# Patient Record
Sex: Male | Born: 1950 | Race: White | Hispanic: No | Marital: Single | Smoking: Never smoker
Health system: Southern US, Community
[De-identification: ages and names within clinical notes are randomized; demographics above are authoritative.]

## PROBLEM LIST (undated history)

## (undated) DIAGNOSIS — M1712 Unilateral primary osteoarthritis, left knee: Secondary | ICD-10-CM

## (undated) DIAGNOSIS — I10 Essential (primary) hypertension: Secondary | ICD-10-CM

## (undated) DIAGNOSIS — K635 Polyp of colon: Secondary | ICD-10-CM

## (undated) DIAGNOSIS — E785 Hyperlipidemia, unspecified: Secondary | ICD-10-CM

## (undated) DIAGNOSIS — I6350 Cerebral infarction due to unspecified occlusion or stenosis of unspecified cerebral artery: Secondary | ICD-10-CM

## (undated) DIAGNOSIS — J302 Other seasonal allergic rhinitis: Secondary | ICD-10-CM

## (undated) DIAGNOSIS — H269 Unspecified cataract: Secondary | ICD-10-CM

## (undated) DIAGNOSIS — K625 Hemorrhage of anus and rectum: Secondary | ICD-10-CM

## (undated) DIAGNOSIS — J45909 Unspecified asthma, uncomplicated: Secondary | ICD-10-CM

## (undated) DIAGNOSIS — M1990 Unspecified osteoarthritis, unspecified site: Secondary | ICD-10-CM

## (undated) DIAGNOSIS — K5790 Diverticulosis of intestine, part unspecified, without perforation or abscess without bleeding: Secondary | ICD-10-CM

## (undated) DIAGNOSIS — E119 Type 2 diabetes mellitus without complications: Secondary | ICD-10-CM

## (undated) DIAGNOSIS — Z9889 Other specified postprocedural states: Secondary | ICD-10-CM

## (undated) HISTORY — DX: Hyperlipidemia, unspecified: E78.5

## (undated) HISTORY — DX: Essential (primary) hypertension: I10

## (undated) HISTORY — DX: Unspecified cataract: H26.9

## (undated) HISTORY — DX: Hemorrhage of anus and rectum: K62.5

## (undated) HISTORY — PX: EYE SURGERY: SHX253

## (undated) HISTORY — PX: COLONOSCOPY: SHX174

## (undated) HISTORY — DX: Type 2 diabetes mellitus without complications: E11.9

## (undated) HISTORY — DX: Other seasonal allergic rhinitis: J30.2

## (undated) HISTORY — PX: KNEE SURGERY: SHX244

## (undated) HISTORY — DX: Diverticulosis of intestine, part unspecified, without perforation or abscess without bleeding: K57.90

## (undated) HISTORY — DX: Unspecified asthma, uncomplicated: J45.909

## (undated) HISTORY — DX: Polyp of colon: K63.5

## (undated) HISTORY — DX: Cerebral infarction due to unspecified occlusion or stenosis of unspecified cerebral artery: I63.50

## (undated) HISTORY — PX: CATARACT EXTRACTION, BILATERAL: SHX1313

## (undated) HISTORY — PX: FRACTURE SURGERY: SHX138

---

## 2004-07-31 ENCOUNTER — Ambulatory Visit: Payer: Self-pay | Admitting: Endocrinology

## 2004-08-10 ENCOUNTER — Ambulatory Visit: Payer: Self-pay | Admitting: Endocrinology

## 2005-08-18 ENCOUNTER — Ambulatory Visit: Payer: Self-pay | Admitting: Endocrinology

## 2005-09-17 ENCOUNTER — Ambulatory Visit: Payer: Self-pay | Admitting: Endocrinology

## 2005-10-10 DIAGNOSIS — I6350 Cerebral infarction due to unspecified occlusion or stenosis of unspecified cerebral artery: Secondary | ICD-10-CM

## 2005-10-10 HISTORY — DX: Cerebral infarction due to unspecified occlusion or stenosis of unspecified cerebral artery: I63.50

## 2005-10-11 ENCOUNTER — Inpatient Hospital Stay (HOSPITAL_COMMUNITY): Admission: EM | Admit: 2005-10-11 | Discharge: 2005-10-13 | Payer: Self-pay | Admitting: Emergency Medicine

## 2005-10-11 ENCOUNTER — Ambulatory Visit (HOSPITAL_COMMUNITY): Admission: RE | Admit: 2005-10-11 | Discharge: 2005-10-11 | Payer: Self-pay | Admitting: Family Medicine

## 2005-10-12 ENCOUNTER — Encounter (INDEPENDENT_AMBULATORY_CARE_PROVIDER_SITE_OTHER): Payer: Self-pay | Admitting: Neurology

## 2005-10-12 ENCOUNTER — Ambulatory Visit: Payer: Self-pay | Admitting: Internal Medicine

## 2005-10-12 ENCOUNTER — Encounter: Payer: Self-pay | Admitting: Cardiovascular Disease

## 2005-10-12 ENCOUNTER — Ambulatory Visit: Payer: Self-pay | Admitting: Cardiovascular Disease

## 2005-10-12 HISTORY — PX: TRANSTHORACIC ECHOCARDIOGRAM: SHX275

## 2005-10-18 ENCOUNTER — Ambulatory Visit: Payer: Self-pay | Admitting: Internal Medicine

## 2005-10-27 ENCOUNTER — Ambulatory Visit: Payer: Self-pay | Admitting: Endocrinology

## 2005-11-05 ENCOUNTER — Ambulatory Visit: Payer: Self-pay

## 2005-11-05 HISTORY — PX: OTHER SURGICAL HISTORY: SHX169

## 2005-12-06 ENCOUNTER — Ambulatory Visit: Payer: Self-pay | Admitting: Pulmonary Disease

## 2006-08-19 ENCOUNTER — Ambulatory Visit: Payer: Self-pay | Admitting: Endocrinology

## 2006-08-19 LAB — CONVERTED CEMR LAB
AST: 23 units/L (ref 0–37)
Basophils Absolute: 0 10*3/uL (ref 0.0–0.1)
Basophils Relative: 0.3 % (ref 0.0–1.0)
Bilirubin, Direct: 0.1 mg/dL (ref 0.0–0.3)
CO2: 27 meq/L (ref 19–32)
Chloride: 106 meq/L (ref 96–112)
Cholesterol: 112 mg/dL (ref 0–200)
Creatinine, Ser: 1 mg/dL (ref 0.4–1.5)
Eosinophils Relative: 6.6 % — ABNORMAL HIGH (ref 0.0–5.0)
Glucose, Bld: 114 mg/dL — ABNORMAL HIGH (ref 70–99)
HCT: 37.4 % — ABNORMAL LOW (ref 39.0–52.0)
HDL: 38.8 mg/dL — ABNORMAL LOW (ref >39.0)
Hemoglobin, Urine: NEGATIVE
Hemoglobin: 12.8 g/dL — ABNORMAL LOW (ref 13.0–17.0)
Leukocytes, UA: NEGATIVE
MCHC: 34.3 g/dL (ref 30.0–36.0)
Monocytes Absolute: 1 10*3/uL — ABNORMAL HIGH (ref 0.2–0.7)
Neutrophils Relative %: 57.6 % (ref 43.0–77.0)
Nitrite: NEGATIVE
PSA: 0.96 ng/mL (ref 0.10–4.00)
Potassium: 4.5 meq/L (ref 3.5–5.1)
RBC: 3.85 M/uL — ABNORMAL LOW (ref 4.22–5.81)
RDW: 12.5 % (ref 11.5–14.6)
Sodium: 141 meq/L (ref 135–145)
TSH: 2.37 microintl units/mL (ref 0.35–5.50)
Total Bilirubin: 0.6 mg/dL (ref 0.3–1.2)
Total Protein: 7.1 g/dL (ref 6.0–8.3)
Urobilinogen, UA: 0.2 (ref 0.0–1.0)
VLDL: 10 mg/dL (ref 0–40)
WBC: 7.1 10*3/uL (ref 4.5–10.5)
pH: 6 (ref 5.0–8.0)

## 2006-08-24 ENCOUNTER — Ambulatory Visit: Payer: Self-pay | Admitting: Endocrinology

## 2006-08-24 LAB — CONVERTED CEMR LAB
Basophils Absolute: 0 10*3/uL (ref 0.0–0.1)
Basophils Relative: 0 % (ref 0.0–1.0)
Hemoglobin: 12.4 g/dL — ABNORMAL LOW (ref 13.0–17.0)
Hgb A1c MFr Bld: 6.4 % — ABNORMAL HIGH (ref 4.6–6.0)
Iron: 87 ug/dL (ref 42–165)
MCHC: 34 g/dL (ref 30.0–36.0)
Monocytes Absolute: 0.9 10*3/uL — ABNORMAL HIGH (ref 0.2–0.7)
Monocytes Relative: 14 % — ABNORMAL HIGH (ref 3.0–11.0)
Platelets: 325 10*3/uL (ref 150–400)
RDW: 12.8 % (ref 11.5–14.6)
Transferrin: 316.7 mg/dL (ref >=212.0)
Vitamin B-12: 281 pg/mL (ref 211–911)

## 2006-10-08 ENCOUNTER — Encounter: Payer: Self-pay | Admitting: Endocrinology

## 2006-10-08 DIAGNOSIS — I10 Essential (primary) hypertension: Secondary | ICD-10-CM

## 2006-10-08 DIAGNOSIS — E119 Type 2 diabetes mellitus without complications: Secondary | ICD-10-CM

## 2006-10-08 DIAGNOSIS — J45909 Unspecified asthma, uncomplicated: Secondary | ICD-10-CM

## 2006-10-08 DIAGNOSIS — E785 Hyperlipidemia, unspecified: Secondary | ICD-10-CM

## 2006-10-08 HISTORY — DX: Hyperlipidemia, unspecified: E78.5

## 2006-10-08 HISTORY — DX: Unspecified asthma, uncomplicated: J45.909

## 2006-10-08 HISTORY — DX: Essential (primary) hypertension: I10

## 2006-10-08 HISTORY — DX: Type 2 diabetes mellitus without complications: E11.9

## 2007-08-14 ENCOUNTER — Encounter: Payer: Self-pay | Admitting: Endocrinology

## 2007-08-21 ENCOUNTER — Ambulatory Visit: Payer: Self-pay | Admitting: Endocrinology

## 2007-08-23 LAB — CONVERTED CEMR LAB
ALT: 19 units/L (ref 0–53)
AST: 19 units/L (ref 0–37)
Albumin: 3.8 g/dL (ref 3.5–5.2)
Alkaline Phosphatase: 69 units/L (ref 39–117)
BUN: 13 mg/dL (ref 6–23)
Basophils Relative: 0.7 % (ref 0.0–1.0)
CO2: 28 meq/L (ref 19–32)
Chloride: 111 meq/L (ref 96–112)
Creatinine, Ser: 1.1 mg/dL (ref 0.4–1.5)
Eosinophils Relative: 6.3 % — ABNORMAL HIGH (ref 0.0–5.0)
Glucose, Bld: 115 mg/dL — ABNORMAL HIGH (ref 70–99)
HCT: 37.4 % — ABNORMAL LOW (ref 39.0–52.0)
HDL: 36.4 mg/dL — ABNORMAL LOW (ref >39.0)
MCV: 96.8 fL (ref 78.0–100.0)
Monocytes Relative: 12 % (ref 3.0–12.0)
Neutrophils Relative %: 57.6 % (ref 43.0–77.0)
PSA: 0.82 ng/mL (ref 0.10–4.00)
Platelets: 294 10*3/uL (ref 150–400)
Potassium: 4.3 meq/L (ref 3.5–5.1)
RBC: 3.86 M/uL — ABNORMAL LOW (ref 4.22–5.81)
Total CHOL/HDL Ratio: 4.1
Total Protein: 7.1 g/dL (ref 6.0–8.3)
VLDL: 8 mg/dL (ref 0–40)
WBC: 6.4 10*3/uL (ref 4.5–10.5)

## 2007-08-28 ENCOUNTER — Ambulatory Visit: Payer: Self-pay | Admitting: Endocrinology

## 2007-09-04 ENCOUNTER — Telehealth (INDEPENDENT_AMBULATORY_CARE_PROVIDER_SITE_OTHER): Payer: Self-pay | Admitting: *Deleted

## 2007-09-13 ENCOUNTER — Ambulatory Visit: Payer: Self-pay | Admitting: Internal Medicine

## 2007-09-25 ENCOUNTER — Ambulatory Visit: Payer: Self-pay | Admitting: Internal Medicine

## 2007-09-25 ENCOUNTER — Encounter: Payer: Self-pay | Admitting: Internal Medicine

## 2007-09-26 ENCOUNTER — Encounter: Payer: Self-pay | Admitting: Internal Medicine

## 2007-12-06 ENCOUNTER — Telehealth (INDEPENDENT_AMBULATORY_CARE_PROVIDER_SITE_OTHER): Payer: Self-pay | Admitting: *Deleted

## 2008-01-18 ENCOUNTER — Ambulatory Visit: Payer: Self-pay | Admitting: Endocrinology

## 2008-09-02 ENCOUNTER — Ambulatory Visit: Payer: Self-pay | Admitting: Endocrinology

## 2008-09-02 ENCOUNTER — Telehealth: Payer: Self-pay | Admitting: Internal Medicine

## 2008-09-03 LAB — CONVERTED CEMR LAB
ALT: 19 units/L (ref 0–53)
AST: 24 units/L (ref 0–37)
BUN: 24 mg/dL — ABNORMAL HIGH (ref 6–23)
Basophils Relative: 0.8 % (ref 0.0–3.0)
Bilirubin, Direct: 0.1 mg/dL (ref 0.0–0.3)
CO2: 25 meq/L (ref 19–32)
Calcium: 9.1 mg/dL (ref 8.4–10.5)
Creatinine, Ser: 1.2 mg/dL (ref 0.4–1.5)
Eosinophils Relative: 4.6 % (ref 0.0–5.0)
Glucose, Bld: 114 mg/dL — ABNORMAL HIGH (ref 70–99)
HCT: 38 % — ABNORMAL LOW (ref 39.0–52.0)
HDL: 31.4 mg/dL — ABNORMAL LOW (ref >39.00)
Ketones, ur: NEGATIVE mg/dL
Lymphs Abs: 1.7 10*3/uL (ref 0.7–4.0)
Monocytes Relative: 14.1 % — ABNORMAL HIGH (ref 3.0–12.0)
Platelets: 232 10*3/uL (ref 150.0–400.0)
RBC: 3.95 M/uL — ABNORMAL LOW (ref 4.22–5.81)
Sodium: 143 meq/L (ref 135–145)
Specific Gravity, Urine: >=1.03 (ref 1.000–1.030)
TSH: 1.88 microintl units/mL (ref 0.35–5.50)
Total Bilirubin: 0.6 mg/dL (ref 0.3–1.2)
Total CHOL/HDL Ratio: 4
Urine Glucose: NEGATIVE mg/dL
WBC: 5.8 10*3/uL (ref 4.5–10.5)
pH: 6 (ref 5.0–8.0)

## 2008-09-10 ENCOUNTER — Ambulatory Visit: Payer: Self-pay | Admitting: Endocrinology

## 2008-09-12 ENCOUNTER — Encounter: Payer: Self-pay | Admitting: Endocrinology

## 2008-12-25 ENCOUNTER — Telehealth: Payer: Self-pay | Admitting: Endocrinology

## 2009-07-18 ENCOUNTER — Encounter: Payer: Self-pay | Admitting: Endocrinology

## 2009-08-14 ENCOUNTER — Telehealth: Payer: Self-pay | Admitting: Endocrinology

## 2009-08-15 ENCOUNTER — Telehealth: Payer: Self-pay | Admitting: Endocrinology

## 2009-08-26 ENCOUNTER — Encounter: Payer: Self-pay | Admitting: Endocrinology

## 2009-09-08 ENCOUNTER — Ambulatory Visit: Payer: Self-pay | Admitting: Endocrinology

## 2009-09-08 LAB — CONVERTED CEMR LAB
AST: 24 units/L (ref 0–37)
Albumin: 4.2 g/dL (ref 3.5–5.2)
Basophils Absolute: 0 10*3/uL (ref 0.0–0.1)
Bilirubin Urine: NEGATIVE
CO2: 28 meq/L (ref 19–32)
Chloride: 107 meq/L (ref 96–112)
Creatinine,U: 177.6 mg/dL
Eosinophils Absolute: 0.3 10*3/uL (ref 0.0–0.7)
Glucose, Bld: 121 mg/dL — ABNORMAL HIGH (ref 70–99)
HCT: 40.3 % (ref 39.0–52.0)
HDL: 33.2 mg/dL — ABNORMAL LOW (ref >39.00)
Hemoglobin: 14 g/dL (ref 13.0–17.0)
Hgb A1c MFr Bld: 6.3 % (ref 4.6–6.5)
Lymphs Abs: 1.9 10*3/uL (ref 0.7–4.0)
MCHC: 34.8 g/dL (ref 30.0–36.0)
Microalb Creat Ratio: 0.3 mg/g (ref 0.0–30.0)
Microalb, Ur: 0.5 mg/dL (ref 0.0–1.9)
Monocytes Relative: 11.8 % (ref 3.0–12.0)
Neutro Abs: 3.9 10*3/uL (ref 1.4–7.7)
Nitrite: NEGATIVE
Potassium: 4.7 meq/L (ref 3.5–5.1)
RDW: 13.2 % (ref 11.5–14.6)
Sodium: 140 meq/L (ref 135–145)
Specific Gravity, Urine: 1.025 (ref 1.000–1.030)
TSH: 2.48 microintl units/mL (ref 0.35–5.50)
Total Protein, Urine: NEGATIVE mg/dL
pH: 5.5 (ref 5.0–8.0)

## 2009-09-15 ENCOUNTER — Encounter: Payer: Self-pay | Admitting: Endocrinology

## 2009-09-15 ENCOUNTER — Ambulatory Visit: Payer: Self-pay | Admitting: Endocrinology

## 2009-09-15 ENCOUNTER — Encounter (INDEPENDENT_AMBULATORY_CARE_PROVIDER_SITE_OTHER): Payer: Self-pay | Admitting: *Deleted

## 2009-09-15 DIAGNOSIS — K625 Hemorrhage of anus and rectum: Secondary | ICD-10-CM

## 2009-09-15 HISTORY — DX: Hemorrhage of anus and rectum: K62.5

## 2009-09-17 ENCOUNTER — Telehealth: Payer: Self-pay | Admitting: Endocrinology

## 2009-11-11 ENCOUNTER — Telehealth: Payer: Self-pay | Admitting: Endocrinology

## 2009-11-11 DIAGNOSIS — I6350 Cerebral infarction due to unspecified occlusion or stenosis of unspecified cerebral artery: Secondary | ICD-10-CM | POA: Insufficient documentation

## 2009-11-12 ENCOUNTER — Encounter: Payer: Self-pay | Admitting: Endocrinology

## 2010-03-11 NOTE — Medication Information (Signed)
Summary: Approved Actos / [employer]  Approved Actos / [employer]aked[employer]   Imported By: Lennie Odor 08/27/2009 11:07:11  _____________________________________________________________________  External Attachment:    Type:   Image     Comment:   External Document

## 2010-03-11 NOTE — Medication Information (Signed)
Summary: [employer]   Imported By: Sherian Rein 08/18/2009 11:45:39  _____________________________________________________________________  External Attachment:    Type:   Image     Comment:   External Document

## 2010-03-11 NOTE — Progress Notes (Signed)
Summary: Statement  Phone Note Call from Patient Call back at Home Phone [phone]   Caller: Patient Summary of Call: Pt is applying for Health Insurance and company has requested a statement from MD that last saw pt verifying medical Dx; Asthma, DM, HTN and stroke. Initial call taken by: Margaret Pyle, CMA,  November 11, 2009 9:59 AM  Follow-up for Phone Call        done Follow-up by: Minus Breeding MD,  November 12, 2009 8:01 AM  Additional Follow-up for Phone Call Additional follow up Details #1::        Pt advised, Letter up front for pt pick up Additional Follow-up by: Margaret Pyle, CMA,  November 12, 2009 8:15 AM  New Problems: CVA (ICD-434.91)   New Problems: CVA (ICD-434.91)

## 2010-03-11 NOTE — Progress Notes (Signed)
Summary: ALT med  Phone Note Call from Patient Call back at Home Phone [phone]   Caller: Patient Summary of Call: Pt called stating that Actos is too expensive to pay out of pocket since he has lost his health Insurance. Pt is requesting alternate medication or generic? please advise. Initial call taken by: Margaret Pyle, CMA,  August 15, 2009 11:00 AM  Follow-up for Phone Call        i has been a while since last ov.  ov would be necessary to advise you.  cheaper meds can often be prescribed Follow-up by: Minus Breeding MD,  August 15, 2009 12:45 PM  Additional Follow-up for Phone Call Additional follow up Details #1::        Pt advised and stated that he has appt scheduled for Aug 8th Additional Follow-up by: Margaret Pyle, CMA,  August 15, 2009 1:40 PM

## 2010-03-11 NOTE — Letter (Signed)
Summary: New Patient letter  [facility]  [address] [place], [place] [address]   Phone: [phone]  Fax: [phone]       09/15/2009 MRN: [id number]  [facility] [address] [place], [place]  [address]  Dear Mr. Lineman,  Welcome to the Gastroenterology Division at [facility].    You are scheduled to see Dr.  Marina Goodell on 11-04-09 at 11:00a.m. on the 3rd floor at [facility], [address]  We ask that you try to arrive at our office 15 minutes prior to your appointment time to allow for check-in.  We would like you to complete the enclosed self-administered evaluation form prior to your visit and bring it with you on the day of your appointment.  We will review it with you.  Also, please bring a complete list of all your medications or, if you prefer, bring the medication bottles and we will list them.  Please bring your insurance card so that we may make a copy of it.  If your insurance requires a referral to see a specialist, please bring your referral form from your primary care physician.  Co-payments are due at the time of your visit and may be paid by cash, check or credit card.     Your office visit will consist of a consult with your physician (includes a physical exam), any laboratory testing he/she may order, scheduling of any necessary diagnostic testing (e.g. x-ray, ultrasound, CT-scan), and scheduling of a procedure (e.g. Endoscopy, Colonoscopy) if required.  Please allow enough time on your schedule to allow for any/all of these possibilities.    If you cannot keep your appointment, please call [phone] to cancel or reschedule prior to your appointment date.  This allows [place] the opportunity to schedule an appointment for another patient in need of care.  If you do not cancel or reschedule by 5 p.m. the business day prior to your appointment date, you will be charged a $50.00 late cancellation/no-show fee.    Thank you for  choosing [facility] for your medical needs.  We appreciate the opportunity to care for you.  Please visit [place] at our website  to learn more about our practice.                     Sincerely,                                                             The Gastroenterology Division

## 2010-03-11 NOTE — Assessment & Plan Note (Signed)
Summary: CPX/ NWS  #   Vital Signs:  Patient profile:   60 year old male Height:      70 inches (177.80 cm) Weight:      242 pounds (110.00 kg) BMI:     34.85 O2 Sat:      97 % on Room air Temp:     99.6 degrees F (37.56 degrees C) oral Pulse rate:   86 / minute BP sitting:   128 / 78  (left arm) Cuff size:   large  Vitals Entered By: Brenton Grills MA (September 15, 2009 8:11 AM)  O2 Flow:  Room air CC: Physical/aj Is Patient Diabetic? Yes   CC:  Physical/aj.  History of Present Illness: here for regular wellness examination.  He's feeling pretty well in general, and does not drink or smoke.   Current Medications (verified): 1)  Accolate 20 Mg  Tabs (Zafirlukast) .... Take 1 By Mouth Two Times A Day Qd 2)  Actos 15 Mg  Tabs (Pioglitazone Hcl) .... Take 1 By Mouth Qd 3)  Cardizem Cd 120 Mg  Cp24 (Diltiazem Hcl Coated Beads) .... Take 1 By Mouth Qd 4)  Aspirin 325 Mg  Tbec (Aspirin) .... Take 1 By Mouth Qd 5)  Zocor 80 Mg  Tabs (Simvastatin) .... Qhs 6)  Advair Diskus 250-50 Mcg/dose  Misc (Fluticasone-Salmeterol) .... Bid 7)  Cozaar 100 Mg Tabs (Losartan Potassium) .Marland Kitchen.. 1 Qd  Allergies (verified): No Known Drug Allergies  Family History: Reviewed history from 08/28/2007 and no changes required. brother has lung cancer dtr has breast cancer  Social History: Reviewed history from 08/28/2007 and no changes required. works at [employer] single  Review of Systems       The patient complains of weight gain.  The patient denies fever, vision loss, decreased hearing, chest pain, syncope, dyspnea on exertion, prolonged cough, headaches, abdominal pain, melena, severe indigestion/heartburn, hematuria, suspicious skin lesions, and depression.    Physical Exam  General:  obese.  no distress  Head:  head: no deformity eyes: no periorbital swelling, no proptosis external nose and ears are normal mouth: no lesion seen Neck:  Supple without thyroid enlargement or tenderness.    Lungs:  Clear to auscultation bilaterally. Normal respiratory effort.  Abdomen:  abdomen is soft, nontender.  no hepatosplenomegaly.   not distended.  no hernia  Prostate:  Normal size prostate without masses or tenderness.  Msk:  muscle bulk and strength are grossly normal.  no obvious joint swelling.  gait is normal and steady  Pulses:  dorsalis pedis intact bilat.  no carotid bruit  Extremities:  no deformity.  no ulcer on the feet.  feet are of normal color and temp.  no edema  Neurologic:  cn 2-12 grossly intact.   readily moves all 4's.   sensation is intact to touch on the feet  Skin:  normal texture and temp.  no rash.  not diaphoretic  Cervical Nodes:  No significant adenopathy.  Psych:  Alert and cooperative; normal mood and affect; normal attention span and concentration.   Additional Exam:  SEPARATE EVALUATION FOLLOWS--EACH PROBLEM HERE IS NEW, NOT RESPONDING TO TREATMENT, OR POSES SIGNIFICANT RISK TO THE PATIENT'S HEALTH: HISTORY OF THE PRESENT ILLNESS: heme pos stool is noted today. pt takes cardizem and zocor. PAST MEDICAL HISTORY reviewed and up to date today REVIEW OF SYSTEMS: denies brbpr PHYSICAL EXAMINATION: see vs page reg rate and rhythm. no murmur stool is heme pos LAB/XRAY RESULTS: Hemoglobin  14.0 g/dL                   16.1-09.6 Hematocrit                40.3 %                      39.0-52.0 IMPRESSION: drug interaction between cardizem and zocor heme pos stool PLAN: see instruction sheet.   Impression & Recommendations:  Problem # 1:  ROUTINE GENERAL MEDICAL EXAM@HEALTH  CARE FACL (ICD-V70.0)  Medications Added to Medication List This Visit: 1)  Accolate 20 Mg Tabs (Zafirlukast) .... Take 1 by mouth two times a day 2)  Diltiazem Hcl 60 Mg Tabs (Diltiazem hcl) .Marland Kitchen.. 1 tab two times a day 3)  Simvastatin 40 Mg Tabs (Simvastatin) .Marland Kitchen.. 1 once daily 4)  Simvastatin 10 Mg Tabs (Simvastatin) .Marland Kitchen.. 1 tab at bedtime  Other Orders: EKG  w/ Interpretation (93000) Gastroenterology Referral (GI) Est. Patient Level III ([address]) Est. Patient 40-64 years ([address])  Patient Instructions: 1)  here are some samples of "symbicort"-160 (similar to advair) 2)  simvastatin is cheapest a [facility] 3)  cozaar is cheapest at [facility]. 4)  change diltiazem to immediate-release 60 mg two times a day 5)  reduce simvastatin to 40 mg once daily 6)  please consider these measures for your health:  minimize alcohol.  do not use tobacco products.  have a colonoscopy at least every 10 years from age 81.  keep firearms safely stored.  always use seat belts.  have working smoke alarms in your home.  see an eye doctor and dentist regularly.  never drive under the influence of alcohol or drugs (including prescription drugs).  those with fair skin should take precautions against the sun. 7)  please let me know what your wishes would be, if artificial life support measures should become necessary.   it is critically important to prevent falling down (keep floor areas well-lit, dry, and free of loose objects). 8)  Please schedule a follow-up appointment in 6 months. 9)  refer back to dr Marina Goodell.  you will be called with a day and time for an appointment 10)  (update:  we discussed code status.  pt requests full code, but would not want to be started or maintained on artificial life-support measures if there was not a reasonable chance of recovery). Prescriptions: SIMVASTATIN 10 MG TABS (SIMVASTATIN) 1 tab at bedtime  #30 x 11   Entered and Authorized by:   Minus Breeding MD   Signed by:   Minus Breeding MD on 09/17/2009   Method used:   Electronically to        [facility] [address] (retail)       [phone]       [place], [place]  [address]       Ph: [phone]       Fax: [phone]   RxID:   [id number] ACCOLATE 20 MG  TABS (ZAFIRLUKAST) take 1 by mouth two times a day  #180 x 3   Entered and Authorized by:    Minus Breeding MD   Signed by:   Minus Breeding MD on 09/15/2009   Method used:   Print then Give to Patient   RxID:   [id number] SIMVASTATIN 40 MG TABS (SIMVASTATIN) 1 once daily  #90 x 3   Entered and Authorized by:   Minus Breeding  MD   Signed by:   Minus Breeding MD on 09/15/2009   Method used:   Print then Give to Patient   RxID:   [id number] DILTIAZEM HCL 60 MG TABS (DILTIAZEM HCL) 1 tab two times a day  #180 x 3   Entered and Authorized by:   Minus Breeding MD   Signed by:   Minus Breeding MD on 09/15/2009   Method used:   Print then Give to Patient   RxID:   [id number] COZAAR 100 MG TABS (LOSARTAN POTASSIUM) 1 qd  #90 x 3   Entered and Authorized by:   Minus Breeding MD   Signed by:   Minus Breeding MD on 09/15/2009   Method used:   Print then Give to Patient   RxID:   [id number] ADVAIR DISKUS 250-50 MCG/DOSE  MISC (FLUTICASONE-SALMETEROL) bid  #3 x 3   Entered and Authorized by:   Minus Breeding MD   Signed by:   Minus Breeding MD on 09/15/2009   Method used:   Print then Give to Patient   RxID:   [id number] ACTOS 15 MG  TABS (PIOGLITAZONE HCL) take 1 by mouth qd  #90 x 3   Entered and Authorized by:   Minus Breeding MD   Signed by:   Minus Breeding MD on 09/15/2009   Method used:   Print then Give to Patient   RxID:   [id number]

## 2010-03-11 NOTE — Medication Information (Signed)
Summary: Actos/[employer]  Actos/[employer]aked[employer]   Imported By: Sherian Rein 08/18/2009 11:48:43  _____________________________________________________________________  External Attachment:    Type:   Image     Comment:   External Document

## 2010-03-11 NOTE — Letter (Signed)
Summary: Generic Letter  [facility]  [address] [place], [place] [address]   Phone: [phone]  Fax: [phone]    11/12/2009  [facility] [address] [place], [place]  [address]  Dear Mr. Cipriani,   This is to verify that I last saw you in the office on September 15, 2009.  You have a medical history remarkable for diabetes, cerebrovascular accident, hypertension, asthma, and dyslipidemia.     Sincerely,   Romero Belling MD

## 2010-03-11 NOTE — Progress Notes (Signed)
Summary: Simvastatin  Phone Note Outgoing Call   Call placed by: Brenton Grills MA,  September 17, 2009 2:31 PM Call placed to: Patient Details for Reason: Medication Change Summary of Call: Pt informed about dosage change to Simvastatin due to interaction with Diltiazem. Pt is aware of new dosage (10mg ) and that new rx was sent in.

## 2010-03-11 NOTE — Medication Information (Signed)
Summary: Accolate/[employer]  Accolate/[employer]   Imported By: Sherian Rein 08/18/2009 11:47:09  _____________________________________________________________________  External Attachment:    Type:   Image     Comment:   External Document

## 2010-03-11 NOTE — Progress Notes (Signed)
Summary: Refills  Phone Note Outgoing Call   Summary of Call: Patient notified that MD will not sign paperwork for Lipitor and Accupril. Patient was trying to receive those drugs due having been laid off and having an indemnity plan that does not cover prescriptions. Per request will send patient prescriptions to [facility] where he could possibly get them cheaper. Initial call taken by: Lucious Groves,  August 14, 2009 2:58 PM    Prescriptions: COZAAR 100 MG TABS (LOSARTAN POTASSIUM) 1 qd  #100 x 0   Entered by:   Lucious Groves   Authorized by:   Minus Breeding MD   Signed by:   Lucious Groves on 08/14/2009   Method used:   Faxed to ...       [facility] (retail)       [address]       [place], [place]  [address]       Ph: [phone]       Fax: [phone]   RxID:   [id number] ZOCOR 80 MG  TABS (SIMVASTATIN) qhs  #100 x 0   Entered by:   Lucious Groves   Authorized by:   Minus Breeding MD   Signed by:   Lucious Groves on 08/14/2009   Method used:   Faxed to ...       [facility] (retail)       [address]       [place], [place]  [id number]       Ph: [phone]       Fax: [phone]   RxID:   [id number] ACTOS 15 MG  TABS (PIOGLITAZONE HCL) take 1 by mouth qd  #100 x 0   Entered by:   Lucious Groves   Authorized by:   Minus Breeding MD   Signed by:   Lucious Groves on 08/14/2009   Method used:   Faxed to ...       [facility] (retail)       [address]       [place], [place]  [address]       Ph: [phone]       Fax: [phone]   RxID:   [id number]

## 2010-05-01 ENCOUNTER — Other Ambulatory Visit: Payer: Self-pay | Admitting: Endocrinology

## 2010-05-01 DIAGNOSIS — E785 Hyperlipidemia, unspecified: Secondary | ICD-10-CM

## 2010-06-17 ENCOUNTER — Other Ambulatory Visit: Payer: Self-pay | Admitting: *Deleted

## 2010-06-17 MED ORDER — PIOGLITAZONE HCL 15 MG PO TABS: 15.0000 mg | ORAL_TABLET | Freq: Every day | ORAL | Status: DC

## 2010-06-17 NOTE — Telephone Encounter (Signed)
Pt left message on triage requesting refill of Actos

## 2010-06-18 ENCOUNTER — Other Ambulatory Visit: Payer: Self-pay

## 2010-06-18 MED ORDER — ZAFIRLUKAST 20 MG PO TABS: 20.0000 mg | ORAL_TABLET | Freq: Two times a day (BID) | ORAL | Status: DC

## 2010-06-26 NOTE — H&P (Signed)
NAME:  EDEN, RHO         ACCOUNT NO.:  [id number]   MEDICAL RECORD NO.:  [id number]          PATIENT TYPE:  EMS   LOCATION:  [facility]                         FACILITY:  [facility]   PHYSICIAN:  Barbette Hair. Artist Pais, DO      DATE OF BIRTH:  [birth year]   DATE OF ADMISSION:  10/11/2005  DATE OF DISCHARGE:                                HISTORY & PHYSICAL   CHIEF COMPLAINT:  Right-sided weakness.   HISTORY OF PRESENT ILLNESS:  The patient is a 60 year old white, right-  handed male with past medical history of type 2 diabetes, hypertension, and  dyslipidemia who presents with right-sided weakness.  His symptoms started  while working Friday afternoon.  The patient delivers snack foods and  noticed a decreased ability writing with his right hand.  The patient was  not overly concerned and eventually sought evaluation at [facility] today and was advised to obtain an urgent CT scan of the head.  CT  of the head performed at [facility] showed a small hypodensity in the left  mid frontal opercular region with extension into the left mid corona  radiata, acute versus subacute.  The patient did not notice any slurred  speech or facial droop.  His girlfriend noticed the patient may have been  dragging his right leg/foot.  The patient denies any other associated  symptoms.  No chest pain, no shortness of breath.  He was in his usual state  of health prior to this episode.  No GI symptoms.  No headache, no changes  in vision.   The patient states that his diabetes has been fairly well controlled.  He  follows up with Dr. Everardo All regularly.   PAST MEDICAL HISTORY:  1. Type 2 diabetes 3-4 years.  2. Hypertension.  3. Dyslipidemia.  4. Asthma.  5. History of laser eye surgery.   SOCIAL HISTORY:  The patient is divorced, lives with his girlfriend.  His  daughter is accompanying him today.  He works delivering snack foods.  Denies any history of tobacco use, no alcohol use.   FAMILY HISTORY:  Positive for type 2 diabetes in mother and father.  His  father is deceased, died in his 32s.  Father was also hypotensive.  Mother  is alive at age 38, has a history of CHF and knee replacement.  His older  brother is known to have lung cancer.   CURRENT MEDICATIONS:  1. Diovan 320 mg once a day.  2. Actos unknown dose.  3. Advair 5/50 one dose daily.  4. Accolate 20 mg b.i.d.  5. Lovastatin unknown dose.   ALLERGIES:  NONE KNOWN.   LABORATORY DATA:  CBC:  WBC 10.2, hemoglobin 14.5, hematocrit 42.6,  platelets 311, PT 13.6, INR 1.  Comprehensive metabolic profile showed  sodium 138, potassium 3.8, chloride 106, CO2 26, BUN 12, creatinine 0.9,  blood sugar 91.  LFTs were normal.  The patient had normal CPK but mildly  elevated troponin I of 0.3, urinalysis was unremarkable.   REVIEW OF SYSTEMS:  As noted above, all other systems negative.   PHYSICAL EXAMINATION:  VITAL SIGNS:  Temperature 98.3, pulse 77,  respirations 16, blood pressure 165/91.  GENERAL:  The patient is a very pleasant 60 year old white male in no  apparent distress, normal speech, no facial droop.  HEENT:  Normocephalic, atraumatic, pupils were equal and reactive to light  bilaterally, extraocular motility was intact, the patient was anicteric,  normal conjunctivae.  The patient had a corneal abnormality in his right  eye.  NECK:  Supple, no adenopathy, no carotid bruit, no thyromegaly or thyroid  nodules.  CHEST:  Normal respiratory effort.  Chest was clear to auscultation  bilaterally, no rhonchi, rales, or wheezing.  CARDIOVASCULAR:  Regular rate and rhythm, no significant murmurs, rubs, or  gallops appreciated.  ABDOMEN:  Soft, nontender, positive bowel sounds, no organomegaly.  EXTREMITIES:  No clubbing, cyanosis, or edema.  SKIN:  Warm and dry.  The patient had intact pedis dorsalis pulses that were  slightly diminished bilaterally.  NEUROLOGICAL:  Cranial nerves II-XII was grossly  intact.  The patient had  decreased right hand grip, slightly diminished strength of his right hip  flexors.  Reflexes were 1-2 right upper extremity and 0-1 left upper  extremity.  The patient's patellar bilaterally was +2 to 3.  No Babinski.  No cerebellar signs.  No pronator drift.   IMPRESSION/RECOMMENDATIONS:  1. Right-sided weakness, subacute cerebrovascular accident.  2. Mildly elevated troponin.  3. Hypertension.  4. Type 2 diabetes.  5. Dyslipidemia.  6. History of asthma.   RECOMMENDATIONS:  The patient likely had a lacunar stroke and we will  initiate stroke workup including MRI of the brain, carotid Doppler, and 2D  echo.  Check a homocystine.  Repeat his fasting lipid profile and hemoglobin  A1c.  He will be started on aspirin 325 mg once a day but likely he will  need to be transitioned to Aggrenox before discharge.  In addition, if the  fasting lipid profile is suboptimal, we will change Statin to Lipitor 80 mg  p.o. nightly.   In terms of his mildly elevated troponin, may be secondary to his  cerebrovascular accident.  We will cycle cardiac enzymes.  Further  management based on echo results.   Lastly, PT and OT will be consulted and will likely need follow up as  outpatient.      Barbette Hair. Artist Pais, DO  Electronically Signed    RDY/MEDQ  D:  10/11/2005  T:  10/11/2005  Job:  [id number]   cc:   Gregary Signs A. Everardo All, MD

## 2010-06-26 NOTE — Assessment & Plan Note (Signed)
[facility]                                   ON-CALL NOTE   NAME:Edward Hawkins                  MRN:          [id number]  DATE:10/11/2005                            DOB:          01-Oct-1950    PRIMARY CARE [name]:  Dr. Everardo All.  Home office is [place].   DATE OF INTERACTION:  October 11, 2005, at 1:00 p.m.  Caller was Dr. Belva Bertin at [facility].  Phone number is [phone].   OBJECTIVE:  A 60 year old male.  The patient is diabetic with hypertension,  who has right upper extremity symptoms of possible stroke.  Dr. Modena Jansky is  ordering a CT scan of the head and a call will come to me for the results to  follow up.   ASSESSMENT:  Diabetic hypertensive with possible left-sided stroke.   PLAN:  Await CT call.  If the CT is okay, we will have him follow up  tomorrow.  If it is not, we will have him go to the emergency room for  evaluation and possible admission.                                   Arta Silence, MD   RNS/MedQ  DD:  10/11/2005  DT:  10/11/2005  Job #:  [id number]   cc:   Gregary Signs A. Everardo All, MD

## 2010-06-26 NOTE — Discharge Summary (Signed)
NAME:  Edward Hawkins, Edward Hawkins         ACCOUNT NO.:  [id number]   MEDICAL RECORD NO.:  [id number]          PATIENT TYPE:  INP   LOCATION:  6732                         FACILITY:  [facility]   PHYSICIAN:  Valerie A. Felicity Coyer, MDDATE OF BIRTH:  07-26-1950   DATE OF ADMISSION:  10/11/2005  DATE OF DISCHARGE:                                 DISCHARGE SUMMARY   DIAGNOSES AT TIME OF DISCHARGE:  1. 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  2. Diabetes type 2.  3. Mild elevation of troponin.  4. Dyslipidemia.  5. Debilitation.   HISTORY OF PRESENT ILLNESS:  Edward Hawkins is a 60 year old white male  admitted on October 11, 2005 with chief complaint of right-sided weakness.  He underwent a CT of the head which showed a left frontal infarct.  He was  admitted for further evaluation and treatment.   PAST MEDICAL HISTORY:  1. Diabetes type 2.  2. Hypertension.  3. Dyslipidemia.  4. Asthma.   COURSE OF HOSPITALIZATION.:  Problem 1.  Subacute left cerebral vascular  accident with right-sided weakness.  The patient underwent CT head which  showed a left frontal infarct.  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  Homocystine was within normal limits.  TSH was within  normal limits.  A 2-D echo was performed which showed distal septal  hypokinesis.  The patient did have one troponin which was mildly elevated  early in his admission with value of 0.30.  Subsequent troponins were  negative, and he had no cardiac complaints.  However, due to his multiple  risk factors and the distal septal hypokinesis noted on echocardiogram, he  will be set up for an outpatient stress test.   The patient is instructed to continue to work on factor modification  including low cholesterol diabetic diet.  His A1c was 6.2.   However, it was  up from 5.7 in July 2007, so it will need continued outpatient monitoring.  The patient underwent carotid duplex, and preliminary report shows no  stenosis and bilateral mild soft plaques.   Problem 2.  Debilitation.  He was evaluated by PT and OT who recommended  home health PT, OT.  This will be arranged at time of discharge.  The  patient instructed not to drive until cleared by Dr. Everardo All.   PERTINENT LABORATORY DATA:  At discharge, hemoglobin 14.5, hematocrit 42.6,  BUN 12, creatinine 0.9.  TSH 3.571.   DISPOSITION:  Plan to transfer patient to home.   MEDICATIONS AT DISCHARGE:  1. Actos 15 mg p.o. daily.  2. Lovastatin 80 mg p.o. daily.  3. Advair 500/50 one puff twice daily.  4. Enteric-coated aspirin 325 mg p.o. daily.  5. Diovan 320 mg p.o. daily.  6. Accolade 20 mg p.o. b.i.d.   FOLLOW UP:  The patient is to follow up with Dr. Jonny Ruiz on Monday, September  10 at 2:00 p.m., who was covering for Dr. Everardo All.  In addition, he will be  set up  for a stress test by [facility] prior to discharge.  He is  instructed to go the emergency room should he develop chest pain, worsened  weakness, or slurred speech.     ______________________________  Sandford Craze, PA      Raenette Rover. Felicity Coyer, MD  Electronically Signed    MO/MEDQ  D:  10/13/2005  T:  10/13/2005  Job:  [id number]   cc:   Gregary Signs A. Everardo All, MD

## 2010-09-21 ENCOUNTER — Other Ambulatory Visit (INDEPENDENT_AMBULATORY_CARE_PROVIDER_SITE_OTHER): Payer: PRIVATE HEALTH INSURANCE

## 2010-09-21 ENCOUNTER — Other Ambulatory Visit: Payer: Self-pay | Admitting: Endocrinology

## 2010-09-21 DIAGNOSIS — Z0000 Encounter for general adult medical examination without abnormal findings: Secondary | ICD-10-CM

## 2010-09-21 DIAGNOSIS — Z0389 Encounter for observation for other suspected diseases and conditions ruled out: Secondary | ICD-10-CM

## 2010-09-21 LAB — CBC WITH DIFFERENTIAL/PLATELET
Basophils Absolute: 0 10*3/uL (ref 0.0–0.1)
Basophils Relative: 0.4 % (ref 0.0–3.0)
Eosinophils Absolute: 0.2 10*3/uL (ref 0.0–0.7)
Lymphocytes Relative: 32.5 % (ref 12.0–46.0)
MCHC: 33.7 g/dL (ref 30.0–36.0)
Monocytes Relative: 14.6 % — ABNORMAL HIGH (ref 3.0–12.0)
Neutrophils Relative %: 48.6 % (ref 43.0–77.0)
RBC: 4.17 Mil/uL — ABNORMAL LOW (ref 4.22–5.81)

## 2010-09-21 LAB — URINALYSIS
Bilirubin Urine: NEGATIVE
Hgb urine dipstick: NEGATIVE
Ketones, ur: NEGATIVE
Leukocytes, UA: NEGATIVE
Urobilinogen, UA: 0.2 (ref 0.0–1.0)

## 2010-09-21 LAB — BASIC METABOLIC PANEL
CO2: 26 mEq/L (ref 19–32)
Chloride: 103 mEq/L (ref 96–112)
Glucose, Bld: 127 mg/dL — ABNORMAL HIGH (ref 70–99)
Sodium: 137 mEq/L (ref 135–145)

## 2010-09-21 LAB — HEPATIC FUNCTION PANEL
Albumin: 4.4 g/dL (ref 3.5–5.2)
Alkaline Phosphatase: 75 U/L (ref 39–117)
Total Protein: 7.4 g/dL (ref 6.0–8.3)

## 2010-09-21 LAB — LIPID PANEL
HDL: 40.5 mg/dL (ref >39.00)
LDL Cholesterol: 109 mg/dL — ABNORMAL HIGH (ref 0–99)
Total CHOL/HDL Ratio: 4
Triglycerides: 43 mg/dL (ref 0.0–149.0)
VLDL: 8.6 mg/dL (ref 0.0–40.0)

## 2010-09-21 LAB — TSH: TSH: 1.48 u[IU]/mL (ref 0.35–5.50)

## 2010-09-28 ENCOUNTER — Encounter: Payer: Self-pay | Admitting: Endocrinology

## 2010-09-28 ENCOUNTER — Ambulatory Visit (INDEPENDENT_AMBULATORY_CARE_PROVIDER_SITE_OTHER): Payer: PRIVATE HEALTH INSURANCE | Admitting: Endocrinology

## 2010-09-28 VITALS — BP 138/70 | HR 98 | Temp 99.2°F | Ht 71.0 in | Wt 251.8 lb

## 2010-09-28 DIAGNOSIS — E119 Type 2 diabetes mellitus without complications: Secondary | ICD-10-CM

## 2010-09-28 DIAGNOSIS — M25569 Pain in unspecified knee: Secondary | ICD-10-CM

## 2010-09-28 DIAGNOSIS — Z0000 Encounter for general adult medical examination without abnormal findings: Secondary | ICD-10-CM | POA: Insufficient documentation

## 2010-09-28 DIAGNOSIS — E785 Hyperlipidemia, unspecified: Secondary | ICD-10-CM

## 2010-09-28 DIAGNOSIS — Z23 Encounter for immunization: Secondary | ICD-10-CM

## 2010-09-28 DIAGNOSIS — M25562 Pain in left knee: Secondary | ICD-10-CM

## 2010-09-28 MED ORDER — ATORVASTATIN CALCIUM 20 MG PO TABS: 20.0000 mg | ORAL_TABLET | Freq: Every day | ORAL | Status: DC

## 2010-09-28 NOTE — Patient Instructions (Addendum)
please consider these measures for your health:  minimize alcohol.  do not use tobacco products.  have a colonoscopy at least every 10 years from age 60.  keep firearms safely stored.  always use seat belts.  have working smoke alarms in your home.  see an eye doctor and dentist regularly.  never drive under the influence of alcohol or drugs (including prescription drugs).  those with fair skin should take precautions against the sun. please let me know what your wishes would be, if artificial life support measures should become necessary.  it is critically important to prevent falling down (keep floor areas well-lit, dry, and free of loose objects) Please make a follow-up appointment in 6 months. Change simvastatin to atorvastatin 20 mg daily.  i have sent a prescription to your pharmacy. Go to lab in 6 weeks for lab recheck.  then please call [phone] to hear your test results.  You will be prompted to enter the 9-digit "MRN" number that appears at the top left of this page, followed by #.  Then you will hear the message.   Refer for a colonoscopy.  you will receive a phone call, about a day and time for an appointment.   Refer to orthopedics.  you will receive a phone call, about a day and time for an appointment

## 2010-09-28 NOTE — Progress Notes (Signed)
Subjective:    Patient ID: Edward Hawkins, male    DOB: 1950-09-03, 60 y.o.   MRN: [id number]  HPI here for regular wellness examination.  He's feeling pretty well in general, and says chronic med probs are stable, except as noted below. Past Medical History  Diagnosis Date  . DIABETES MELLITUS, TYPE II 10/08/2006  . HYPERLIPIDEMIA 10/08/2006  . HYPERTENSION 10/08/2006  . ASTHMA 10/08/2006  . CVA 11/11/2009  . RECTAL BLEEDING 09/15/2009  . Diverticulosis   . Colon polyps     tubular adenomas    Past Surgical History  Procedure Date  . Stress cardiolite 11/05/2005  . Transthoracic echocardiogram 10/12/2005    History   Social History  . Marital Status: Single    Spouse Name: N/A    Number of Children: N/A  . Years of Education: N/A   Occupational History  . Works at [employer] History Main Topics  . Smoking status: Not on file  . Smokeless tobacco: Not on file  . Alcohol Use:   . Drug Use:   . Sexually Active:    Other Topics Concern  . Not on file   Social History Narrative  . No narrative on file    Current Outpatient Prescriptions on File Prior to Visit  Medication Sig Dispense Refill  . aspirin 325 MG tablet Take 325 mg by mouth daily.        Marland Kitchen diltiazem (CARDIZEM) 60 MG tablet Take 60 mg by mouth 2 (two) times daily.        . Fluticasone-Salmeterol (ADVAIR DISKUS) 250-50 MCG/DOSE AEPB Inhale 1 puff into the lungs every 12 (twelve) hours.        Marland Kitchen losartan (COZAAR) 100 MG tablet Take 100 mg by mouth daily.        . pioglitazone (ACTOS) 15 MG tablet Take 1 tablet (15 mg total) by mouth daily.  30 tablet  2  . simvastatin (ZOCOR) 10 MG tablet TAKE 1 TABLET BY MOUTH DAILY  30 tablet  3  . zafirlukast (ACCOLATE) 20 MG tablet Take 1 tablet (20 mg total) by mouth 2 (two) times daily.  180 tablet  1    Allergies not on file  Family History  Problem Relation Age of Onset  . Cancer Brother     Lung Cancer  . Cancer Daughter     Breast Cancer    There  were no vitals taken for this visit.      Review of Systems  Constitutional: Negative for fever.  HENT: Negative for hearing loss.   Eyes: Negative for visual disturbance.  Respiratory: Negative for shortness of breath.   Cardiovascular: Negative for chest pain.  Gastrointestinal: Negative for abdominal pain.  Genitourinary: Negative for hematuria.  Musculoskeletal:       Chronic bilat leg pain is unchanged  Skin: Negative for rash.  Neurological: Negative for headaches.  Hematological: Does not bruise/bleed easily.  Psychiatric/Behavioral: Negative for dysphoric mood. The patient is not nervous/anxious.        Objective:   Physical Exam VS: see vs page GEN: no distress HEAD: head: no deformity eyes: no periorbital swelling, no proptosis external nose and ears are normal mouth: no lesion seen NECK: supple, thyroid is not enlarged CHEST WALL: no deformity BREASTS:  No gynecomastia CV: reg rate and rhythm, no murmur ABD: abdomen is soft, nontender.  no hepatosplenomegaly.  not distended.  no hernia RECTAL: normal external and internal exam.  heme neg. PROSTATE:  Normal size.  No nodule MUSCULOSKELETAL: muscle bulk and strength are grossly normal.  no obvious joint swelling.  gait is normal and steady EXTEMITIES: no deformity.  no ulcer on the feet.  feet are of normal color and temp.  no edema PULSES: dorsalis pedis intact bilat.  no carotid bruit NEURO:  cn 2-12 grossly intact.   readily moves all 4's.  sensation is intact to touch on the feet SKIN:  Normal texture and temperature.  No rash or suspicious lesion is visible.   NODES:  None palpable at the neck PSYCH: alert, oriented x3.  Does not appear anxious nor depressed.    Assessment & Plan:  Wellness visit today, with problems stable, except as noted.   SEPARATE EVALUATION FOLLOWS--EACH PROBLEM HERE IS NEW, NOT RESPONDING TO TREATMENT, OR POSES SIGNIFICANT RISK TO THE PATIENT'S HEALTH: HISTORY OF THE PRESENT  ILLNESS: Pt states few years of moderate pain at the posterior aspect of the left knee. It is worse in the context of sleeping.  No assoc numbness. He takes zocor as rx'ed. PAST MEDICAL HISTORY reviewed and up to date today REVIEW OF SYSTEMS: Denies weight change and loc PHYSICAL EXAMINATION: VS:  See vs page GENERAL: no distress Ext: left knee is normal to my exam Msk:  Gait is normal and steady LAB/XRAY RESULTS: Lab Results  Component Value Date   CHOL 158 09/21/2010   CHOL 122 09/08/2009   CHOL 111 09/02/2008   Lab Results  Component Value Date   HDL 40.50 09/21/2010   HDL 16.10* 09/08/2009   HDL 96.04* 09/02/2008   Lab Results  Component Value Date   LDLCALC 109* 09/21/2010   LDLCALC 75 09/08/2009   LDLCALC 70 09/02/2008   Lab Results  Component Value Date   TRIG 43.0 09/21/2010   TRIG 69.0 09/08/2009   TRIG 48.0 09/02/2008   Lab Results  Component Value Date   CHOLHDL 4 09/21/2010   CHOLHDL 4 09/08/2009   CHOLHDL 4 09/02/2008  IMPRESSION: Left knee pain, new Dyslipidemia, needs increased rx Htn, well-controlled.  However, there is an interaction between zocor/lipitor and cardizem PLAN: See instruction page

## 2010-09-30 ENCOUNTER — Other Ambulatory Visit: Payer: Self-pay | Admitting: Endocrinology

## 2010-10-16 ENCOUNTER — Encounter: Payer: Self-pay | Admitting: Internal Medicine

## 2010-11-24 ENCOUNTER — Other Ambulatory Visit: Payer: Self-pay | Admitting: Endocrinology

## 2010-12-03 ENCOUNTER — Encounter: Payer: Self-pay | Admitting: Gastroenterology

## 2011-01-05 ENCOUNTER — Other Ambulatory Visit: Payer: Self-pay | Admitting: Endocrinology

## 2011-05-19 ENCOUNTER — Telehealth: Payer: Self-pay | Admitting: *Deleted

## 2011-05-19 DIAGNOSIS — Z0000 Encounter for general adult medical examination without abnormal findings: Secondary | ICD-10-CM

## 2011-05-19 DIAGNOSIS — Z0389 Encounter for observation for other suspected diseases and conditions ruled out: Secondary | ICD-10-CM

## 2011-05-19 DIAGNOSIS — E119 Type 2 diabetes mellitus without complications: Secondary | ICD-10-CM

## 2011-05-19 NOTE — Telephone Encounter (Signed)
CPX labs placed into Epic for upcoming appointment. 

## 2011-05-19 NOTE — Telephone Encounter (Signed)
Message copied by Carin Primrose on Wed May 19, 2011  2:59 PM ------      Message from: Newell Coral      Created: Mon Apr 26, 2011  2:22 PM      Regarding: cpe sch, needs labs       This pt has scheduled their cpe and is hoping to get labs done too. Thanks!

## 2011-06-14 ENCOUNTER — Other Ambulatory Visit: Payer: Self-pay | Admitting: Endocrinology

## 2011-09-21 ENCOUNTER — Other Ambulatory Visit (INDEPENDENT_AMBULATORY_CARE_PROVIDER_SITE_OTHER): Payer: PRIVATE HEALTH INSURANCE

## 2011-09-21 DIAGNOSIS — E119 Type 2 diabetes mellitus without complications: Secondary | ICD-10-CM

## 2011-09-21 DIAGNOSIS — Z0000 Encounter for general adult medical examination without abnormal findings: Secondary | ICD-10-CM

## 2011-09-21 DIAGNOSIS — Z0389 Encounter for observation for other suspected diseases and conditions ruled out: Secondary | ICD-10-CM

## 2011-09-21 LAB — URINALYSIS, ROUTINE W REFLEX MICROSCOPIC
Hgb urine dipstick: NEGATIVE
Nitrite: NEGATIVE
Specific Gravity, Urine: >=1.03 (ref 1.000–1.030)
Total Protein, Urine: NEGATIVE
Urine Glucose: NEGATIVE
Urobilinogen, UA: 0.2 (ref 0.0–1.0)

## 2011-09-21 LAB — HEPATIC FUNCTION PANEL
ALT: 18 U/L (ref 0–53)
Bilirubin, Direct: 0.1 mg/dL (ref 0.0–0.3)
Total Bilirubin: 0.4 mg/dL (ref 0.3–1.2)

## 2011-09-21 LAB — BASIC METABOLIC PANEL
BUN: 14 mg/dL (ref 6–23)
CO2: 24 mEq/L (ref 19–32)
Chloride: 103 mEq/L (ref 96–112)
GFR: 78.76 mL/min (ref >60.00)
Glucose, Bld: 105 mg/dL — ABNORMAL HIGH (ref 70–99)
Potassium: 4.6 mEq/L (ref 3.5–5.1)
Sodium: 136 mEq/L (ref 135–145)

## 2011-09-21 LAB — CBC WITH DIFFERENTIAL/PLATELET
Basophils Relative: 0.4 % (ref 0.0–3.0)
Eosinophils Relative: 2.9 % (ref 0.0–5.0)
Lymphocytes Relative: 28.1 % (ref 12.0–46.0)
MCV: 99.9 fl (ref 78.0–100.0)
Monocytes Relative: 12.5 % — ABNORMAL HIGH (ref 3.0–12.0)
Neutrophils Relative %: 56.1 % (ref 43.0–77.0)
Platelets: 282 10*3/uL (ref 150.0–400.0)
RBC: 4.15 Mil/uL — ABNORMAL LOW (ref 4.22–5.81)
WBC: 6.2 10*3/uL (ref 4.5–10.5)

## 2011-09-21 LAB — HEMOGLOBIN A1C: Hgb A1c MFr Bld: 6.3 % (ref 4.6–6.5)

## 2011-09-21 LAB — TSH: TSH: 2.86 u[IU]/mL (ref 0.35–5.50)

## 2011-09-21 LAB — LIPID PANEL
HDL: 38.6 mg/dL — ABNORMAL LOW (ref >39.00)
LDL Cholesterol: 83 mg/dL (ref 0–99)
VLDL: 13.8 mg/dL (ref 0.0–40.0)

## 2011-09-29 ENCOUNTER — Encounter: Payer: Self-pay | Admitting: Endocrinology

## 2011-09-29 ENCOUNTER — Ambulatory Visit (INDEPENDENT_AMBULATORY_CARE_PROVIDER_SITE_OTHER): Payer: Self-pay | Admitting: Endocrinology

## 2011-09-29 VITALS — BP 138/80 | HR 90 | Temp 98.2°F | Ht 71.0 in | Wt 247.0 lb

## 2011-09-29 DIAGNOSIS — E119 Type 2 diabetes mellitus without complications: Secondary | ICD-10-CM

## 2011-09-29 DIAGNOSIS — Z0000 Encounter for general adult medical examination without abnormal findings: Secondary | ICD-10-CM

## 2011-09-29 MED ORDER — ZAFIRLUKAST 20 MG PO TABS: ORAL_TABLET | ORAL | Status: DC

## 2011-09-29 MED ORDER — FLUTICASONE-SALMETEROL 250-50 MCG/DOSE IN AEPB: INHALATION_SPRAY | RESPIRATORY_TRACT | Status: DC

## 2011-09-29 MED ORDER — PIOGLITAZONE HCL 15 MG PO TABS: ORAL_TABLET | ORAL | Status: DC

## 2011-09-29 MED ORDER — DILTIAZEM HCL 60 MG PO TABS: ORAL_TABLET | ORAL | Status: DC

## 2011-09-29 MED ORDER — ATORVASTATIN CALCIUM 20 MG PO TABS: 20.0000 mg | ORAL_TABLET | Freq: Every day | ORAL | Status: DC

## 2011-09-29 MED ORDER — LOSARTAN POTASSIUM 100 MG PO TABS: ORAL_TABLET | ORAL | Status: DC

## 2011-09-29 NOTE — Progress Notes (Signed)
Subjective:    Patient ID: Edward Hawkins, male    DOB: 1950/06/25, 61 y.o.   MRN: [id number]  HPI here for regular wellness examination.  He's feeling pretty well in general, and says chronic med probs are stable, except as noted below Past Medical History  Diagnosis Date  . DIABETES MELLITUS, TYPE II 10/08/2006  . HYPERLIPIDEMIA 10/08/2006  . HYPERTENSION 10/08/2006  . ASTHMA 10/08/2006  . CVA 11/11/2009  . RECTAL BLEEDING 09/15/2009  . Diverticulosis   . Colon polyps     tubular adenomas    Past Surgical History  Procedure Date  . Stress cardiolite 11/05/2005  . Transthoracic echocardiogram 10/12/2005    History   Social History  . Marital Status: Single    Spouse Name: N/A    Number of Children: N/A  . Years of Education: N/A   Occupational History  . Works at [employer] History Main Topics  . Smoking status: Never Smoker   . Smokeless tobacco: Not on file  . Alcohol Use: Not on file  . Drug Use: Not on file  . Sexually Active: Not on file   Other Topics Concern  . Not on file   Social History Narrative  . No narrative on file    Current Outpatient Prescriptions on File Prior to Visit  Medication Sig Dispense Refill  . aspirin 325 MG tablet Take 325 mg by mouth daily.        Marland Kitchen DISCONTD: ADVAIR DISKUS 250-50 MCG/DOSE AEPB USE 1 INHALATION TWICE A DAY  60 each  3  . DISCONTD: diltiazem (CARDIZEM) 60 MG tablet TAKE 1 TABLET BY MOUTH TWICE A DAY  180 tablet  3  . DISCONTD: losartan (COZAAR) 100 MG tablet TAKE 1 TABLET BY MOUTH EVERY DAY  90 tablet  3  . DISCONTD: pioglitazone (ACTOS) 15 MG tablet TAKE 1 TABLET BY MOUTH ONCE A DAY  30 tablet  8  . DISCONTD: zafirlukast (ACCOLATE) 20 MG tablet TAKE 1 TABLET BY MOUTH TWICE A DAY  180 tablet  0  . DISCONTD: atorvastatin (LIPITOR) 20 MG tablet Take 1 tablet (20 mg total) by mouth daily.  30 tablet  11  . DISCONTD: Fluticasone-Salmeterol (ADVAIR DISKUS) 250-50 MCG/DOSE AEPB Inhale 1 puff into the lungs every 12  (twelve) hours.          No Known Allergies  Family History  Problem Relation Age of Onset  . Cancer Brother     Lung Cancer  . Cancer Daughter     Breast Cancer    BP 138/80  Pulse 90  Temp 98.2 F (36.8 C) (Oral)  Ht 5\' 11"  (1.803 m)  Wt 247 lb (112.038 kg)  BMI 34.45 kg/m2  SpO2 98%     Review of Systems  Constitutional: Negative for fever and unexpected weight change.  HENT: Negative for hearing loss.   Eyes: Negative for visual disturbance.  Respiratory: Negative for shortness of breath.   Cardiovascular: Negative for chest pain.  Gastrointestinal: Negative for anal bleeding.  Genitourinary: Negative for hematuria and difficulty urinating.  Musculoskeletal: Negative for back pain.  Skin: Negative for rash.  Neurological: Negative for syncope, numbness and headaches.  Hematological: Does not bruise/bleed easily.  Psychiatric/Behavioral: Negative for dysphoric mood.       Objective:   Physical Exam VS: see vs page GEN: no distress HEAD: head: no deformity eyes: no periorbital swelling, no proptosis external nose and ears are normal mouth: no lesion seen NECK: supple, thyroid is not  enlarged CHEST WALL: no deformity LUNGS: clear to auscultation BREASTS:  No gynecomastia CV: reg rate and rhythm, no murmur ABD: abdomen is soft, nontender.  no hepatosplenomegaly.  not distended.  no hernia. RECTAL: normal external and internal exam.  heme neg. PROSTATE:  Normal size.  No nodule MUSCULOSKELETAL: muscle bulk and strength are grossly normal.  no obvious joint swelling.  gait is normal and steady EXTEMITIES: no deformity.  no ulcer on the feet.  feet are of normal color and temp.  no edema PULSES: dorsalis pedis intact bilat.  no carotid bruit NEURO:  cn 2-12 grossly intact.   readily moves all 4's.  sensation is intact to touch on the feet SKIN:  Normal texture and temperature.  No rash or suspicious lesion is visible.   NODES:  None palpable at the  neck PSYCH: alert, oriented x3.  Does not appear anxious nor depressed.     Assessment & Plan:  Wellness visit today, with problems stable, except as noted.

## 2011-09-29 NOTE — Patient Instructions (Addendum)
please consider these measures for your health:  minimize alcohol.  do not use tobacco products.  have a colonoscopy at least every 10 years from age 62.  keep firearms safely stored.  always use seat belts.  have working smoke alarms in your home.  see an eye doctor and dentist regularly.  never drive under the influence of alcohol or drugs (including prescription drugs).  those with fair skin should take precautions against the sun.   Please return in 1 year.   good diet and exercise habits significanly improve the control of your diabetes.  please let me know if you wish to be referred to a dietician.  high blood sugar is very risky to your health.  you should see an eye doctor every year.  You are at higher than average risk for pneumonia and hepatitis-B.  You should be vaccinated against both.   you will receive a phone call, about a day and time for an appointment, for a colonoscopy

## 2011-10-01 ENCOUNTER — Other Ambulatory Visit: Payer: Self-pay | Admitting: Endocrinology

## 2011-10-01 MED ORDER — BUDESONIDE-FORMOTEROL FUMARATE 160-4.5 MCG/ACT IN AERO: 2.0000 | INHALATION_SPRAY | Freq: Two times a day (BID) | RESPIRATORY_TRACT | Status: DC

## 2011-11-02 ENCOUNTER — Ambulatory Visit (AMBULATORY_SURGERY_CENTER): Payer: PRIVATE HEALTH INSURANCE

## 2011-11-02 ENCOUNTER — Encounter: Payer: Self-pay | Admitting: Internal Medicine

## 2011-11-02 VITALS — Ht 71.0 in | Wt 244.9 lb

## 2011-11-02 DIAGNOSIS — Z1211 Encounter for screening for malignant neoplasm of colon: Secondary | ICD-10-CM

## 2011-11-02 DIAGNOSIS — Z86010 Personal history of colonic polyps: Secondary | ICD-10-CM

## 2011-11-02 MED ORDER — MOVIPREP 100 G PO SOLR: ORAL | Status: DC

## 2011-11-16 ENCOUNTER — Encounter: Payer: Self-pay | Admitting: Internal Medicine

## 2011-11-16 ENCOUNTER — Ambulatory Visit (AMBULATORY_SURGERY_CENTER): Payer: PRIVATE HEALTH INSURANCE | Admitting: Internal Medicine

## 2011-11-16 VITALS — BP 143/62 | HR 81 | Temp 98.2°F | Resp 18 | Ht 71.0 in | Wt 244.0 lb

## 2011-11-16 DIAGNOSIS — Z86010 Personal history of colonic polyps: Secondary | ICD-10-CM

## 2011-11-16 DIAGNOSIS — Z1211 Encounter for screening for malignant neoplasm of colon: Secondary | ICD-10-CM

## 2011-11-16 DIAGNOSIS — D126 Benign neoplasm of colon, unspecified: Secondary | ICD-10-CM

## 2011-11-16 LAB — GLUCOSE, CAPILLARY
Glucose-Capillary: 109 mg/dL — ABNORMAL HIGH (ref 70–99)
Glucose-Capillary: 111 mg/dL — ABNORMAL HIGH (ref 70–99)

## 2011-11-16 MED ORDER — SODIUM CHLORIDE 0.9 % IV SOLN: 500.0000 mL | INTRAVENOUS | Status: DC

## 2011-11-16 NOTE — Op Note (Signed)
[facility] [address] [place], [address]   COLONOSCOPY PROCEDURE REPORT  PATIENT: Edward Hawkins, Edward Hawkins  MR#: [id number] BIRTHDATE: [birth year] , 61  yrs. old GENDER: Male ENDOSCOPIST: Roxy Cedar, MD REFERRED GE:[id number] Program Recall PROCEDURE DATE:  11/16/2011 PROCEDURE:   Colonoscopy with snare polypectomy    x 4 ASA CLASS:   Class III INDICATIONS:High risk screening; patient's personal history of adenomatous colon polyps (index 09-2007 w/ 3 adenomas). MEDICATIONS: MAC sedation, administered by CRNA and propofol (Diprivan) 450mg  IV  DESCRIPTION OF PROCEDURE:   After the risks benefits and alternatives of the procedure were thoroughly explained, informed consent was obtained.  A digital rectal exam revealed no abnormalities of the rectum.   The LB CF-[id number]  endoscope was introduced through the anus and advanced to the cecum, which was identified by both the appendix and ileocecal valve. No adverse events experienced.   The quality of the prep was good, using MoviPrep  The instrument was then slowly withdrawn as the colon was fully examined.      COLON FINDINGS: Two sessile polyps measuring 5 and11 mm in size were found at the cecum.  A polypectomy was performed with a cold snare. The resection was complete and the polyp tissue was completely retrieved.   Two polyps measuring 5 mm in size were found in the transverse colon.  A polypectomy was performed with a cold snare. The resection was complete and the polyp tissue was completely retrieved.   Moderate diverticulosis was noted in the sigmoid colon.  Retroflexed views revealed internal hemorrhoids. The time to cecum=1 minutes 40 seconds.  Withdrawal time=16 minutes 17 seconds.  The scope was withdrawn and the procedure completed. COMPLICATIONS: There were no complications.  ENDOSCOPIC IMPRESSION: 1.   Two sessile polyps measuring 5,11 mm in size were found at the cecum; polypectomy  was performed with a cold snare 2.   Two polyps measuring 5 mm in size were found in the transverse colon; polypectomy was performed with a cold snare 3.   Moderate diverticulosis was noted in the sigmoid colon  RECOMMENDATIONS: 1.Repeat Colonoscopy in 3 years.   eSigned:  Roxy Cedar, MD 11/16/2011 8:39 AM  cc: Minus Breeding, MD and The Patient   PATIENT NAME:  Edward Hawkins, Edward Hawkins MR#: [id number]

## 2011-11-16 NOTE — Progress Notes (Addendum)
Patient did not have preoperative order for IV antibiotic SSI prophylaxis. (G8918)  Patient did not experience any of the following events: a burn prior to discharge; a fall within the facility; wrong site/side/patient/procedure/implant event; or a hospital transfer or hospital admission upon discharge from the facility. (G8907)  

## 2011-11-16 NOTE — Patient Instructions (Addendum)
YOU HAD AN ENDOSCOPIC PROCEDURE TODAY AT THE [facility]: Refer to the procedure report that was given to you for any specific questions about what was found during the examination.  If the procedure report does not answer your questions, please call your gastroenterologist to clarify.  If you requested that your care partner not be given the details of your procedure findings, then the procedure report has been included in a sealed envelope for you to review at your convenience later.  YOU SHOULD EXPECT: Some feelings of bloating in the abdomen. Passage of more gas than usual.  Walking can help get rid of the air that was put into your GI tract during the procedure and reduce the bloating. If you had a lower endoscopy (such as a colonoscopy or flexible sigmoidoscopy) you may notice spotting of blood in your stool or on the toilet paper. If you underwent a bowel prep for your procedure, then you may not have a normal bowel movement for a few days.  DIET: Your first meal following the procedure should be a light meal and then it is ok to progress to your normal diet.  A half-sandwich or bowl of soup is an example of a good first meal.  Heavy or fried foods are harder to digest and may make you feel nauseous or bloated.  Likewise meals heavy in dairy and vegetables can cause extra gas to form and this can also increase the bloating.  Drink plenty of fluids but you should avoid alcoholic beverages for 24 hours.  ACTIVITY: Your care partner should take you home directly after the procedure.  You should plan to take it easy, moving slowly for the rest of the day.  You can resume normal activity the day after the procedure however you should NOT DRIVE or use heavy machinery for 24 hours (because of the sedation medicines used during the test).    SYMPTOMS TO REPORT IMMEDIATELY: A gastroenterologist can be reached at any hour.  During normal business hours, 8:30 AM to 5:00 PM Monday through Friday,  call [phone].  After hours and on weekends, please call the GI answering service at [phone] who will take a message and have the physician on call contact you.   Following lower endoscopy (colonoscopy or flexible sigmoidoscopy):  Excessive amounts of blood in the stool  Significant tenderness or worsening of abdominal pains  Swelling of the abdomen that is new, acute  Fever of 100F or higher  FOLLOW UP: If any biopsies were taken you will be contacted by phone or by letter within the next 1-3 weeks.  Call your gastroenterologist if you have not heard about the biopsies in 3 weeks.  Our staff will call the home number listed on your records the next business day following your procedure to check on you and address any questions or concerns that you may have at that time regarding the information given to you following your procedure. This is a courtesy call and so if there is no answer at the home number and we have not heard from you through the emergency physician on call, we will assume that you have returned to your regular daily activities without incident.  SIGNATURES/CONFIDENTIALITY: You and/or your care partner have signed paperwork which will be entered into your electronic medical record.  These signatures attest to the fact that that the information above on your After Visit Summary has been reviewed and is understood.  Full responsibility of the confidentiality of this   discharge information lies with you and/or your care-partner.   Thank-you for choosing us for your healthcare needs. 

## 2011-11-17 ENCOUNTER — Telehealth: Payer: Self-pay

## 2011-11-17 NOTE — Telephone Encounter (Signed)
Left message on answering machine. 

## 2011-11-22 ENCOUNTER — Encounter: Payer: Self-pay | Admitting: Internal Medicine

## 2011-12-16 ENCOUNTER — Other Ambulatory Visit: Payer: Self-pay | Admitting: Endocrinology

## 2011-12-16 ENCOUNTER — Other Ambulatory Visit: Payer: Self-pay | Admitting: *Deleted

## 2011-12-16 MED ORDER — FLUTICASONE-SALMETEROL 250-50 MCG/DOSE IN AEPB: 1.0000 | INHALATION_SPRAY | Freq: Every day | RESPIRATORY_TRACT | Status: DC

## 2011-12-16 NOTE — Telephone Encounter (Signed)
Medication refill request.

## 2011-12-22 ENCOUNTER — Other Ambulatory Visit: Payer: Self-pay | Admitting: Endocrinology

## 2011-12-22 DIAGNOSIS — J45909 Unspecified asthma, uncomplicated: Secondary | ICD-10-CM

## 2011-12-24 ENCOUNTER — Institutional Professional Consult (permissible substitution): Payer: PRIVATE HEALTH INSURANCE | Admitting: Pulmonary Disease

## 2012-02-25 ENCOUNTER — Telehealth: Payer: Self-pay | Admitting: Endocrinology

## 2012-02-25 MED ORDER — FLUTICASONE-SALMETEROL 250-50 MCG/DOSE IN AEPB: 1.0000 | INHALATION_SPRAY | Freq: Every day | RESPIRATORY_TRACT | Status: DC

## 2012-02-25 NOTE — Telephone Encounter (Signed)
The patient called to report that he has new insurance and needs a new rx for Advair Diskus 250/50 mcg/dose sent to the [facility] on [address].  The patient may be reached at [phone] if needed.

## 2012-04-10 ENCOUNTER — Telehealth: Payer: Self-pay | Admitting: Endocrinology

## 2012-04-10 MED ORDER — ATORVASTATIN CALCIUM 20 MG PO TABS: 20.0000 mg | ORAL_TABLET | Freq: Every day | ORAL | Status: DC

## 2012-04-10 MED ORDER — LOSARTAN POTASSIUM 100 MG PO TABS: ORAL_TABLET | ORAL | Status: DC

## 2012-04-10 MED ORDER — PIOGLITAZONE HCL 15 MG PO TABS: ORAL_TABLET | ORAL | Status: DC

## 2012-04-25 ENCOUNTER — Encounter: Payer: Self-pay | Admitting: Endocrinology

## 2012-05-01 ENCOUNTER — Encounter: Payer: Self-pay | Admitting: Endocrinology

## 2012-05-01 ENCOUNTER — Ambulatory Visit (INDEPENDENT_AMBULATORY_CARE_PROVIDER_SITE_OTHER): Payer: BC Managed Care – PPO | Admitting: Endocrinology

## 2012-05-01 ENCOUNTER — Telehealth: Payer: Self-pay | Admitting: Endocrinology

## 2012-05-01 VITALS — BP 134/78 | HR 79 | Temp 98.7°F | Wt 236.0 lb

## 2012-05-01 DIAGNOSIS — J069 Acute upper respiratory infection, unspecified: Secondary | ICD-10-CM

## 2012-05-01 MED ORDER — PROMETHAZINE-CODEINE 6.25-10 MG/5ML PO SYRP: 5.0000 mL | ORAL_SOLUTION | ORAL | Status: DC | PRN

## 2012-05-01 NOTE — Progress Notes (Signed)
  Subjective:    Patient ID: Edward Hawkins, male    DOB: February 11, 1950, 62 y.o.   MRN: [id number]  HPI Pt states 1 week of slightly prod-prod quality cough in the chest.  He had assoc fever, but that is resolved.   Past Medical History  Diagnosis Date  . DIABETES MELLITUS, TYPE II 10/08/2006  . HYPERLIPIDEMIA 10/08/2006  . HYPERTENSION 10/08/2006  . ASTHMA 10/08/2006  . CVA 11/11/2009  . RECTAL BLEEDING 09/15/2009  . Diverticulosis   . Colon polyps     tubular adenomas    Past Surgical History  Procedure Laterality Date  . Stress cardiolite  11/05/2005  . Transthoracic echocardiogram  10/12/2005  . Knee surgery      left knee    History   Social History  . Marital Status: Single    Spouse Name: N/A    Number of Children: N/A  . Years of Education: N/A   Occupational History  . Works at [employer] History Main Topics  . Smoking status: Never Smoker   . Smokeless tobacco: Never Used  . Alcohol Use: No  . Drug Use: No  . Sexually Active: Not on file   Other Topics Concern  . Not on file   Social History Narrative  . No narrative on file    Current Outpatient Prescriptions on File Prior to Visit  Medication Sig Dispense Refill  . aspirin 325 MG tablet Take 325 mg by mouth daily.        . budesonide-formoterol (SYMBICORT) 160-4.5 MCG/ACT inhaler Inhale 2 puffs into the lungs 2 (two) times daily.  1 Inhaler  12  . diltiazem (CARDIZEM) 60 MG tablet TAKE 1 TABLET BY MOUTH TWICE A DAY  180 tablet  3  . fish oil-omega-3 fatty acids 1000 MG capsule Take 1 g by mouth daily.      . Fluticasone-Salmeterol (ADVAIR DISKUS) 250-50 MCG/DOSE AEPB Inhale 1 puff into the lungs daily.  60 each  3  . MOVIPREP 100 G SOLR       . Multiple Vitamin (MULTIVITAMIN) tablet [facility] MVI 50 plus-Take one daily      . traMADol (ULTRAM) 50 MG tablet 1 to 2 tablets by mouth every 6 hours as needed      . zafirlukast (ACCOLATE) 20 MG tablet TAKE 1 TABLET BY MOUTH TWICE A DAY  180 tablet  3    No current facility-administered medications on file prior to visit.    No Known Allergies  Family History  Problem Relation Age of Onset  . Cancer Brother     Lung Cancer  . Cancer Daughter     Breast Cancer  . Diabetes Mother   . Diabetes Father     BP 134/78  Pulse 79  Temp(Src) 98.7 F (37.1 C) (Oral)  Wt 236 lb (107.049 kg)  BMI 32.93 kg/m2  SpO2 97%  Review of Systems He has little if any wheezing.  No nasal congestion.      Objective:   Physical Exam VITAL SIGNS:  See vs page GENERAL: no distress head: no deformity eyes: no periorbital swelling, no proptosis external nose and ears are normal mouth: no lesion seen Both eac's and tm's are normal LUNGS:  Clear to auscultation.     Assessment & Plan:  URI, new

## 2012-05-01 NOTE — Telephone Encounter (Signed)
Please advise ov 

## 2012-05-01 NOTE — Telephone Encounter (Signed)
The patient called stating he has a nagging cough and is hoping to get a cough medicine called into his pharmacy.

## 2012-05-01 NOTE — Patient Instructions (Signed)
Here is a prescription for cough syrup. I hope you feel better soon.  If you don't feel better in 2 weeks, please call back.

## 2012-05-02 ENCOUNTER — Other Ambulatory Visit: Payer: Self-pay

## 2012-05-02 MED ORDER — ATORVASTATIN CALCIUM 20 MG PO TABS: 20.0000 mg | ORAL_TABLET | Freq: Every day | ORAL | Status: DC

## 2012-05-02 MED ORDER — LOSARTAN POTASSIUM 100 MG PO TABS: ORAL_TABLET | ORAL | Status: DC

## 2012-05-02 MED ORDER — PIOGLITAZONE HCL 15 MG PO TABS: ORAL_TABLET | ORAL | Status: DC

## 2012-05-02 NOTE — Telephone Encounter (Signed)
Edward Hawkins, can this phone note be closed? EPIC doesn't give me the parameters to close it.

## 2012-05-09 ENCOUNTER — Other Ambulatory Visit: Payer: Self-pay

## 2012-05-09 MED ORDER — DILTIAZEM HCL 60 MG PO TABS: ORAL_TABLET | ORAL | Status: DC

## 2012-09-29 ENCOUNTER — Ambulatory Visit (INDEPENDENT_AMBULATORY_CARE_PROVIDER_SITE_OTHER): Payer: BC Managed Care – PPO | Admitting: Endocrinology

## 2012-09-29 ENCOUNTER — Encounter: Payer: Self-pay | Admitting: Endocrinology

## 2012-09-29 VITALS — BP 136/80 | HR 78 | Ht 71.0 in | Wt 239.0 lb

## 2012-09-29 DIAGNOSIS — E119 Type 2 diabetes mellitus without complications: Secondary | ICD-10-CM

## 2012-09-29 DIAGNOSIS — Z0000 Encounter for general adult medical examination without abnormal findings: Secondary | ICD-10-CM

## 2012-09-29 DIAGNOSIS — Z125 Encounter for screening for malignant neoplasm of prostate: Secondary | ICD-10-CM

## 2012-09-29 DIAGNOSIS — E785 Hyperlipidemia, unspecified: Secondary | ICD-10-CM

## 2012-09-29 DIAGNOSIS — I10 Essential (primary) hypertension: Secondary | ICD-10-CM

## 2012-09-29 LAB — URINALYSIS, ROUTINE W REFLEX MICROSCOPIC
Ketones, ur: NEGATIVE
Leukocytes, UA: NEGATIVE
Nitrite: NEGATIVE
RBC / HPF: NONE SEEN (ref 0–?)
Specific Gravity, Urine: 1.025 (ref 1.000–1.030)
Urobilinogen, UA: 0.2 (ref 0.0–1.0)
WBC, UA: NONE SEEN (ref 0–?)
pH: 6 (ref 5.0–8.0)

## 2012-09-29 LAB — CBC WITH DIFFERENTIAL/PLATELET
Basophils Absolute: 0.1 10*3/uL (ref 0.0–0.1)
Eosinophils Absolute: 0.4 10*3/uL (ref 0.0–0.7)
Hemoglobin: 14.2 g/dL (ref 13.0–17.0)
Lymphocytes Relative: 26.9 % (ref 12.0–46.0)
MCHC: 33.9 g/dL (ref 30.0–36.0)
Monocytes Relative: 12.7 % — ABNORMAL HIGH (ref 3.0–12.0)
Neutro Abs: 4 10*3/uL (ref 1.4–7.7)
Neutrophils Relative %: 54.3 % (ref 43.0–77.0)
RBC: 4.29 Mil/uL (ref 4.22–5.81)
RDW: 13.5 % (ref 11.5–14.6)

## 2012-09-29 LAB — BASIC METABOLIC PANEL
Calcium: 9.5 mg/dL (ref 8.4–10.5)
GFR: 86.25 mL/min (ref >60.00)
Glucose, Bld: 111 mg/dL — ABNORMAL HIGH (ref 70–99)
Potassium: 4.3 mEq/L (ref 3.5–5.1)
Sodium: 139 mEq/L (ref 135–145)

## 2012-09-29 LAB — HEPATIC FUNCTION PANEL
ALT: 31 U/L (ref 0–53)
Albumin: 4.3 g/dL (ref 3.5–5.2)
Alkaline Phosphatase: 87 U/L (ref 39–117)
Bilirubin, Direct: 0 mg/dL (ref 0.0–0.3)
Total Protein: 7.7 g/dL (ref 6.0–8.3)

## 2012-09-29 LAB — MICROALBUMIN / CREATININE URINE RATIO
Creatinine,U: 168.3 mg/dL
Microalb Creat Ratio: 0.3 mg/g (ref 0.0–30.0)
Microalb, Ur: 0.5 mg/dL (ref 0.0–1.9)

## 2012-09-29 LAB — TSH: TSH: 1.95 u[IU]/mL (ref 0.35–5.50)

## 2012-09-29 LAB — HEMOGLOBIN A1C: Hgb A1c MFr Bld: 6.2 % (ref 4.6–6.5)

## 2012-09-29 LAB — LIPID PANEL: Total CHOL/HDL Ratio: 4

## 2012-09-29 MED ORDER — FLUTICASONE-SALMETEROL 250-50 MCG/DOSE IN AEPB: 1.0000 | INHALATION_SPRAY | Freq: Every day | RESPIRATORY_TRACT | Status: DC

## 2012-09-29 NOTE — Patient Instructions (Addendum)
blood tests are being requested for you today.  We'll contact you with results. please consider these measures for your health:  minimize alcohol.  do not use tobacco products.  have a colonoscopy at least every 10 years from age 62.  keep firearms safely stored.  always use seat belts.  have working smoke alarms in your home.  see an eye doctor and dentist regularly.  never drive under the influence of alcohol or drugs (including prescription drugs).  those with fair skin should take precautions against the sun. Please come back for a follow-up appointment in 6 months.    

## 2012-09-29 NOTE — Progress Notes (Signed)
Subjective:    Patient ID: Edward Hawkins, male    DOB: [birth year], 62 y.o.   MRN: [id number]  HPI Pt is here for regular wellness examination, and is feeling pretty well in general, and says chronic med probs are stable, except as noted below Past Medical History  Diagnosis Date  . DIABETES MELLITUS, TYPE II 10/08/2006  . HYPERLIPIDEMIA 10/08/2006  . HYPERTENSION 10/08/2006  . ASTHMA 10/08/2006  . CVA 11/11/2009  . RECTAL BLEEDING 09/15/2009  . Diverticulosis   . Colon polyps     tubular adenomas    Past Surgical History  Procedure Laterality Date  . Stress cardiolite  11/05/2005  . Transthoracic echocardiogram  10/12/2005  . Knee surgery      left knee    History   Social History  . Marital Status: Single    Spouse Name: N/A    Number of Children: N/A  . Years of Education: N/A   Occupational History  . Works at [employer] History Main Topics  . Smoking status: Never Smoker   . Smokeless tobacco: Never Used  . Alcohol Use: No  . Drug Use: No  . Sexual Activity: Not on file   Other Topics Concern  . Not on file   Social History Narrative  . No narrative on file    Current Outpatient Prescriptions on File Prior to Visit  Medication Sig Dispense Refill  . aspirin 325 MG tablet Take 325 mg by mouth daily.        Marland Kitchen atorvastatin (LIPITOR) 20 MG tablet Take 1 tablet (20 mg total) by mouth daily.  90 tablet  3  . budesonide-formoterol (SYMBICORT) 160-4.5 MCG/ACT inhaler Inhale 2 puffs into the lungs 2 (two) times daily.  1 Inhaler  12  . diltiazem (CARDIZEM) 60 MG tablet TAKE 1 TABLET BY MOUTH TWICE A DAY  180 tablet  3  . fish oil-omega-3 fatty acids 1000 MG capsule Take 1 g by mouth daily.      Marland Kitchen losartan (COZAAR) 100 MG tablet TAKE 1 TABLET BY MOUTH EVERY DAY  90 tablet  3  . MOVIPREP 100 G SOLR       . Multiple Vitamin (MULTIVITAMIN) tablet [facility] MVI 50 plus-Take one daily      . pioglitazone (ACTOS) 15 MG tablet TAKE 1 TABLET BY MOUTH ONCE A DAY  90  tablet  3  . traMADol (ULTRAM) 50 MG tablet 1 to 2 tablets by mouth every 6 hours as needed      . zafirlukast (ACCOLATE) 20 MG tablet TAKE 1 TABLET BY MOUTH TWICE A DAY  180 tablet  3   No current facility-administered medications on file prior to visit.    No Known Allergies  Family History  Problem Relation Age of Onset  . Cancer Brother     Lung Cancer  . Cancer Daughter     Breast Cancer  . Diabetes Mother   . Diabetes Father     BP 136/80  Pulse 78  Ht 5\' 11"  (1.803 m)  Wt 239 lb (108.41 kg)  BMI 33.35 kg/m2  SpO2 98%     Review of Systems  Constitutional: Negative for fever and unexpected weight change.  HENT: Negative for hearing loss.   Eyes: Negative for visual disturbance.  Respiratory: Negative for shortness of breath.   Cardiovascular: Negative for chest pain.  Gastrointestinal: Negative for anal bleeding.  Endocrine: Negative for cold intolerance.  Genitourinary: Negative for hematuria and difficulty urinating.  Musculoskeletal:  Negative for back pain.  Skin: Negative for rash.  Allergic/Immunologic: Negative for environmental allergies.  Neurological: Negative for syncope and numbness.  Hematological: Does not bruise/bleed easily.  Psychiatric/Behavioral: Negative for dysphoric mood.       Objective:   Physical Exam VS: see vs page GEN: no distress HEAD: head: no deformity eyes: no periorbital swelling, no proptosis external nose and ears are normal mouth: no lesion seen NECK: supple, thyroid is not enlarged CHEST WALL: no deformity LUNGS: clear to auscultation BREASTS:  No gynecomastia CV: reg rate and rhythm, no murmur ABD: abdomen is soft, nontender.  no hepatosplenomegaly.  not distended.  no hernia RECTAL: normal external and internal exam.  heme neg. PROSTATE:  Normal size.  No nodule MUSCULOSKELETAL: muscle bulk and strength are grossly normal.  no obvious joint swelling.  gait is normal and steady EXTEMITIES: no deformity.  no  ulcer on the feet.  feet are of normal color and temp.  no edema PULSES: dorsalis pedis intact bilat.  no carotid bruit NEURO:  cn 2-12 grossly intact.   readily moves all 4's.  sensation is intact to touch on the feet SKIN:  Normal texture and temperature.  No rash or suspicious lesion is visible.   NODES:  None palpable at the neck PSYCH: alert, oriented x3.  Does not appear anxious nor depressed.       Assessment & Plan:  Wellness visit today, with problems stable, except as noted. we discussed code status.  pt requests full code, but would not want to be started or maintained on artificial life-support measures if there was not a reasonable chance of recovery

## 2012-11-20 ENCOUNTER — Other Ambulatory Visit: Payer: Self-pay | Admitting: Endocrinology

## 2013-04-26 ENCOUNTER — Other Ambulatory Visit: Payer: Self-pay | Admitting: Endocrinology

## 2013-06-18 ENCOUNTER — Other Ambulatory Visit: Payer: Self-pay | Admitting: Endocrinology

## 2013-07-23 LAB — HM DIABETES EYE EXAM

## 2013-07-27 ENCOUNTER — Other Ambulatory Visit: Payer: Self-pay | Admitting: Endocrinology

## 2013-07-30 ENCOUNTER — Encounter: Payer: Self-pay | Admitting: Endocrinology

## 2013-07-31 ENCOUNTER — Telehealth: Payer: Self-pay | Admitting: Endocrinology

## 2013-07-31 MED ORDER — DILTIAZEM HCL 60 MG PO TABS: ORAL_TABLET | ORAL | Status: DC

## 2013-07-31 NOTE — Telephone Encounter (Signed)
Rx sent to pharmacy   

## 2013-07-31 NOTE — Telephone Encounter (Signed)
Pt needs the generic for cardizem called [facility] please

## 2013-08-30 ENCOUNTER — Other Ambulatory Visit: Payer: Self-pay | Admitting: Endocrinology

## 2013-10-01 ENCOUNTER — Ambulatory Visit (INDEPENDENT_AMBULATORY_CARE_PROVIDER_SITE_OTHER): Payer: BC Managed Care – PPO | Admitting: Endocrinology

## 2013-10-01 ENCOUNTER — Encounter: Payer: Self-pay | Admitting: Endocrinology

## 2013-10-01 VITALS — BP 132/74 | HR 85 | Temp 98.4°F | Ht 71.0 in | Wt 248.0 lb

## 2013-10-01 DIAGNOSIS — I10 Essential (primary) hypertension: Secondary | ICD-10-CM

## 2013-10-01 DIAGNOSIS — Z0000 Encounter for general adult medical examination without abnormal findings: Secondary | ICD-10-CM

## 2013-10-01 DIAGNOSIS — Z23 Encounter for immunization: Secondary | ICD-10-CM

## 2013-10-01 DIAGNOSIS — E119 Type 2 diabetes mellitus without complications: Secondary | ICD-10-CM

## 2013-10-01 DIAGNOSIS — E785 Hyperlipidemia, unspecified: Secondary | ICD-10-CM

## 2013-10-01 DIAGNOSIS — Z125 Encounter for screening for malignant neoplasm of prostate: Secondary | ICD-10-CM

## 2013-10-01 LAB — URINALYSIS, ROUTINE W REFLEX MICROSCOPIC
Bilirubin Urine: NEGATIVE
Hgb urine dipstick: NEGATIVE
Ketones, ur: NEGATIVE
LEUKOCYTES UA: NEGATIVE
NITRITE: NEGATIVE
PH: 6 (ref 5.0–8.0)
RBC / HPF: NONE SEEN (ref 0–?)
Specific Gravity, Urine: 1.015 (ref 1.000–1.030)
Total Protein, Urine: NEGATIVE
Urine Glucose: NEGATIVE
Urobilinogen, UA: 0.2 (ref 0.0–1.0)

## 2013-10-01 LAB — HEMOGLOBIN A1C: Hgb A1c MFr Bld: 6.4 % (ref 4.6–6.5)

## 2013-10-01 LAB — MICROALBUMIN / CREATININE URINE RATIO
Creatinine,U: 163.4 mg/dL
MICROALB UR: 1.3 mg/dL (ref 0.0–1.9)
MICROALB/CREAT RATIO: 0.8 mg/g (ref 0.0–30.0)

## 2013-10-01 LAB — LIPID PANEL
Cholesterol: 121 mg/dL (ref 0–200)
HDL: 37.1 mg/dL — ABNORMAL LOW
LDL Cholesterol: 74 mg/dL (ref 0–99)
NonHDL: 83.9
Total CHOL/HDL Ratio: 3
Triglycerides: 52 mg/dL (ref 0.0–149.0)
VLDL: 10.4 mg/dL (ref 0.0–40.0)

## 2013-10-01 LAB — CBC WITH DIFFERENTIAL/PLATELET
Basophils Absolute: 0 10*3/uL (ref 0.0–0.1)
Basophils Relative: 0.5 % (ref 0.0–3.0)
Eosinophils Absolute: 0.3 10*3/uL (ref 0.0–0.7)
Eosinophils Relative: 5.6 % — ABNORMAL HIGH (ref 0.0–5.0)
HCT: 39.2 % (ref 39.0–52.0)
Hemoglobin: 13.1 g/dL (ref 13.0–17.0)
Lymphocytes Relative: 22 % (ref 12.0–46.0)
Lymphs Abs: 1.3 10*3/uL (ref 0.7–4.0)
MCHC: 33.3 g/dL (ref 30.0–36.0)
MCV: 98.1 fl (ref 78.0–100.0)
MONOS PCT: 11.8 % (ref 3.0–12.0)
Monocytes Absolute: 0.7 10*3/uL (ref 0.1–1.0)
NEUTROS PCT: 60.1 % (ref 43.0–77.0)
Neutro Abs: 3.5 10*3/uL (ref 1.4–7.7)
PLATELETS: 261 10*3/uL (ref 150.0–400.0)
RBC: 3.99 Mil/uL — ABNORMAL LOW (ref 4.22–5.81)
RDW: 13.2 % (ref 11.5–15.5)
WBC: 5.8 10*3/uL (ref 4.0–10.5)

## 2013-10-01 LAB — TSH: TSH: 1.48 u[IU]/mL (ref 0.35–4.50)

## 2013-10-01 LAB — BASIC METABOLIC PANEL
BUN: 17 mg/dL (ref 6–23)
CALCIUM: 9 mg/dL (ref 8.4–10.5)
CO2: 24 mEq/L (ref 19–32)
Chloride: 108 mEq/L (ref 96–112)
Creatinine, Ser: 1 mg/dL (ref 0.4–1.5)
GFR: 82.91 mL/min (ref >60.00)
GLUCOSE: 112 mg/dL — AB (ref 70–99)
Potassium: 4 mEq/L (ref 3.5–5.1)
Sodium: 139 mEq/L (ref 135–145)

## 2013-10-01 LAB — HEPATIC FUNCTION PANEL
ALBUMIN: 4 g/dL (ref 3.5–5.2)
ALT: 25 U/L (ref 0–53)
AST: 27 U/L (ref 0–37)
Alkaline Phosphatase: 80 U/L (ref 39–117)
Bilirubin, Direct: 0.1 mg/dL (ref 0.0–0.3)
TOTAL PROTEIN: 7.1 g/dL (ref 6.0–8.3)
Total Bilirubin: 0.6 mg/dL (ref 0.2–1.2)

## 2013-10-01 LAB — PSA: PSA: 0.69 ng/mL (ref 0.10–4.00)

## 2013-10-01 NOTE — Patient Instructions (Signed)
please consider these measures for your health:  minimize alcohol.  do not use tobacco products.  have a colonoscopy at least every 10 years from age 63.  keep firearms safely stored.  always use seat belts.  have working smoke alarms in your home.  see an eye doctor and dentist regularly.  never drive under the influence of alcohol or drugs (including prescription drugs).  those with fair skin should take precautions against the sun. blood tests are being requested for you today.  We'll contact you with results. good diet and exercise habits significanly improve the control of your diabetes.  please let me know if you wish to be referred to a dietician.  high blood sugar is very risky to your health.  you should see an eye doctor and dentist every year.  You are at higher than average risk for pneumonia and hepatitis-B.  You should be vaccinated against both.   controlling your blood pressure and cholesterol drastically reduces the damage diabetes does to your body.  this also applies to quitting smoking.  please discuss these with your doctor.  Please come back for a follow-up appointment in 6 months.

## 2013-10-01 NOTE — Progress Notes (Signed)
Subjective:    Patient ID: Edward Hawkins, male    DOB: 12-22-1950, 63 y.o.   MRN: [id number]  HPI Pt is here for regular wellness examination, and is feeling pretty well in general, and says chronic med probs are stable, except as noted below Past Medical History  Diagnosis Date  . DIABETES MELLITUS, TYPE II 10/08/2006  . HYPERLIPIDEMIA 10/08/2006  . HYPERTENSION 10/08/2006  . ASTHMA 10/08/2006  . CVA 11/11/2009  . RECTAL BLEEDING 09/15/2009  . Diverticulosis   . Colon polyps     tubular adenomas    Past Surgical History  Procedure Laterality Date  . Stress cardiolite  11/05/2005  . Transthoracic echocardiogram  10/12/2005  . Knee surgery      left knee    History   Social History  . Marital Status: Single    Spouse Name: N/A    Number of Children: N/A  . Years of Education: N/A   Occupational History  . Works at [employer]  . Smoking status: Never Smoker   . Smokeless tobacco: Never Used  . Alcohol Use: No  . Drug Use: No  . Sexual Activity: Not on file   Other Topics Concern  . Not on file   Social History Narrative  . No narrative on file    Current Outpatient Prescriptions on File Prior to Visit  Medication Sig Dispense Refill  . ADVAIR DISKUS 250-50 MCG/DOSE AEPB INHALE 1 PUFF BY MOUTH INTO LUNGS DAILY  60 each  0  . aspirin 325 MG tablet Take 325 mg by mouth daily.        Marland Kitchen atorvastatin (LIPITOR) 20 MG tablet TAKE 1 TABLET (20 MG TOTAL) BY MOUTH DAILY.  90 tablet  3  . diltiazem (CARDIZEM) 60 MG tablet TAKE 1 TABLET BY MOUTH TWICE A DAY  60 tablet  0  . fish oil-omega-3 fatty acids 1000 MG capsule Take 1 g by mouth daily.      Marland Kitchen losartan (COZAAR) 100 MG tablet TAKE 1 TABLET BY MOUTH ONCE A DAY  90 tablet  3  . MOVIPREP 100 G SOLR       . Multiple Vitamin (MULTIVITAMIN) tablet [facility] MVI 50 plus-Take one daily      . pioglitazone (ACTOS) 15 MG tablet TAKE 1 TABLET BY MOUTH ONCE A DAY  90 tablet  3  . traMADol (ULTRAM) 50 MG  tablet 1 to 2 tablets by mouth every 6 hours as needed      . zafirlukast (ACCOLATE) 20 MG tablet TAKE 1 TABLET BY MOUTH TWICE A DAY  180 tablet  1   No current facility-administered medications on file prior to visit.    No Known Allergies  Family History  Problem Relation Age of Onset  . Cancer Brother     Lung Cancer  . Cancer Daughter     Breast Cancer  . Diabetes Mother   . Diabetes Father     BP 132/74  Pulse 85  Temp(Src) 98.4 F (36.9 C) (Oral)  Ht 5\' 11"  (1.803 m)  Wt 248 lb (112.492 kg)  BMI 34.60 kg/m2  SpO2 96%   Review of Systems  Constitutional: Negative for fever.       He has gained a few lbs  HENT: Negative for hearing loss.   Eyes: Negative for visual disturbance.  Respiratory: Negative for shortness of breath.   Cardiovascular: Negative for chest pain.  Gastrointestinal: Negative for anal bleeding.  Endocrine: Negative  for cold intolerance.  Genitourinary: Negative for hematuria.  Musculoskeletal: Negative for back pain.  Skin: Negative for rash.  Allergic/Immunologic: Negative for environmental allergies.  Neurological: Negative for syncope.  Hematological: Does not bruise/bleed easily.  Psychiatric/Behavioral: Negative for dysphoric mood.       Objective:   Physical Exam VS: see vs page GEN: no distress HEAD: head: no deformity eyes: no periorbital swelling, no proptosis external nose and ears are normal mouth: no lesion seen NECK: supple, thyroid is not enlarged CHEST WALL: no deformity LUNGS: clear to auscultation BREASTS:  No gynecomastia CV: reg rate and rhythm, no murmur ABD: abdomen is soft, nontender.  no hepatosplenomegaly.  not distended.  no hernia RECTAL: normal external and internal exam.  heme neg. PROSTATE:  Normal size.  No nodule MUSCULOSKELETAL: muscle bulk and strength are grossly normal.  no obvious joint swelling.  gait is normal and steady EXTEMITIES: no deformity.  no ulcer on the feet.  feet are of normal  color and temp.  1+ bilat leg edema PULSES: dorsalis pedis intact bilat.  no carotid bruit. NEURO:  cn 2-12 grossly intact.   readily moves all 4's.  sensation is intact to touch on the feet. SKIN:  Normal texture and temperature.  No rash or suspicious lesion is visible.   NODES:  None palpable at the neck PSYCH: alert, well-oriented.  Does not appear anxious nor depressed.   i reviewed electrocardiogram.       Assessment & Plan:  Wellness visit today, with problems stable, except as noted. we discussed code status.  pt requests full code, but would not want to be started or maintained on artificial life-support measures if there was not a reasonable chance of recovery

## 2013-10-16 ENCOUNTER — Other Ambulatory Visit: Payer: Self-pay | Admitting: Endocrinology

## 2013-11-05 ENCOUNTER — Other Ambulatory Visit: Payer: Self-pay | Admitting: Endocrinology

## 2013-11-05 NOTE — Telephone Encounter (Signed)
Patient need refill of meds Advair, Pioglitazone, Losartan, Atorvastatin, send to [facility] on [address]

## 2013-11-19 ENCOUNTER — Other Ambulatory Visit: Payer: Self-pay | Admitting: Endocrinology

## 2013-12-21 ENCOUNTER — Other Ambulatory Visit: Payer: Self-pay | Admitting: Endocrinology

## 2014-01-15 ENCOUNTER — Other Ambulatory Visit: Payer: Self-pay | Admitting: Endocrinology

## 2014-01-25 ENCOUNTER — Other Ambulatory Visit: Payer: Self-pay

## 2014-01-25 MED ORDER — DILTIAZEM HCL 60 MG PO TABS: 60.0000 mg | ORAL_TABLET | Freq: Two times a day (BID) | ORAL | Status: DC

## 2014-01-27 ENCOUNTER — Other Ambulatory Visit: Payer: Self-pay | Admitting: Endocrinology

## 2014-02-26 ENCOUNTER — Other Ambulatory Visit: Payer: Self-pay | Admitting: Endocrinology

## 2014-02-26 DIAGNOSIS — M25562 Pain in left knee: Secondary | ICD-10-CM

## 2014-03-21 ENCOUNTER — Telehealth: Payer: Self-pay | Admitting: Endocrinology

## 2014-03-22 NOTE — Telephone Encounter (Signed)
Error

## 2014-03-26 ENCOUNTER — Other Ambulatory Visit: Payer: Self-pay | Admitting: Endocrinology

## 2014-03-26 ENCOUNTER — Telehealth: Payer: Self-pay

## 2014-03-26 NOTE — Telephone Encounter (Signed)
Pt advised of note below. Pt is coming in tomorrow at 7:45 to discuss referral. Pt states the referral (for knee surgery) is needed because his insurance has changed and now requires it.

## 2014-03-26 NOTE — Telephone Encounter (Signed)
F/u ov is due.  i'll ask patient

## 2014-03-26 NOTE — Telephone Encounter (Signed)
[facility] contacted office about referral from 02/26/2014. [facility] states they need to know why referral was placed and they will need up to date ov notes. Last time pt was seen in office and knee problem was addressed was 2012. I looked in chart and could not locate when the pt had requested the referral.  Please advise, Thanks!

## 2014-03-27 ENCOUNTER — Ambulatory Visit (INDEPENDENT_AMBULATORY_CARE_PROVIDER_SITE_OTHER): Payer: 59 | Admitting: Endocrinology

## 2014-03-27 ENCOUNTER — Encounter: Payer: Self-pay | Admitting: Endocrinology

## 2014-03-27 VITALS — BP 136/84 | HR 90 | Temp 98.6°F | Ht 71.0 in | Wt 257.0 lb

## 2014-03-27 DIAGNOSIS — E119 Type 2 diabetes mellitus without complications: Secondary | ICD-10-CM

## 2014-03-27 DIAGNOSIS — M25562 Pain in left knee: Secondary | ICD-10-CM

## 2014-03-27 LAB — SEDIMENTATION RATE: SED RATE: 10 mm/h (ref 0–22)

## 2014-03-27 LAB — HEMOGLOBIN A1C: Hgb A1c MFr Bld: 6.4 % (ref 4.6–6.5)

## 2014-03-27 LAB — URIC ACID: URIC ACID, SERUM: 3.7 mg/dL — AB (ref 4.0–7.8)

## 2014-03-27 NOTE — Progress Notes (Signed)
Subjective:    Patient ID: Edward Hawkins, male    DOB: [birth year], 64 y.o.   MRN: [id number]  HPI Pt states 1 year of moderate pain at the left knee, but no assoc numbness.  He had surgery there 1 year ago, but pain has recurred.   Past Medical History  Diagnosis Date  . DIABETES MELLITUS, TYPE II 10/08/2006  . HYPERLIPIDEMIA 10/08/2006  . HYPERTENSION 10/08/2006  . ASTHMA 10/08/2006  . CVA 11/11/2009  . RECTAL BLEEDING 09/15/2009  . Diverticulosis   . Colon polyps     tubular adenomas    Past Surgical History  Procedure Laterality Date  . Stress cardiolite  11/05/2005  . Transthoracic echocardiogram  10/12/2005  . Knee surgery      left knee    History   Social History  . Marital Status: Single    Spouse Name: N/A  . Number of Children: N/A  . Years of Education: N/A   Occupational History  . Works at [employer]  . Smoking status: Never Smoker   . Smokeless tobacco: Never Used  . Alcohol Use: No  . Drug Use: No  . Sexual Activity: Not on file   Other Topics Concern  . Not on file   Social History Narrative    Current Outpatient Prescriptions on File Prior to Visit  Medication Sig Dispense Refill  . ADVAIR DISKUS 250-50 MCG/DOSE AEPB INHALE 1 PUFF BY MOUTH ONCE DAILY 60 each 0  . aspirin 325 MG tablet Take 325 mg by mouth daily.      Marland Kitchen atorvastatin (LIPITOR) 20 MG tablet TAKE 1 TABLET (20 MG TOTAL) BY MOUTH DAILY. 90 tablet 3  . diltiazem (CARDIZEM) 60 MG tablet Take 1 tablet (60 mg total) by mouth 2 (two) times daily. 60 tablet 1  . fish oil-omega-3 fatty acids 1000 MG capsule Take 1 g by mouth daily.    Marland Kitchen losartan (COZAAR) 100 MG tablet TAKE 1 TABLET BY MOUTH ONCE A DAY 90 tablet 3  . MOVIPREP 100 G SOLR     . Multiple Vitamin (MULTIVITAMIN) tablet [facility] MVI 50 plus-Take one daily    . pioglitazone (ACTOS) 15 MG tablet TAKE 1 TABLET BY MOUTH ONCE A DAY 90 tablet 3  . traMADol (ULTRAM) 50 MG tablet 1 to 2 tablets by mouth every  6 hours as needed    . zafirlukast (ACCOLATE) 20 MG tablet TAKE 1 TABLET BY MOUTH TWICE A DAY 180 tablet 1   No current facility-administered medications on file prior to visit.    No Known Allergies  Family History  Problem Relation Age of Onset  . Cancer Brother     Lung Cancer  . Cancer Daughter     Breast Cancer  . Diabetes Mother   . Diabetes Father     BP 136/84 mmHg  Pulse 90  Temp(Src) 98.6 F (37 C) (Oral)  Ht 5\' 11"  (1.803 m)  Wt 257 lb (116.574 kg)  BMI 35.86 kg/m2  SpO2 95%    Review of Systems Denies fever and weight change.     Objective:   Physical Exam VITAL SIGNS:  See vs page GENERAL: no distress Pulses: dorsalis pedis intact bilat.   MSK: no deformity of the feet CV: no leg edema Skin:  no ulcer on the feet.  normal color and temp on the feet. Neuro: sensation is intact to touch on the feet Ext: left knee is normal to my exam Gait:  normal and steady.    Lab Results  Component Value Date   HGBA1C 6.4 03/27/2014  (i have reviewed orthopedic office note from 2013)    Assessment & Plan:  Knee pain, new to me DM: well-controlled   Patient is advised the following: Patient Instructions  blood tests are being requested for you today.  We'll let you know about the results. Please see a specialist.  you will receive a phone call, about a day and time for an appointment. Please come back for a regular physical appointment in 6 months

## 2014-03-27 NOTE — Patient Instructions (Signed)
blood tests are being requested for you today.  We'll let you know about the results. Please see a specialist.  you will receive a phone call, about a day and time for an appointment. Please come back for a regular physical appointment in 6 months

## 2014-04-08 ENCOUNTER — Other Ambulatory Visit: Payer: Self-pay | Admitting: Endocrinology

## 2014-05-13 ENCOUNTER — Other Ambulatory Visit: Payer: Self-pay | Admitting: Endocrinology

## 2014-05-17 ENCOUNTER — Ambulatory Visit (INDEPENDENT_AMBULATORY_CARE_PROVIDER_SITE_OTHER): Payer: 59 | Admitting: Endocrinology

## 2014-05-17 ENCOUNTER — Encounter: Payer: Self-pay | Admitting: Endocrinology

## 2014-05-17 VITALS — BP 138/86 | HR 86 | Temp 98.3°F | Ht 71.0 in | Wt 254.0 lb

## 2014-05-17 DIAGNOSIS — Z01818 Encounter for other preprocedural examination: Secondary | ICD-10-CM | POA: Diagnosis not present

## 2014-05-17 LAB — BASIC METABOLIC PANEL
BUN: 21 mg/dL (ref 6–23)
CALCIUM: 9.4 mg/dL (ref 8.4–10.5)
CO2: 26 mEq/L (ref 19–32)
CREATININE: 0.97 mg/dL (ref 0.40–1.50)
Chloride: 106 mEq/L (ref 96–112)
GFR: 82.75 mL/min (ref >60.00)
Glucose, Bld: 93 mg/dL (ref 70–99)
Potassium: 4.1 mEq/L (ref 3.5–5.1)
Sodium: 139 mEq/L (ref 135–145)

## 2014-05-17 LAB — HEMOGLOBIN A1C: HEMOGLOBIN A1C: 6.3 % (ref 4.6–6.5)

## 2014-05-17 NOTE — Patient Instructions (Signed)
blood tests and a chest-x-ray are requested for you today.  We'll let you know about the results. i'll see you next time.

## 2014-05-17 NOTE — Progress Notes (Signed)
Subjective:    Patient ID: Edward Hawkins, male    DOB: 03/22/1950, 64 y.o.   MRN: [id number]  HPI Denies cough and sob.  He needs preop cxr for left TRK in [facility] lennon. Past Medical History  Diagnosis Date  . DIABETES MELLITUS, TYPE II 10/08/2006  . HYPERLIPIDEMIA 10/08/2006  . HYPERTENSION 10/08/2006  . ASTHMA 10/08/2006  . CVA 11/11/2009  . RECTAL BLEEDING 09/15/2009  . Diverticulosis   . Colon polyps     tubular adenomas    Past Surgical History  Procedure Laterality Date  . Stress cardiolite  11/05/2005  . Transthoracic echocardiogram  10/12/2005  . Knee surgery      left knee    History   Social History  . Marital Status: Single    Spouse Name: N/A  . Number of Children: N/A  . Years of Education: N/A   Occupational History  . Works at [employer]  . Smoking status: Never Smoker   . Smokeless tobacco: Never Used  . Alcohol Use: No  . Drug Use: No  . Sexual Activity: Not on file   Other Topics Concern  . Not on file   Social History Narrative    Current Outpatient Prescriptions on File Prior to Visit  Medication Sig Dispense Refill  . ADVAIR DISKUS 250-50 MCG/DOSE AEPB INHALE 1 PUFF BY MOUTH ONCE DAILY 60 each 0  . aspirin 325 MG tablet Take 325 mg by mouth daily.      Marland Kitchen atorvastatin (LIPITOR) 20 MG tablet TAKE 1 TABLET (20 MG TOTAL) BY MOUTH DAILY. 90 tablet 3  . diltiazem (CARDIZEM) 60 MG tablet TAKE 1 TABLET BY MOUTH TWICE A DAY 60 tablet 1  . fish oil-omega-3 fatty acids 1000 MG capsule Take 1 g by mouth daily.    Marland Kitchen losartan (COZAAR) 100 MG tablet TAKE 1 TABLET BY MOUTH ONCE A DAY 90 tablet 3  . MOVIPREP 100 G SOLR     . Multiple Vitamin (MULTIVITAMIN) tablet [facility] MVI 50 plus-Take one daily    . pioglitazone (ACTOS) 15 MG tablet TAKE 1 TABLET BY MOUTH ONCE A DAY 90 tablet 3  . traMADol (ULTRAM) 50 MG tablet 1 to 2 tablets by mouth every 6 hours as needed    . zafirlukast (ACCOLATE) 20 MG tablet TAKE 1 TABLET BY MOUTH  TWICE A DAY 180 tablet 1   No current facility-administered medications on file prior to visit.    No Known Allergies  Family History  Problem Relation Age of Onset  . Cancer Brother     Lung Cancer  . Cancer Daughter     Breast Cancer  . Diabetes Mother   . Diabetes Father     BP 138/86 mmHg  Pulse 86  Temp(Src) 98.3 F (36.8 C) (Oral)  Ht 5\' 11"  (1.803 m)  Wt 254 lb (115.214 kg)  BMI 35.44 kg/m2  SpO2 95%  Review of Systems Denies chest pain    Objective:   Physical Exam VITAL SIGNS:  See vs page GENERAL: no distress LUNGS:  Clear to auscultation HEART:  Regular rate and rhythm without murmurs noted. Normal S1,S2.      CXR: NAD  Lab Results  Component Value Date   HGBA1C 6.3 05/17/2014   Lab Results  Component Value Date   CREATININE 0.97 05/17/2014   BUN 21 05/17/2014   NA 139 05/17/2014   K 4.1 05/17/2014   CL 106 05/17/2014   CO2 26 05/17/2014  Assessment & Plan:  DM: well-controlled.    Patient is advised the following: Patient Instructions  blood tests and a chest-x-ray are requested for you today.  We'll let you know about the results. i'll see you next time.

## 2014-05-20 ENCOUNTER — Ambulatory Visit (INDEPENDENT_AMBULATORY_CARE_PROVIDER_SITE_OTHER)
Admission: RE | Admit: 2014-05-20 | Discharge: 2014-05-20 | Disposition: A | Discharge status: Home / Self Care | Payer: 59 | Source: Ambulatory Visit | Attending: Endocrinology | Admitting: Endocrinology

## 2014-05-20 DIAGNOSIS — Z01818 Encounter for other preprocedural examination: Secondary | ICD-10-CM | POA: Diagnosis not present

## 2014-06-11 ENCOUNTER — Other Ambulatory Visit: Payer: Self-pay | Admitting: Endocrinology

## 2014-07-15 ENCOUNTER — Telehealth: Payer: Self-pay | Admitting: Endocrinology

## 2014-07-15 DIAGNOSIS — E119 Type 2 diabetes mellitus without complications: Secondary | ICD-10-CM

## 2014-07-15 NOTE — Telephone Encounter (Signed)
Edward Hawkins from [facility] called stated that patient need a referral before he is seen June 27th 8:25, [phone]

## 2014-07-15 NOTE — Telephone Encounter (Signed)
See note below and please advise, Thanks! 

## 2014-07-15 NOTE — Telephone Encounter (Signed)
done

## 2014-08-05 LAB — HM DIABETES EYE EXAM

## 2014-08-06 ENCOUNTER — Encounter: Payer: Self-pay | Admitting: Endocrinology

## 2014-08-14 ENCOUNTER — Other Ambulatory Visit: Payer: Self-pay

## 2014-08-14 ENCOUNTER — Telehealth: Payer: Self-pay | Admitting: Endocrinology

## 2014-08-14 MED ORDER — FLUTICASONE-SALMETEROL 250-50 MCG/DOSE IN AEPB: 1.0000 | INHALATION_SPRAY | Freq: Every day | RESPIRATORY_TRACT | Status: DC

## 2014-08-14 NOTE — Telephone Encounter (Signed)
Pt needs new advair disc called in to [facility] on [address] please

## 2014-08-22 ENCOUNTER — Other Ambulatory Visit: Payer: Self-pay

## 2014-08-22 MED ORDER — DILTIAZEM HCL 60 MG PO TABS: 60.0000 mg | ORAL_TABLET | Freq: Two times a day (BID) | ORAL | Status: DC

## 2014-08-23 ENCOUNTER — Telehealth: Payer: Self-pay | Admitting: Endocrinology

## 2014-08-23 MED ORDER — DILTIAZEM HCL 60 MG PO TABS: 60.0000 mg | ORAL_TABLET | Freq: Two times a day (BID) | ORAL | Status: DC

## 2014-08-23 NOTE — Telephone Encounter (Signed)
Pt needs refill on ditiazem , please send to [facility] on [address]

## 2014-08-23 NOTE — Telephone Encounter (Signed)
Rx sent per pt's request.  

## 2014-08-30 ENCOUNTER — Encounter: Payer: Self-pay | Admitting: Endocrinology

## 2014-09-25 ENCOUNTER — Ambulatory Visit: Payer: 59 | Admitting: Endocrinology

## 2014-10-03 ENCOUNTER — Encounter: Payer: Self-pay | Admitting: Endocrinology

## 2014-10-03 ENCOUNTER — Ambulatory Visit (INDEPENDENT_AMBULATORY_CARE_PROVIDER_SITE_OTHER): Payer: 59 | Admitting: Endocrinology

## 2014-10-03 VITALS — BP 138/88 | HR 91 | Temp 97.4°F | Ht 71.0 in | Wt 249.0 lb

## 2014-10-03 DIAGNOSIS — E119 Type 2 diabetes mellitus without complications: Secondary | ICD-10-CM | POA: Diagnosis not present

## 2014-10-03 DIAGNOSIS — Z0000 Encounter for general adult medical examination without abnormal findings: Secondary | ICD-10-CM | POA: Diagnosis not present

## 2014-10-03 DIAGNOSIS — Z125 Encounter for screening for malignant neoplasm of prostate: Secondary | ICD-10-CM

## 2014-10-03 LAB — LIPID PANEL
CHOLESTEROL: 159 mg/dL (ref 0–200)
HDL: 41.9 mg/dL (ref >39.00)
LDL Cholesterol: 104 mg/dL — ABNORMAL HIGH (ref 0–99)
NonHDL: 117.55
TRIGLYCERIDES: 68 mg/dL (ref 0.0–149.0)
Total CHOL/HDL Ratio: 4
VLDL: 13.6 mg/dL (ref 0.0–40.0)

## 2014-10-03 LAB — POCT GLYCOSYLATED HEMOGLOBIN (HGB A1C): HEMOGLOBIN A1C: 6.1

## 2014-10-03 LAB — URINALYSIS, ROUTINE W REFLEX MICROSCOPIC
HGB URINE DIPSTICK: NEGATIVE
KETONES UR: NEGATIVE
LEUKOCYTES UA: NEGATIVE
NITRITE: NEGATIVE
Specific Gravity, Urine: 1.02 (ref 1.000–1.030)
TOTAL PROTEIN, URINE-UPE24: NEGATIVE
UROBILINOGEN UA: 0.2 (ref 0.0–1.0)
Urine Glucose: NEGATIVE
pH: 6 (ref 5.0–8.0)

## 2014-10-03 LAB — CBC WITH DIFFERENTIAL/PLATELET
BASOS ABS: 0 10*3/uL (ref 0.0–0.1)
Basophils Relative: 0.5 % (ref 0.0–3.0)
EOS ABS: 0.6 10*3/uL (ref 0.0–0.7)
Eosinophils Relative: 6.8 % — ABNORMAL HIGH (ref 0.0–5.0)
HEMATOCRIT: 40.3 % (ref 39.0–52.0)
HEMOGLOBIN: 13.5 g/dL (ref 13.0–17.0)
LYMPHS PCT: 16.2 % (ref 12.0–46.0)
Lymphs Abs: 1.5 10*3/uL (ref 0.7–4.0)
MCHC: 33.5 g/dL (ref 30.0–36.0)
MCV: 97.5 fl (ref 78.0–100.0)
MONOS PCT: 11 % (ref 3.0–12.0)
Monocytes Absolute: 1 10*3/uL (ref 0.1–1.0)
Neutro Abs: 6.3 10*3/uL (ref 1.4–7.7)
Neutrophils Relative %: 65.5 % (ref 43.0–77.0)
Platelets: 345 10*3/uL (ref 150.0–400.0)
RBC: 4.13 Mil/uL — AB (ref 4.22–5.81)
RDW: 13.8 % (ref 11.5–15.5)
WBC: 9.6 10*3/uL (ref 4.0–10.5)

## 2014-10-03 LAB — BASIC METABOLIC PANEL
BUN: 25 mg/dL — AB (ref 6–23)
CALCIUM: 9.2 mg/dL (ref 8.4–10.5)
CO2: 27 meq/L (ref 19–32)
CREATININE: 1.1 mg/dL (ref 0.40–1.50)
Chloride: 104 mEq/L (ref 96–112)
GFR: 71.49 mL/min (ref >60.00)
GLUCOSE: 108 mg/dL — AB (ref 70–99)
Potassium: 4.8 mEq/L (ref 3.5–5.1)
SODIUM: 139 meq/L (ref 135–145)

## 2014-10-03 LAB — MICROALBUMIN / CREATININE URINE RATIO
Creatinine,U: 162.7 mg/dL
MICROALB/CREAT RATIO: 0.4 mg/g (ref 0.0–30.0)

## 2014-10-03 LAB — PSA: PSA: 0.6 ng/mL (ref 0.10–4.00)

## 2014-10-03 LAB — TSH: TSH: 2.61 u[IU]/mL (ref 0.35–4.50)

## 2014-10-03 LAB — HEPATIC FUNCTION PANEL
ALT: 15 U/L (ref 0–53)
AST: 13 U/L (ref 0–37)
Albumin: 4.1 g/dL (ref 3.5–5.2)
Alkaline Phosphatase: 93 U/L (ref 39–117)
BILIRUBIN DIRECT: 0.2 mg/dL (ref 0.0–0.3)
BILIRUBIN TOTAL: 0.6 mg/dL (ref 0.2–1.2)
Total Protein: 7.1 g/dL (ref 6.0–8.3)

## 2014-10-03 MED ORDER — TRAZODONE HCL 100 MG PO TABS: 100.0000 mg | ORAL_TABLET | Freq: Every day | ORAL | Status: DC

## 2014-10-03 NOTE — Progress Notes (Signed)
Subjective:    Patient ID: Edward Hawkins, male    DOB: 1950/09/20, 64 y.o.   MRN: [id number]  HPI Pt is here for regular wellness examination, and is feeling pretty well in general, and says chronic med probs are stable, except as noted below Past Medical History  Diagnosis Date  . DIABETES MELLITUS, TYPE II 10/08/2006  . HYPERLIPIDEMIA 10/08/2006  . HYPERTENSION 10/08/2006  . ASTHMA 10/08/2006  . CVA 11/11/2009  . RECTAL BLEEDING 09/15/2009  . Diverticulosis   . Colon polyps     tubular adenomas    Past Surgical History  Procedure Laterality Date  . Stress cardiolite  11/05/2005  . Transthoracic echocardiogram  10/12/2005  . Knee surgery      left knee    Social History   Social History  . Marital Status: Single    Spouse Name: N/A  . Number of Children: N/A  . Years of Education: N/A   Occupational History  . Works at [employer]  . Smoking status: Never Smoker   . Smokeless tobacco: Never Used  . Alcohol Use: No  . Drug Use: No  . Sexual Activity: Not on file   Other Topics Concern  . Not on file   Social History Narrative    Current Outpatient Prescriptions on File Prior to Visit  Medication Sig Dispense Refill  . aspirin 325 MG tablet Take 325 mg by mouth daily.      Marland Kitchen atorvastatin (LIPITOR) 20 MG tablet TAKE 1 TABLET (20 MG TOTAL) BY MOUTH DAILY. 90 tablet 3  . diltiazem (CARDIZEM) 60 MG tablet Take 1 tablet (60 mg total) by mouth 2 (two) times daily. 60 tablet 1  . fish oil-omega-3 fatty acids 1000 MG capsule Take 1 g by mouth daily.    . Fluticasone-Salmeterol (ADVAIR DISKUS) 250-50 MCG/DOSE AEPB Inhale 1 puff into the lungs daily. 60 each 0  . losartan (COZAAR) 100 MG tablet TAKE 1 TABLET BY MOUTH ONCE A DAY 90 tablet 3  . MOVIPREP 100 G SOLR     . Multiple Vitamin (MULTIVITAMIN) tablet [employer] MVI 50 plus-Take one daily    . pioglitazone (ACTOS) 15 MG tablet TAKE 1 TABLET BY MOUTH ONCE A DAY 90 tablet 3  . zafirlukast  (ACCOLATE) 20 MG tablet TAKE 1 TABLET BY MOUTH TWICE A DAY 180 tablet 1   No current facility-administered medications on file prior to visit.    No Known Allergies  Family History  Problem Relation Age of Onset  . Cancer Brother     Lung Cancer  . Cancer Daughter     Breast Cancer  . Diabetes Mother   . Diabetes Father     BP 138/88 mmHg  Pulse 91  Temp(Src) 97.4 F (36.3 C) (Oral)  Ht 5\' 11"  (1.803 m)  Wt 249 lb (112.946 kg)  BMI 34.74 kg/m2  SpO2 97%  Review of Systems  Constitutional: Negative for fever.  HENT: Negative for hearing loss.   Eyes: Negative for visual disturbance.  Respiratory: Negative for shortness of breath.   Cardiovascular: Negative for chest pain.  Gastrointestinal: Negative for blood in stool.  Endocrine: Negative for cold intolerance.  Genitourinary: Negative for hematuria and difficulty urinating.  Musculoskeletal: Negative for back pain.  Skin: Negative for rash.  Allergic/Immunologic: Negative for environmental allergies.  Neurological: Negative for syncope and numbness.  Hematological: Does not bruise/bleed easily.  Psychiatric/Behavioral: Negative for dysphoric mood.      Objective:  Physical Exam VS: see vs page GEN: no distress HEAD: head: no deformity eyes: no periorbital swelling, no proptosis external nose and ears are normal mouth: no lesion seen NECK: supple, thyroid is not enlarged CHEST WALL: no deformity LUNGS: clear to auscultation BREASTS:  No gynecomastia CV: reg rate and rhythm, no murmur ABD: abdomen is soft, nontender.  no hepatosplenomegaly.  not distended.  no hernia.    RECTAL: normal external and internal exam.  heme neg. PROSTATE:  Normal size.  No nodule. MUSCULOSKELETAL: muscle bulk and strength are grossly normal.  no obvious joint swelling.  gait is normal and steady EXTEMITIES: no deformity.  no ulcer on the feet.  feet are of normal color and temp.  no edema PULSES: dorsalis pedis intact bilat.  no  carotid bruit NEURO:  cn 2-12 grossly intact.   readily moves all 4's.  sensation is intact to touch on the feet SKIN:  Normal texture and temperature.  No rash or suspicious lesion is visible.   NODES:  None palpable at the neck   i personally reviewed electrocardiogram tracing: normal.     Assessment & Plan:  Wellness visit today, with problems stable, except as noted.  SEPARATE EVALUATION FOLLOWS--EACH PROBLEM HERE IS NEW, NOT RESPONDING TO TREATMENT, OR POSES SIGNIFICANT RISK TO THE PATIENT'S HEALTH: HISTORY OF THE PRESENT ILLNESS: Pt reports insomnia, which he attributes to residual knee pain PAST MEDICAL HISTORY reviewed and up to date today. REVIEW OF SYSTEMS: Denies weight change PHYSICAL EXAMINATION: VITAL SIGNS:  See vs page GENERAL: no distress PSYCH: alert, well-oriented.  Does not appear anxious nor depressed. LAB/XRAY RESULTS: Lab Results  Component Value Date   CHOL 159 10/03/2014   HDL 41.90 10/03/2014   LDLCALC 104* 10/03/2014   TRIG 68.0 10/03/2014   CHOLHDL 4 10/03/2014   Lab Results  Component Value Date   TSH 2.61 10/03/2014  IMPRESSION: Insomnia Dyslipidemia, worse PLAN: i have sent a prescription to your pharmacy, for desyrel If you sometimes miss the lipitor, take qd.  If you take it daily, please let us know, so we can increase it.

## 2014-10-03 NOTE — Patient Instructions (Addendum)
please consider these measures for your health:  minimize alcohol.  do not use tobacco products.  have a colonoscopy at least every 10 years from age 64.  keep firearms safely stored.  always use seat belts.  have working smoke alarms in your home.  see an eye doctor and dentist regularly.  never drive under the influence of alcohol or drugs (including prescription drugs).  those with fair skin should take precautions against the sun. blood tests are requested for you today.  We'll let you know about the results. Please ask your insurance if they pay for the shingles vaccine.  Let us know if you want to get it.  i have sent a prescription to your pharmacy, for the sleep Please return in 1 year.

## 2014-10-03 NOTE — Progress Notes (Signed)
we discussed code status.  pt requests full code, but would not want to be started or maintained on artificial life-support measures if there was not a reasonable chance of recovery 

## 2014-10-07 ENCOUNTER — Telehealth: Payer: Self-pay | Admitting: Endocrinology

## 2014-10-07 NOTE — Telephone Encounter (Signed)
Patient called stating that he would like cough syrup   Please advise   Thank you

## 2014-10-07 NOTE — Telephone Encounter (Signed)
See note below and please advise, Thanks! 

## 2014-10-07 NOTE — Telephone Encounter (Signed)
i need to know more: How long do you have this? Fever? Sob?

## 2014-10-08 NOTE — Telephone Encounter (Signed)
Requested a call back from the pt to discuss.  

## 2014-10-08 NOTE — Telephone Encounter (Signed)
Requested call back from the pt.  

## 2014-10-09 NOTE — Telephone Encounter (Signed)
Patient called stating he no longer needs the cough syrup as he took over the counter medication and feels much better  He would like Dr. Loanne Drilling to know that the sleep medication is not working for him   Please advise patient    Thank you

## 2014-10-09 NOTE — Telephone Encounter (Signed)
Pt advised of note below and voiced understanding.  

## 2014-10-09 NOTE — Telephone Encounter (Signed)
Please try taking 2 or 3 of the trazodone pills.  Then call us to tell us how it is working, so i can change prescription

## 2014-10-09 NOTE — Telephone Encounter (Signed)
See note below and please advise, Thanks! 

## 2014-10-16 ENCOUNTER — Telehealth: Payer: Self-pay | Admitting: Endocrinology

## 2014-10-16 DIAGNOSIS — M25562 Pain in left knee: Secondary | ICD-10-CM

## 2014-10-16 NOTE — Telephone Encounter (Signed)
See note below and please advise, Thanks! 

## 2014-10-16 NOTE — Telephone Encounter (Signed)
I contacted the pt and advised referral has been placed.

## 2014-10-16 NOTE — Telephone Encounter (Signed)
Pt needs a referral for Dr Karlton Lemon  For second opinion for surgery

## 2014-10-16 NOTE — Telephone Encounter (Signed)
done

## 2014-10-24 ENCOUNTER — Telehealth: Payer: Self-pay | Admitting: Endocrinology

## 2014-10-24 DIAGNOSIS — E119 Type 2 diabetes mellitus without complications: Secondary | ICD-10-CM

## 2014-10-24 DIAGNOSIS — Z125 Encounter for screening for malignant neoplasm of prostate: Secondary | ICD-10-CM

## 2014-10-24 DIAGNOSIS — Z0000 Encounter for general adult medical examination without abnormal findings: Secondary | ICD-10-CM

## 2014-10-24 NOTE — Telephone Encounter (Signed)
Patient called and would like refill on his medications   Rx:  Advair  Accolate  And sleeping medication that he could not remember   Pharmacy: [facility]    Thank you

## 2014-10-25 MED ORDER — TRAZODONE HCL 100 MG PO TABS: 100.0000 mg | ORAL_TABLET | Freq: Every day | ORAL | Status: DC

## 2014-10-25 MED ORDER — ZAFIRLUKAST 20 MG PO TABS: 20.0000 mg | ORAL_TABLET | Freq: Two times a day (BID) | ORAL | Status: DC

## 2014-10-25 MED ORDER — FLUTICASONE-SALMETEROL 250-50 MCG/DOSE IN AEPB: 1.0000 | INHALATION_SPRAY | Freq: Every day | RESPIRATORY_TRACT | Status: DC

## 2014-10-25 NOTE — Telephone Encounter (Signed)
Rx for Advair, Accolate and Trazadone sent per pt's request.

## 2014-10-29 ENCOUNTER — Encounter: Payer: Self-pay | Admitting: Internal Medicine

## 2014-12-04 ENCOUNTER — Other Ambulatory Visit: Payer: Self-pay | Admitting: Endocrinology

## 2015-01-05 ENCOUNTER — Other Ambulatory Visit: Payer: Self-pay | Admitting: Endocrinology

## 2015-01-13 ENCOUNTER — Other Ambulatory Visit: Payer: Self-pay | Admitting: Endocrinology

## 2015-02-05 ENCOUNTER — Other Ambulatory Visit: Payer: Self-pay | Admitting: Endocrinology

## 2015-03-09 ENCOUNTER — Other Ambulatory Visit: Payer: Self-pay | Admitting: Endocrinology

## 2015-03-11 DIAGNOSIS — M170 Bilateral primary osteoarthritis of knee: Secondary | ICD-10-CM | POA: Diagnosis not present

## 2015-03-11 DIAGNOSIS — M25562 Pain in left knee: Secondary | ICD-10-CM | POA: Diagnosis not present

## 2015-03-11 DIAGNOSIS — R262 Difficulty in walking, not elsewhere classified: Secondary | ICD-10-CM | POA: Diagnosis not present

## 2015-03-11 DIAGNOSIS — M1712 Unilateral primary osteoarthritis, left knee: Secondary | ICD-10-CM | POA: Diagnosis not present

## 2015-03-19 DIAGNOSIS — M25562 Pain in left knee: Secondary | ICD-10-CM | POA: Diagnosis not present

## 2015-03-19 DIAGNOSIS — M1712 Unilateral primary osteoarthritis, left knee: Secondary | ICD-10-CM | POA: Diagnosis not present

## 2015-03-26 DIAGNOSIS — M25562 Pain in left knee: Secondary | ICD-10-CM | POA: Diagnosis not present

## 2015-03-26 DIAGNOSIS — M1712 Unilateral primary osteoarthritis, left knee: Secondary | ICD-10-CM | POA: Diagnosis not present

## 2015-03-31 ENCOUNTER — Other Ambulatory Visit: Payer: Self-pay | Admitting: Endocrinology

## 2015-04-02 DIAGNOSIS — M25562 Pain in left knee: Secondary | ICD-10-CM | POA: Diagnosis not present

## 2015-04-02 DIAGNOSIS — M1712 Unilateral primary osteoarthritis, left knee: Secondary | ICD-10-CM | POA: Diagnosis not present

## 2015-04-09 ENCOUNTER — Other Ambulatory Visit: Payer: Self-pay | Admitting: Endocrinology

## 2015-04-09 DIAGNOSIS — M25562 Pain in left knee: Secondary | ICD-10-CM | POA: Diagnosis not present

## 2015-04-09 DIAGNOSIS — M1712 Unilateral primary osteoarthritis, left knee: Secondary | ICD-10-CM | POA: Diagnosis not present

## 2015-05-12 ENCOUNTER — Other Ambulatory Visit: Payer: Self-pay | Admitting: Endocrinology

## 2015-05-28 ENCOUNTER — Telehealth: Payer: Self-pay | Admitting: Endocrinology

## 2015-05-28 MED ORDER — PIOGLITAZONE HCL 15 MG PO TABS: 15.0000 mg | ORAL_TABLET | Freq: Every day | ORAL | Status: DC

## 2015-05-28 MED ORDER — LOSARTAN POTASSIUM 100 MG PO TABS: 100.0000 mg | ORAL_TABLET | Freq: Every day | ORAL | Status: DC

## 2015-05-28 MED ORDER — ATORVASTATIN CALCIUM 20 MG PO TABS: ORAL_TABLET | ORAL | Status: DC

## 2015-05-28 NOTE — Telephone Encounter (Signed)
[facility] on [address] call in rx atorvastatin, losartan, pioglitazone

## 2015-05-28 NOTE — Telephone Encounter (Signed)
Rx submitted

## 2015-06-09 ENCOUNTER — Other Ambulatory Visit: Payer: Self-pay | Admitting: Endocrinology

## 2015-07-15 ENCOUNTER — Other Ambulatory Visit: Payer: Self-pay | Admitting: Endocrinology

## 2015-07-23 DIAGNOSIS — M1712 Unilateral primary osteoarthritis, left knee: Secondary | ICD-10-CM | POA: Diagnosis not present

## 2015-07-23 DIAGNOSIS — M25562 Pain in left knee: Secondary | ICD-10-CM | POA: Diagnosis not present

## 2015-07-23 DIAGNOSIS — R52 Pain, unspecified: Secondary | ICD-10-CM | POA: Diagnosis not present

## 2015-08-07 DIAGNOSIS — H25013 Cortical age-related cataract, bilateral: Secondary | ICD-10-CM | POA: Diagnosis not present

## 2015-08-07 DIAGNOSIS — E119 Type 2 diabetes mellitus without complications: Secondary | ICD-10-CM | POA: Diagnosis not present

## 2015-08-07 DIAGNOSIS — H25011 Cortical age-related cataract, right eye: Secondary | ICD-10-CM | POA: Diagnosis not present

## 2015-08-07 DIAGNOSIS — H2511 Age-related nuclear cataract, right eye: Secondary | ICD-10-CM | POA: Diagnosis not present

## 2015-08-07 DIAGNOSIS — H2513 Age-related nuclear cataract, bilateral: Secondary | ICD-10-CM | POA: Diagnosis not present

## 2015-08-19 ENCOUNTER — Other Ambulatory Visit: Payer: Self-pay | Admitting: Endocrinology

## 2015-08-27 DIAGNOSIS — H25012 Cortical age-related cataract, left eye: Secondary | ICD-10-CM | POA: Diagnosis not present

## 2015-08-27 DIAGNOSIS — H2511 Age-related nuclear cataract, right eye: Secondary | ICD-10-CM | POA: Diagnosis not present

## 2015-08-27 DIAGNOSIS — H2512 Age-related nuclear cataract, left eye: Secondary | ICD-10-CM | POA: Diagnosis not present

## 2015-08-27 DIAGNOSIS — H25011 Cortical age-related cataract, right eye: Secondary | ICD-10-CM | POA: Diagnosis not present

## 2015-09-03 DIAGNOSIS — H25012 Cortical age-related cataract, left eye: Secondary | ICD-10-CM | POA: Diagnosis not present

## 2015-09-03 DIAGNOSIS — H2512 Age-related nuclear cataract, left eye: Secondary | ICD-10-CM | POA: Diagnosis not present

## 2015-10-03 ENCOUNTER — Encounter: Payer: Self-pay | Admitting: Endocrinology

## 2015-10-03 ENCOUNTER — Other Ambulatory Visit (INDEPENDENT_AMBULATORY_CARE_PROVIDER_SITE_OTHER): Payer: PPO

## 2015-10-03 ENCOUNTER — Ambulatory Visit (INDEPENDENT_AMBULATORY_CARE_PROVIDER_SITE_OTHER): Payer: PPO | Admitting: Endocrinology

## 2015-10-03 ENCOUNTER — Encounter: Payer: Self-pay | Admitting: Internal Medicine

## 2015-10-03 VITALS — BP 134/84 | HR 76 | Wt 249.0 lb

## 2015-10-03 DIAGNOSIS — Z125 Encounter for screening for malignant neoplasm of prostate: Secondary | ICD-10-CM | POA: Diagnosis not present

## 2015-10-03 DIAGNOSIS — E119 Type 2 diabetes mellitus without complications: Secondary | ICD-10-CM

## 2015-10-03 DIAGNOSIS — E785 Hyperlipidemia, unspecified: Secondary | ICD-10-CM

## 2015-10-03 DIAGNOSIS — Z23 Encounter for immunization: Secondary | ICD-10-CM

## 2015-10-03 DIAGNOSIS — I10 Essential (primary) hypertension: Secondary | ICD-10-CM

## 2015-10-03 DIAGNOSIS — Z0000 Encounter for general adult medical examination without abnormal findings: Secondary | ICD-10-CM

## 2015-10-03 LAB — CBC WITH DIFFERENTIAL/PLATELET
Basophils Absolute: 0 10*3/uL (ref 0.0–0.1)
Basophils Relative: 0.4 % (ref 0.0–3.0)
Eosinophils Absolute: 0.1 10*3/uL (ref 0.0–0.7)
Eosinophils Relative: 1.7 % (ref 0.0–5.0)
HCT: 40.4 % (ref 39.0–52.0)
Hemoglobin: 13.8 g/dL (ref 13.0–17.0)
Lymphocytes Relative: 17.5 % (ref 12.0–46.0)
Lymphs Abs: 1.2 10*3/uL (ref 0.7–4.0)
MCHC: 34.2 g/dL (ref 30.0–36.0)
MCV: 95.4 fl (ref 78.0–100.0)
Monocytes Absolute: 0.7 10*3/uL (ref 0.1–1.0)
Monocytes Relative: 9.4 % (ref 3.0–12.0)
Neutro Abs: 5 10*3/uL (ref 1.4–7.7)
Neutrophils Relative %: 71 % (ref 43.0–77.0)
Platelets: 291 10*3/uL (ref 150.0–400.0)
RBC: 4.23 Mil/uL (ref 4.22–5.81)
RDW: 13.9 % (ref 11.5–15.5)
WBC: 7.1 10*3/uL (ref 4.0–10.5)

## 2015-10-03 LAB — URINALYSIS, ROUTINE W REFLEX MICROSCOPIC
Bilirubin Urine: NEGATIVE
Hgb urine dipstick: NEGATIVE
Ketones, ur: NEGATIVE
Leukocytes, UA: NEGATIVE
Nitrite: NEGATIVE
RBC / HPF: NONE SEEN
Specific Gravity, Urine: >=1.03 — AB
Total Protein, Urine: NEGATIVE
Urine Glucose: NEGATIVE
Urobilinogen, UA: 0.2
WBC, UA: NONE SEEN
pH: 5.5 (ref 5.0–8.0)

## 2015-10-03 LAB — BASIC METABOLIC PANEL
BUN: 17 mg/dL (ref 6–23)
CALCIUM: 9 mg/dL (ref 8.4–10.5)
CHLORIDE: 106 meq/L (ref 96–112)
CO2: 27 mEq/L (ref 19–32)
CREATININE: 1.01 mg/dL (ref 0.40–1.50)
GFR: 78.64 mL/min (ref >60.00)
Glucose, Bld: 120 mg/dL — ABNORMAL HIGH (ref 70–99)
Potassium: 4.4 mEq/L (ref 3.5–5.1)
Sodium: 141 mEq/L (ref 135–145)

## 2015-10-03 LAB — LIPID PANEL
Cholesterol: 137 mg/dL (ref 0–200)
HDL: 42.8 mg/dL
LDL Cholesterol: 78 mg/dL (ref 0–99)
NonHDL: 94.14
Total CHOL/HDL Ratio: 3
Triglycerides: 81 mg/dL (ref 0.0–149.0)
VLDL: 16.2 mg/dL (ref 0.0–40.0)

## 2015-10-03 LAB — MICROALBUMIN / CREATININE URINE RATIO
CREATININE, U: 289.6 mg/dL
Microalb Creat Ratio: 0.3 mg/g (ref 0.0–30.0)
Microalb, Ur: 1 mg/dL (ref 0.0–1.9)

## 2015-10-03 LAB — TSH: TSH: 2.11 u[IU]/mL (ref 0.35–4.50)

## 2015-10-03 LAB — PSA: PSA: 0.72 ng/mL (ref 0.10–4.00)

## 2015-10-03 LAB — POCT GLYCOSYLATED HEMOGLOBIN (HGB A1C): HEMOGLOBIN A1C: 6

## 2015-10-03 NOTE — Progress Notes (Signed)
we discussed code status.  pt requests full code, but would not want to be started or maintained on artificial life-support measures if there was not a reasonable chance of recovery 

## 2015-10-03 NOTE — Progress Notes (Signed)
Subjective:    Patient ID: Edward Hawkins, male    DOB: 17-Jun-1950, 65 y.o.   MRN: [id number]  HPI Pt is here for regular wellness examination, and is feeling pretty well in general, and says chronic med probs are stable, except as noted below Past Medical History:  Diagnosis Date  . ASTHMA 10/08/2006  . Colon polyps    tubular adenomas  . CVA 11/11/2009  . DIABETES MELLITUS, TYPE II 10/08/2006  . Diverticulosis   . HYPERLIPIDEMIA 10/08/2006  . HYPERTENSION 10/08/2006  . RECTAL BLEEDING 09/15/2009    Past Surgical History:  Procedure Laterality Date  . KNEE SURGERY     left knee  . Stress Cardiolite  11/05/2005  . TRANSTHORACIC ECHOCARDIOGRAM  10/12/2005    Social History   Social History  . Marital status: Single    Spouse name: N/A  . Number of children: N/A  . Years of education: N/A   Occupational History  . Works at [employer]  . Smoking status: Never Smoker  . Smokeless tobacco: Never Used  . Alcohol use No  . Drug use: No  . Sexual activity: Not on file   Other Topics Concern  . Not on file   Social History Narrative  . No narrative on file    Current Outpatient Prescriptions on File Prior to Visit  Medication Sig Dispense Refill  . ADVAIR DISKUS 250-50 MCG/DOSE AEPB INHALE ONE DOSE BY MOUTH ONCE DAILY 60 each 1  . aspirin 325 MG tablet Take 325 mg by mouth daily.      Marland Kitchen atorvastatin (LIPITOR) 20 MG tablet TAKE 1 TABLET (20 MG TOTAL) BY MOUTH DAILY. 90 tablet 3  . diltiazem (CARDIZEM) 60 MG tablet TAKE ONE TABLET BY MOUTH TWICE DAILY 60 tablet 2  . fish oil-omega-3 fatty acids 1000 MG capsule Take 1 g by mouth daily.    Marland Kitchen losartan (COZAAR) 100 MG tablet Take 1 tablet (100 mg total) by mouth daily. 90 tablet 3  . MOVIPREP 100 G SOLR     . Multiple Vitamin (MULTIVITAMIN) tablet [employer] MVI 50 plus-Take one daily    . pioglitazone (ACTOS) 15 MG tablet Take 1 tablet (15 mg total) by mouth daily. 30 tablet 0  . pioglitazone  (ACTOS) 15 MG tablet Take 1 tablet (15 mg total) by mouth daily. 90 tablet 3  . traZODone (DESYREL) 100 MG tablet Take 1 tablet (100 mg total) by mouth at bedtime. 30 tablet 11  . zafirlukast (ACCOLATE) 20 MG tablet Take 1 tablet (20 mg total) by mouth 2 (two) times daily. 180 tablet 1   No current facility-administered medications on file prior to visit.     No Known Allergies  Family History  Problem Relation Age of Onset  . Cancer Brother     Lung Cancer  . Cancer Daughter     Breast Cancer  . Diabetes Mother   . Diabetes Father     BP 134/84   Pulse 76   Wt 249 lb (112.9 kg)   BMI 34.73 kg/m   Review of Systems  Constitutional: Negative for fever and unexpected weight change.  HENT: Negative for hearing loss.   Eyes: Negative for visual disturbance.  Respiratory: Negative for shortness of breath.   Cardiovascular: Negative for chest pain.  Gastrointestinal: Negative for blood in stool.  Endocrine: Negative for cold intolerance.  Genitourinary: Negative for difficulty urinating and hematuria.  Musculoskeletal: Negative for back pain.  Skin: Negative for  rash.  Allergic/Immunologic: Negative for environmental allergies.  Neurological: Negative for syncope and numbness.  Hematological: Does not bruise/bleed easily.  Psychiatric/Behavioral: Negative for dysphoric mood.       Objective:   Physical Exam VS: see vs page GEN: no distress HEAD: head: no deformity eyes: no periorbital swelling, no proptosis external nose and ears are normal mouth: no lesion seen NECK: supple, thyroid is not enlarged CHEST WALL: no deformity LUNGS: clear to auscultation BREASTS:  No gynecomastia CV: reg rate and rhythm, no murmur.  ABD: abdomen is soft, nontender.  no hepatosplenomegaly.  not distended.  no hernia GENITALIA/RECTAL/PROSTATE: declined MUSCULOSKELETAL: muscle bulk and strength are grossly normal.  no obvious joint swelling.  gait is normal and steady EXTEMITIES: no  deformity.  no ulcer on the feet.  feet are of normal color and temp.  no edema PULSES: dorsalis pedis intact bilat.  no carotid bruit NEURO:  cn 2-12 grossly intact.   readily moves all 4's.  sensation is intact to touch on the feet SKIN:  Normal texture and temperature.  No rash or suspicious lesion is visible.   NODES:  None palpable at the neck.   PSYCH: alert, well-oriented.  Does not appear anxious nor depressed.   i personally reviewed electrocardiogram tracing (today): Indication: DM Impression: low voltage.    Assessment & Plan:  Wellness visit today, with problems stable, except as noted.

## 2015-10-03 NOTE — Patient Instructions (Signed)
good diet and exercise significantly improve the control of your diabetes.  please let me know if you wish to be referred to a dietician.  high blood sugar is very risky to your health.  you should see an eye doctor and dentist every year.  It is very important to get all recommended vaccinations.  It is critically important to prevent falling down (keep floor areas well-lit, dry, and free of loose objects.  If you have a cane, walker, or wheelchair, you should use it, even for short trips around the house.  Wear flat-soled shoes.  Also, try not to rush). blood tests are requested for you today.  We'll let you know about the results. Please return in 1 year.

## 2015-10-04 LAB — HEPATITIS C ANTIBODY: HCV AB: NEGATIVE

## 2015-10-07 ENCOUNTER — Telehealth: Payer: Self-pay

## 2015-10-07 NOTE — Telephone Encounter (Signed)
Called and left message that patient lab results came back and were normal, advised patient to call back if any questions, gave call back number.

## 2015-10-15 ENCOUNTER — Other Ambulatory Visit: Payer: Self-pay | Admitting: Endocrinology

## 2015-10-27 DIAGNOSIS — M25562 Pain in left knee: Secondary | ICD-10-CM | POA: Diagnosis not present

## 2015-10-27 DIAGNOSIS — M1712 Unilateral primary osteoarthritis, left knee: Secondary | ICD-10-CM | POA: Diagnosis not present

## 2015-11-11 DIAGNOSIS — H52211 Irregular astigmatism, right eye: Secondary | ICD-10-CM | POA: Diagnosis not present

## 2015-11-11 DIAGNOSIS — Z9889 Other specified postprocedural states: Secondary | ICD-10-CM | POA: Diagnosis not present

## 2015-11-11 DIAGNOSIS — Z961 Presence of intraocular lens: Secondary | ICD-10-CM | POA: Diagnosis not present

## 2015-11-26 ENCOUNTER — Other Ambulatory Visit: Payer: Self-pay | Admitting: Endocrinology

## 2015-11-27 ENCOUNTER — Ambulatory Visit (AMBULATORY_SURGERY_CENTER): Payer: Self-pay | Admitting: *Deleted

## 2015-11-27 VITALS — Ht 71.0 in | Wt 252.0 lb

## 2015-11-27 DIAGNOSIS — Z86010 Personal history of colonic polyps: Secondary | ICD-10-CM

## 2015-11-27 MED ORDER — NA SULFATE-K SULFATE-MG SULF 17.5-3.13-1.6 GM/177ML PO SOLN: 1.0000 | Freq: Once | ORAL | 0 refills | Status: AC

## 2015-11-27 NOTE — Progress Notes (Signed)
No egg or soy allergy. No anesthesia problems.  No home O2.  No diet meds.  

## 2015-12-01 ENCOUNTER — Encounter: Payer: Self-pay | Admitting: Internal Medicine

## 2015-12-11 ENCOUNTER — Ambulatory Visit (AMBULATORY_SURGERY_CENTER): Payer: PPO | Admitting: Internal Medicine

## 2015-12-11 ENCOUNTER — Encounter: Payer: Self-pay | Admitting: Internal Medicine

## 2015-12-11 VITALS — BP 101/68 | HR 61 | Temp 98.4°F | Resp 11 | Ht 71.0 in | Wt 252.0 lb

## 2015-12-11 DIAGNOSIS — Z86010 Personal history of colonic polyps: Secondary | ICD-10-CM | POA: Diagnosis not present

## 2015-12-11 DIAGNOSIS — D120 Benign neoplasm of cecum: Secondary | ICD-10-CM

## 2015-12-11 DIAGNOSIS — Z8673 Personal history of transient ischemic attack (TIA), and cerebral infarction without residual deficits: Secondary | ICD-10-CM | POA: Diagnosis not present

## 2015-12-11 DIAGNOSIS — I10 Essential (primary) hypertension: Secondary | ICD-10-CM | POA: Diagnosis not present

## 2015-12-11 DIAGNOSIS — J45909 Unspecified asthma, uncomplicated: Secondary | ICD-10-CM | POA: Diagnosis not present

## 2015-12-11 DIAGNOSIS — F329 Major depressive disorder, single episode, unspecified: Secondary | ICD-10-CM | POA: Diagnosis not present

## 2015-12-11 DIAGNOSIS — E119 Type 2 diabetes mellitus without complications: Secondary | ICD-10-CM | POA: Diagnosis not present

## 2015-12-11 LAB — GLUCOSE, CAPILLARY
GLUCOSE-CAPILLARY: 129 mg/dL — AB (ref 65–99)
Glucose-Capillary: 114 mg/dL — ABNORMAL HIGH (ref 65–99)

## 2015-12-11 MED ORDER — SODIUM CHLORIDE 0.9 % IV SOLN: 500.0000 mL | INTRAVENOUS | Status: DC

## 2015-12-11 NOTE — Patient Instructions (Signed)
Impression/Recommendations:  Polyps handout given to patient. Diverticulosis handout given to patient. Hemorrhoid handout given to patent.  Repeat colonoscopy in 5 years for surveillance.YOU HAD AN ENDOSCOPIC PROCEDURE TODAY AT [facility]:   Refer to the procedure report that was given to you for any specific questions about what was found during the examination.  If the procedure report does not answer your questions, please call your gastroenterologist to clarify.  If you requested that your care partner not be given the details of your procedure findings, then the procedure report has been included in a sealed envelope for you to review at your convenience later.  YOU SHOULD EXPECT: Some feelings of bloating in the abdomen. Passage of more gas than usual.  Walking can help get rid of the air that was put into your GI tract during the procedure and reduce the bloating. If you had a lower endoscopy (such as a colonoscopy or flexible sigmoidoscopy) you may notice spotting of blood in your stool or on the toilet paper. If you underwent a bowel prep for your procedure, you may not have a normal bowel movement for a few days.  Please Note:  You might notice some irritation and congestion in your nose or some drainage.  This is from the oxygen used during your procedure.  There is no need for concern and it should clear up in a day or so.  SYMPTOMS TO REPORT IMMEDIATELY:   Following lower endoscopy (colonoscopy or flexible sigmoidoscopy):  Excessive amounts of blood in the stool  Significant tenderness or worsening of abdominal pains  Swelling of the abdomen that is new, acute           a gastroenterologist can be reached at any hour by calling [phone].   DIET:  We do recommend a small meal at first, but then you may proceed to your regular diet.  Drink plenty of fluids but you should avoid alcoholic beverages for 24 hours.  ACTIVITY:  You should plan to take it easy for  the rest of today and you should NOT DRIVE or use heavy machinery until tomorrow (because of the sedation medicines used during the test).    FOLLOW UP: Our staff will call the number listed on your records the next business day following your procedure to check on you and address any questions or concerns that you may have regarding the information given to you following your procedure. If we do not reach you, we will leave a message.  However, if you are feeling well and you are not experiencing any problems, there is no need to return our call.  We will assume that you have returned to your regular daily activities without incident.  If any biopsies were taken you will be contacted by phone or by letter within the next 1-3 weeks.  Please call us at [phone] if you have not heard about the biopsies in 3 weeks.    SIGNATURES/CONFIDENTIALITY: You and/or your care partner have signed paperwork which will be entered into your electronic medical record.  These signatures attest to the fact that that the information above on your After Visit Summary has been reviewed and is understood.  Full responsibility of the confidentiality of this discharge information lies with you and/or your care-partner.

## 2015-12-11 NOTE — Progress Notes (Signed)
Report to PACU, RN, vss, BBS= Clear.  

## 2015-12-11 NOTE — Op Note (Signed)
[facility] Patient Name: Edward Hawkins Procedure Date: 12/11/2015 8:04 AM MRN: [id number] Endoscopist: Docia Chuck. Henrene Pastor , MD Age: 65 Referring MD:  Date of Birth: 04-26-1950 Gender: Male Account #: [id number] Procedure:                Colonoscopy with cold snare x 1 Indications:              High risk colon cancer surveillance: Personal                            history of multiple (3 or more) adenomas. Prior                            exams 2009 (3 polyps), 2013 (4 polyps) Medicines:                Monitored Anesthesia Care Procedure:                Pre-Anesthesia Assessment:                           - Prior to the procedure, a History and Physical                            was performed, and patient medications and                            allergies were reviewed. The patient's tolerance of                            previous anesthesia was also reviewed. The risks                            and benefits of the procedure and the sedation                            options and risks were discussed with the patient.                            All questions were answered, and informed consent                            was obtained. Prior Anticoagulants: The patient has                            taken no previous anticoagulant or antiplatelet                            agents. ASA Grade Assessment: II - A patient with                            mild systemic disease. After reviewing the risks                            and benefits, the patient was deemed in  satisfactory condition to undergo the procedure.                           After obtaining informed consent, the colonoscope                            was passed under direct vision. Throughout the                            procedure, the patient's blood pressure, pulse, and                            oxygen saturations were monitored continuously. The                            Model  CF-HQ190L [id number]) scope was introduced                            through the anus and advanced to the the cecum,                            identified by appendiceal orifice and ileocecal                            valve. The ileocecal valve, appendiceal orifice,                            and rectum were photographed. The quality of the                            bowel preparation was excellent. The colonoscopy                            was performed without difficulty. The patient                            tolerated the procedure well. The bowel preparation                            used was SUPREP. Scope In: 8:09:55 AM Scope Out: 8:25:34 AM Scope Withdrawal Time: 0 hours 13 minutes 59 seconds  Total Procedure Duration: 0 hours 15 minutes 39 seconds  Findings:                 A 2 mm polyp was found in the cecum. The polyp was                            removed with a cold snare. Resection and retrieval                            were complete.                           Multiple diverticula were found in the sigmoid  colon.                           Internal hemorrhoids were found during retroflexion.                           The exam was otherwise without abnormality on                            direct and retroflexion views. Complications:            No immediate complications. Estimated blood loss:                            None. Estimated Blood Loss:     Estimated blood loss: none. Impression:               - One 2 mm polyp in the cecum, removed with a cold                            snare. Resected and retrieved.                           - Diverticulosis in the sigmoid colon.                           - Internal hemorrhoids.                           - The examination was otherwise normal on direct                            and retroflexion views. Recommendation:           - Repeat colonoscopy in 5 years for surveillance.                            - Patient has a contact number available for                            emergencies. The signs and symptoms of potential                            delayed complications were discussed with the                            patient. Return to normal activities tomorrow.                            Written discharge instructions were provided to the                            patient.                           - Resume previous diet.                           -  Continue present medications.                           - Await pathology results. Docia Chuck. Henrene Pastor, MD 12/11/2015 8:34:18 AM This report has been signed electronically.

## 2015-12-11 NOTE — Progress Notes (Signed)
Called to room to assist during endoscopic procedure.  Patient ID and intended procedure confirmed with present staff. Received instructions for my participation in the procedure from the performing physician.  

## 2015-12-12 ENCOUNTER — Telehealth: Payer: Self-pay

## 2015-12-12 NOTE — Telephone Encounter (Signed)
  Follow up Call-  Call back number 12/11/2015  Post procedure Call Back phone  # [phone]  Permission to leave phone message Yes  Some recent data might be hidden     Patient questions:  Do you have a fever, pain , or abdominal swelling? No. Pain Score  0 *  Have you tolerated food without any problems? Yes.    Have you been able to return to your normal activities? Yes.    Do you have any questions about your discharge instructions: Diet   No. Medications  No. Follow up visit  No.  Do you have questions or concerns about your Care? No.  Actions  Pt. Denies any pain.   Pt. Back at work this morning. Physician/ provider Notified :

## 2015-12-17 ENCOUNTER — Encounter: Payer: Self-pay | Admitting: Internal Medicine

## 2015-12-24 DIAGNOSIS — Z9889 Other specified postprocedural states: Secondary | ICD-10-CM | POA: Diagnosis not present

## 2015-12-24 DIAGNOSIS — H52211 Irregular astigmatism, right eye: Secondary | ICD-10-CM | POA: Diagnosis not present

## 2015-12-24 DIAGNOSIS — Z961 Presence of intraocular lens: Secondary | ICD-10-CM | POA: Diagnosis not present

## 2016-02-23 ENCOUNTER — Other Ambulatory Visit: Payer: Self-pay | Admitting: Endocrinology

## 2016-03-10 ENCOUNTER — Other Ambulatory Visit: Payer: Self-pay | Admitting: Endocrinology

## 2016-03-30 ENCOUNTER — Encounter: Payer: Self-pay | Admitting: Endocrinology

## 2016-03-30 ENCOUNTER — Ambulatory Visit (INDEPENDENT_AMBULATORY_CARE_PROVIDER_SITE_OTHER): Payer: PPO | Admitting: Endocrinology

## 2016-03-30 DIAGNOSIS — R509 Fever, unspecified: Secondary | ICD-10-CM | POA: Diagnosis not present

## 2016-03-30 MED ORDER — OSELTAMIVIR PHOSPHATE 75 MG PO CAPS: 75.0000 mg | ORAL_CAPSULE | Freq: Two times a day (BID) | ORAL | 0 refills | Status: DC

## 2016-03-30 MED ORDER — ONDANSETRON HCL 4 MG PO TABS: 4.0000 mg | ORAL_TABLET | Freq: Four times a day (QID) | ORAL | 0 refills | Status: DC | PRN

## 2016-03-30 NOTE — Progress Notes (Signed)
Subjective:    Patient ID: Edward Hawkins, male    DOB: [birth year], 66 y.o.   MRN: [id number]  HPI Pt states 2 days of moderate myalgias throughot the body, and assoc fever (101)/chills.  He has slight sore throat and cough.   Past Medical History:  Diagnosis Date  . ASTHMA 10/08/2006  . Asthma   . Cataract    extractions  . Colon polyps    tubular adenomas  . CVA 10/10/2005  . DIABETES MELLITUS, TYPE II 10/08/2006  . Diverticulosis   . HYPERLIPIDEMIA 10/08/2006  . HYPERTENSION 10/08/2006  . RECTAL BLEEDING 09/15/2009  . Seasonal allergies     Past Surgical History:  Procedure Laterality Date  . CATARACT EXTRACTION, BILATERAL    . COLONOSCOPY    . KNEE SURGERY     left knee x2  . Stress Cardiolite  11/05/2005  . TRANSTHORACIC ECHOCARDIOGRAM  10/12/2005    Social History   Social History  . Marital status: Single    Spouse name: N/A  . Number of children: N/A  . Years of education: N/A   Occupational History  . Works at [employer]  . Smoking status: Never Smoker  . Smokeless tobacco: Never Used  . Alcohol use No  . Drug use: No  . Sexual activity: Not on file   Other Topics Concern  . Not on file   Social History Narrative  . No narrative on file    Current Outpatient Prescriptions on File Prior to Visit  Medication Sig Dispense Refill  . ADVAIR DISKUS 250-50 MCG/DOSE AEPB INHALE ONE DOSE BY MOUTH ONCE DAILY 60 each 1  . aspirin 325 MG tablet Take 325 mg by mouth daily.      Marland Kitchen atorvastatin (LIPITOR) 20 MG tablet TAKE 1 TABLET (20 MG TOTAL) BY MOUTH DAILY. 90 tablet 3  . diltiazem (CARDIZEM) 60 MG tablet TAKE ONE TABLET BY MOUTH TWICE DAILY 60 tablet 2  . fish oil-omega-3 fatty acids 1000 MG capsule Take 1 g by mouth daily.    Marland Kitchen losartan (COZAAR) 100 MG tablet Take 1 tablet (100 mg total) by mouth daily. 90 tablet 3  . Multiple Vitamin (MULTIVITAMIN) tablet [employer] MVI 50 plus-Take one daily    . pioglitazone (ACTOS) 15 MG  tablet Take 1 tablet (15 mg total) by mouth daily. 90 tablet 3  . traMADol (ULTRAM) 50 MG tablet TAKE ONE TABLET BY MOUTH EVERY 6 HOURS AS NEEDED FOR PAIN    . traZODone (DESYREL) 100 MG tablet Take 1 tablet (100 mg total) by mouth at bedtime. 30 tablet 11  . zafirlukast (ACCOLATE) 20 MG tablet TAKE ONE TABLET BY MOUTH TWICE DAILY 60 tablet 5   Current Facility-Administered Medications on File Prior to Visit  Medication Dose Route Frequency Provider Last Rate Last Dose  . 0.9 %  sodium chloride infusion  500 mL Intravenous Continuous Irene Shipper, MD        No Known Allergies  Family History  Problem Relation Age of Onset  . Cancer Brother     Lung Cancer  . Diabetes Mother   . Diabetes Father   . Cancer Daughter     Breast Cancer  . Colon cancer Neg Hx     BP (!) 148/78   Pulse 100   Temp 99.8 F (37.7 C) (Oral)   Wt 249 lb (112.9 kg)   SpO2 96%   BMI 34.73 kg/m    Review of Systems He  has nausea but no vomiting.      Objective:   Physical Exam VITAL SIGNS:  See vs page GENERAL: no distress head: no deformity  eyes: no periorbital swelling, no proptosis  external nose and ears are normal  mouth: no lesion seen LUNGS:  Clear to auscultation.       Assessment & Plan:  Flu, new Patient is advised the following: Patient Instructions  I have sent a prescription to your pharmacy (tamiflu and nausea medication). I hope you feel better soon.  If you don't feel better by next week, please call back.  Please call sooner if you get worse.

## 2016-03-30 NOTE — Patient Instructions (Signed)
I have sent a prescription to your pharmacy (tamiflu and nausea medication). I hope you feel better soon.  If you don't feel better by next week, please call back.  Please call sooner if you get worse.

## 2016-03-31 DIAGNOSIS — R509 Fever, unspecified: Secondary | ICD-10-CM | POA: Insufficient documentation

## 2016-04-01 ENCOUNTER — Other Ambulatory Visit: Payer: Self-pay | Admitting: Endocrinology

## 2016-04-01 ENCOUNTER — Telehealth: Payer: Self-pay | Admitting: Endocrinology

## 2016-04-01 MED ORDER — PROMETHAZINE HCL 25 MG PO TABS: 25.0000 mg | ORAL_TABLET | Freq: Three times a day (TID) | ORAL | 0 refills | Status: DC | PRN

## 2016-04-01 MED ORDER — PROMETHAZINE HCL 12.5 MG PO TABS: 12.5000 mg | ORAL_TABLET | Freq: Four times a day (QID) | ORAL | 0 refills | Status: DC | PRN

## 2016-04-01 NOTE — Telephone Encounter (Signed)
I contacted the patient and advised of message. Patient voiced understanding.  

## 2016-04-01 NOTE — Telephone Encounter (Signed)
See message and please advise, Thanks!  

## 2016-04-01 NOTE — Telephone Encounter (Signed)
Ins prefers promethazine.  I have sent a prescription to your pharmacy

## 2016-04-01 NOTE — Telephone Encounter (Signed)
Pharmacy stated he need a PA for medication ondansetron (ZOFRAN) 4 MG tablet. He is still having some nausea.  Please advise

## 2016-04-02 ENCOUNTER — Telehealth: Payer: Self-pay | Admitting: Endocrinology

## 2016-04-02 MED ORDER — PROMETHAZINE-DM 6.25-15 MG/5ML PO SYRP: 5.0000 mL | ORAL_SOLUTION | Freq: Four times a day (QID) | ORAL | 0 refills | Status: DC | PRN

## 2016-04-02 NOTE — Telephone Encounter (Signed)
Please verify no fever or sob I have sent a prescription to your pharmacy.   Do not take promethazine with this, as this contains promethazine.

## 2016-04-02 NOTE — Telephone Encounter (Signed)
Pt called in and said that his fever finally broke and that he is feeling a little better, however he still has a terrible, nagging cough and wants to know if Dr. Loanne Drilling can call him in something for that.  Please advise.

## 2016-04-02 NOTE — Telephone Encounter (Signed)
See message and please advise, Thanks!  

## 2016-04-02 NOTE — Telephone Encounter (Signed)
I contacted the patient and advised of message. Patient denied shortness of breath or fever. Patient voiced understanding on instructions.

## 2016-04-28 DIAGNOSIS — Z961 Presence of intraocular lens: Secondary | ICD-10-CM | POA: Diagnosis not present

## 2016-04-28 DIAGNOSIS — H52211 Irregular astigmatism, right eye: Secondary | ICD-10-CM | POA: Diagnosis not present

## 2016-04-28 DIAGNOSIS — Z9889 Other specified postprocedural states: Secondary | ICD-10-CM | POA: Diagnosis not present

## 2016-05-04 ENCOUNTER — Other Ambulatory Visit: Payer: Self-pay | Admitting: Endocrinology

## 2016-06-01 ENCOUNTER — Other Ambulatory Visit: Payer: Self-pay | Admitting: Endocrinology

## 2016-06-11 ENCOUNTER — Other Ambulatory Visit: Payer: Self-pay | Admitting: Endocrinology

## 2016-07-06 ENCOUNTER — Other Ambulatory Visit: Payer: Self-pay | Admitting: Endocrinology

## 2016-08-16 DIAGNOSIS — Z961 Presence of intraocular lens: Secondary | ICD-10-CM | POA: Diagnosis not present

## 2016-08-16 DIAGNOSIS — E119 Type 2 diabetes mellitus without complications: Secondary | ICD-10-CM | POA: Diagnosis not present

## 2016-08-26 DIAGNOSIS — M542 Cervicalgia: Secondary | ICD-10-CM | POA: Diagnosis not present

## 2016-09-03 DIAGNOSIS — M5412 Radiculopathy, cervical region: Secondary | ICD-10-CM | POA: Diagnosis not present

## 2016-09-06 ENCOUNTER — Other Ambulatory Visit: Payer: Self-pay | Admitting: Sports Medicine

## 2016-09-06 DIAGNOSIS — M542 Cervicalgia: Secondary | ICD-10-CM

## 2016-09-15 ENCOUNTER — Ambulatory Visit
Admission: RE | Admit: 2016-09-15 | Discharge: 2016-09-15 | Disposition: A | Discharge status: Home / Self Care | Payer: PPO | Source: Ambulatory Visit | Attending: Sports Medicine | Admitting: Sports Medicine

## 2016-09-15 ENCOUNTER — Telehealth: Payer: Self-pay | Admitting: Endocrinology

## 2016-09-15 DIAGNOSIS — M542 Cervicalgia: Secondary | ICD-10-CM

## 2016-09-15 NOTE — Telephone Encounter (Signed)
Patient calling back to check on status of sending H&P form for MRI back to [facility].  Patient would like a call back once form has been faxed to them.  Thank you,  -LL

## 2016-09-15 NOTE — Telephone Encounter (Signed)
Cassie from [employer] with X-Ray called to advise that an H&P form needs to be filled out due to the patient needing anesthesia for his MRI due to the severe neck pain. Please call the number provided to advise, a fax is also being sent to the office today with more details. This must be done before the 24th of August.

## 2016-09-16 NOTE — Telephone Encounter (Signed)
Called back and spoke with Cassie. I have started filling out the forms but I need Dr. Loanne Drilling to look over & see if last years physical can be used since his next physical isn't scheduled until 8/24.

## 2016-09-20 NOTE — Telephone Encounter (Signed)
Resending H&P form regarding MRI.  Ty,  -LL

## 2016-09-20 NOTE — Telephone Encounter (Signed)
Called PT & made him an OV for tomrrow at 8:00am. Also called Cassie back & left message at [facility] to call back about new forms to be faxed over.

## 2016-09-21 ENCOUNTER — Ambulatory Visit (INDEPENDENT_AMBULATORY_CARE_PROVIDER_SITE_OTHER): Payer: PPO | Admitting: Endocrinology

## 2016-09-21 ENCOUNTER — Encounter: Payer: Self-pay | Admitting: Endocrinology

## 2016-09-21 ENCOUNTER — Other Ambulatory Visit (HOSPITAL_COMMUNITY): Payer: Self-pay | Admitting: Sports Medicine

## 2016-09-21 VITALS — BP 132/70 | HR 81 | Wt 245.0 lb

## 2016-09-21 DIAGNOSIS — E119 Type 2 diabetes mellitus without complications: Secondary | ICD-10-CM

## 2016-09-21 DIAGNOSIS — M542 Cervicalgia: Secondary | ICD-10-CM

## 2016-09-21 LAB — POCT GLYCOSYLATED HEMOGLOBIN (HGB A1C): Hemoglobin A1C: 6.4

## 2016-09-21 MED ORDER — FLUTICASONE-SALMETEROL 250-50 MCG/DOSE IN AEPB: INHALATION_SPRAY | RESPIRATORY_TRACT | 1 refills | Status: DC

## 2016-09-21 NOTE — Telephone Encounter (Signed)
Patient came in on 09/21/2016 and forms were completed.

## 2016-09-21 NOTE — Progress Notes (Signed)
Subjective:    Patient ID: Edward Hawkins, male    DOB: [birth year], 66 y.o.   MRN: [id number]  HPI Pt returns for f/u of diabetes mellitus: DM type: 2 Dx'ed: 4259 Complications: none Therapy: pioglitizone DKA: never Severe hypoglycemia: never Pancreatitis: never Pancreatic imaging: never Other: he has never been on insulin.  Interval history: pt states he feels well in general. Past Medical History:  Diagnosis Date  . ASTHMA 10/08/2006  . Asthma   . Cataract    extractions  . Colon polyps    tubular adenomas  . CVA 10/10/2005  . DIABETES MELLITUS, TYPE II 10/08/2006  . Diverticulosis   . HYPERLIPIDEMIA 10/08/2006  . HYPERTENSION 10/08/2006  . RECTAL BLEEDING 09/15/2009  . Seasonal allergies     Past Surgical History:  Procedure Laterality Date  . CATARACT EXTRACTION, BILATERAL    . COLONOSCOPY    . KNEE SURGERY     left knee x2  . Stress Cardiolite  11/05/2005  . TRANSTHORACIC ECHOCARDIOGRAM  10/12/2005    Social History   Social History  . Marital status: Single    Spouse name: N/A  . Number of children: N/A  . Years of education: N/A   Occupational History  . Works at [employer]  . Smoking status: Never Smoker  . Smokeless tobacco: Never Used  . Alcohol use No  . Drug use: No  . Sexual activity: Not on file   Other Topics Concern  . Not on file   Social History Narrative  . No narrative on file    Current Outpatient Prescriptions on File Prior to Visit  Medication Sig Dispense Refill  . aspirin 325 MG tablet Take 325 mg by mouth daily.      Marland Kitchen atorvastatin (LIPITOR) 20 MG tablet TAKE ONE TABLET BY MOUTH ONCE DAILY 90 tablet 3  . diltiazem (CARDIZEM) 60 MG tablet TAKE ONE TABLET BY MOUTH TWICE DAILY 60 tablet 2  . fish oil-omega-3 fatty acids 1000 MG capsule Take 1 g by mouth daily.    Marland Kitchen losartan (COZAAR) 100 MG tablet TAKE ONE TABLET BY MOUTH ONCE DAILY 90 tablet 3  . Multiple Vitamin (MULTIVITAMIN) tablet [employer] MVI  50 plus-Take one daily    . pioglitazone (ACTOS) 15 MG tablet TAKE ONE TABLET BY MOUTH ONCE DAILY 90 tablet 3  . traMADol (ULTRAM) 50 MG tablet TAKE ONE TABLET BY MOUTH EVERY 6 HOURS AS NEEDED FOR PAIN    . zafirlukast (ACCOLATE) 20 MG tablet TAKE ONE TABLET BY MOUTH TWICE DAILY 60 tablet 5   Current Facility-Administered Medications on File Prior to Visit  Medication Dose Route Frequency Provider Last Rate Last Dose  . 0.9 %  sodium chloride infusion  500 mL Intravenous Continuous Irene Shipper, MD        No Known Allergies  Family History  Problem Relation Age of Onset  . Cancer Brother        Lung Cancer  . Diabetes Mother   . Diabetes Father   . Cancer Daughter        Breast Cancer  . Colon cancer Neg Hx     BP 132/70 (BP Location: Left Arm)   Pulse 81   Wt 245 lb (111.1 kg)   SpO2 96%   BMI 34.17 kg/m    Review of Systems Denies chest pain and sob    Objective:   Physical Exam VITAL SIGNS:  See vs page GENERAL: no distress LUNGS:  Clear to auscultation HEART:  Regular rate and rhythm without murmurs noted. Normal S1,S2.   ABDOMEN: abdomen is soft, nontender.  no hepatosplenomegaly.  not distended.  Self-reducing ventral hernia Pulses: foot pulses are intact bilaterally.   MSK: no deformity of the feet or ankles.  CV: trace bilat edema of the legs Skin:  no ulcer on the feet or ankles.  normal color and temp on the feet and ankles Neuro: sensation is intact to touch on the feet and ankles.     I personally reviewed electrocardiogram tracing (today): Indication: DM Impression: NSR.  No MI.  Low voltage Compared to 2017: no significant change  Lab Results  Component Value Date   HGBA1C 6.4 09/21/2016      Assessment & Plan:  Type 2 DM: well-controlled HTN: well-controlled. Shoulder pain.  Needs MRI. Please continue the same medications.  Cleared for sedation for MRI

## 2016-09-21 NOTE — Telephone Encounter (Signed)
Patient came in for appt and for forms to be signed.

## 2016-09-22 NOTE — Patient Instructions (Signed)
Please continue the same medications. 

## 2016-09-23 ENCOUNTER — Other Ambulatory Visit: Payer: Self-pay | Admitting: Endocrinology

## 2016-10-01 ENCOUNTER — Ambulatory Visit: Payer: PPO | Admitting: Endocrinology

## 2016-10-27 ENCOUNTER — Encounter (HOSPITAL_COMMUNITY): Payer: Self-pay | Admitting: *Deleted

## 2016-10-27 NOTE — Progress Notes (Signed)
Pt denies SOB, chest pain, and being under the care of a cardiologist. Pt denies having a cardiac cath but stated that a stress test and echo was performed in 2007. Pt denies recent labs. Pt made aware to stop taking vitamins and herbal medications. Pt made aware to not take Actos on morning of MRI. Pt satted that he does not check his blood glucose when providing instructions for diabetes protocol. Pt verbalized understanding of all pre-op instructions. Claiborne Billings, Surgical Coordinator, to fax H&P.

## 2016-10-27 NOTE — Anesthesia Preprocedure Evaluation (Addendum)
Anesthesia Evaluation  Patient identified by MRN, date of birth, ID band Patient awake    Reviewed: Allergy & Precautions, NPO status , Patient's Chart, lab work & pertinent test results  Airway Mallampati: III  TM Distance: >3 FB Neck ROM: Full    Dental no notable dental hx. (+) Teeth Intact   Pulmonary asthma ,    Pulmonary exam normal breath sounds clear to auscultation       Cardiovascular hypertension, Pt. on medications + Peripheral Vascular Disease  Normal cardiovascular exam Rhythm:Regular Rate:Normal     Neuro/Psych CVA negative psych ROS   GI/Hepatic negative GI ROS, Neg liver ROS,   Endo/Other  diabetes, Well Controlled, Type 2, Oral Hypoglycemic AgentsHyperlipidemia Obesity  Renal/GU negative Renal ROS  negative genitourinary   Musculoskeletal Cervical pain   Abdominal   Peds  Hematology negative hematology ROS (+)   Anesthesia Other Findings   Reproductive/Obstetrics                            Anesthesia Physical Anesthesia Plan  ASA: III  Anesthesia Plan: General   Post-op Pain Management:    Induction:   PONV Risk Score and Plan: 2 and Ondansetron, Propofol infusion and Midazolam  Airway Management Planned: LMA  Additional Equipment:   Intra-op Plan:   Post-operative Plan: Extubation in OR  Informed Consent: I have reviewed the patients History and Physical, chart, labs and discussed the procedure including the risks, benefits and alternatives for the proposed anesthesia with the patient or authorized representative who has indicated his/her understanding and acceptance.   Dental advisory given  Plan Discussed with: CRNA, Anesthesiologist and Surgeon  Anesthesia Plan Comments:        Anesthesia Quick Evaluation

## 2016-10-28 ENCOUNTER — Ambulatory Visit (HOSPITAL_COMMUNITY)
Admission: RE | Admit: 2016-10-28 | Discharge: 2016-10-28 | Disposition: A | Discharge status: Home / Self Care | Payer: PPO | Source: Ambulatory Visit | Attending: Sports Medicine | Admitting: Sports Medicine

## 2016-10-28 ENCOUNTER — Encounter (HOSPITAL_COMMUNITY)
Admission: RE | Disposition: A | Discharge status: Home / Self Care | Payer: Self-pay | Source: Ambulatory Visit | Attending: Sports Medicine

## 2016-10-28 ENCOUNTER — Telehealth: Payer: Self-pay | Admitting: Endocrinology

## 2016-10-28 ENCOUNTER — Encounter (HOSPITAL_COMMUNITY): Payer: Self-pay

## 2016-10-28 ENCOUNTER — Ambulatory Visit (HOSPITAL_COMMUNITY): Payer: PPO | Admitting: Certified Registered Nurse Anesthetist

## 2016-10-28 DIAGNOSIS — E119 Type 2 diabetes mellitus without complications: Secondary | ICD-10-CM | POA: Diagnosis not present

## 2016-10-28 DIAGNOSIS — M4802 Spinal stenosis, cervical region: Secondary | ICD-10-CM | POA: Diagnosis not present

## 2016-10-28 DIAGNOSIS — Z79891 Long term (current) use of opiate analgesic: Secondary | ICD-10-CM | POA: Diagnosis not present

## 2016-10-28 DIAGNOSIS — I739 Peripheral vascular disease, unspecified: Secondary | ICD-10-CM | POA: Insufficient documentation

## 2016-10-28 DIAGNOSIS — I10 Essential (primary) hypertension: Secondary | ICD-10-CM | POA: Insufficient documentation

## 2016-10-28 DIAGNOSIS — M50221 Other cervical disc displacement at C4-C5 level: Secondary | ICD-10-CM | POA: Diagnosis not present

## 2016-10-28 DIAGNOSIS — Z7982 Long term (current) use of aspirin: Secondary | ICD-10-CM | POA: Insufficient documentation

## 2016-10-28 DIAGNOSIS — Z79899 Other long term (current) drug therapy: Secondary | ICD-10-CM | POA: Diagnosis not present

## 2016-10-28 DIAGNOSIS — M5022 Other cervical disc displacement, mid-cervical region, unspecified level: Secondary | ICD-10-CM | POA: Diagnosis not present

## 2016-10-28 DIAGNOSIS — M25512 Pain in left shoulder: Secondary | ICD-10-CM | POA: Diagnosis not present

## 2016-10-28 DIAGNOSIS — I639 Cerebral infarction, unspecified: Secondary | ICD-10-CM | POA: Diagnosis not present

## 2016-10-28 DIAGNOSIS — M542 Cervicalgia: Secondary | ICD-10-CM

## 2016-10-28 DIAGNOSIS — E669 Obesity, unspecified: Secondary | ICD-10-CM | POA: Diagnosis not present

## 2016-10-28 DIAGNOSIS — Z7984 Long term (current) use of oral hypoglycemic drugs: Secondary | ICD-10-CM | POA: Insufficient documentation

## 2016-10-28 DIAGNOSIS — J45909 Unspecified asthma, uncomplicated: Secondary | ICD-10-CM | POA: Diagnosis not present

## 2016-10-28 HISTORY — PX: RADIOLOGY WITH ANESTHESIA: SHX6223

## 2016-10-28 LAB — BASIC METABOLIC PANEL
Anion gap: 8 (ref 5–15)
BUN: 13 mg/dL (ref 6–20)
CHLORIDE: 109 mmol/L (ref 101–111)
CO2: 21 mmol/L — ABNORMAL LOW (ref 22–32)
CREATININE: 0.9 mg/dL (ref 0.61–1.24)
Calcium: 8.4 mg/dL — ABNORMAL LOW (ref 8.9–10.3)
GFR calc Af Amer: >60 mL/min (ref >60)
GFR calc non Af Amer: >60 mL/min (ref >60)
Glucose, Bld: 105 mg/dL — ABNORMAL HIGH (ref 65–99)
Potassium: 4 mmol/L (ref 3.5–5.1)
SODIUM: 138 mmol/L (ref 135–145)

## 2016-10-28 LAB — CBC
HCT: 37.5 % — ABNORMAL LOW (ref 39.0–52.0)
HEMOGLOBIN: 12.6 g/dL — AB (ref 13.0–17.0)
MCH: 32.1 pg (ref 26.0–34.0)
MCHC: 33.6 g/dL (ref 30.0–36.0)
MCV: 95.4 fL (ref 78.0–100.0)
PLATELETS: 263 10*3/uL (ref 150–400)
RBC: 3.93 MIL/uL — ABNORMAL LOW (ref 4.22–5.81)
RDW: 14.3 % (ref 11.5–15.5)
WBC: 6.7 10*3/uL (ref 4.0–10.5)

## 2016-10-28 LAB — HEMOGLOBIN A1C
HEMOGLOBIN A1C: 6.4 % — AB (ref 4.8–5.6)
Mean Plasma Glucose: 136.98 mg/dL

## 2016-10-28 LAB — GLUCOSE, CAPILLARY: Glucose-Capillary: 118 mg/dL — ABNORMAL HIGH (ref 65–99)

## 2016-10-28 SURGERY — RADIOLOGY WITH ANESTHESIA
Anesthesia: General

## 2016-10-28 MED ORDER — FENTANYL CITRATE (PF) 100 MCG/2ML IJ SOLN
INTRAMUSCULAR | Status: DC | PRN
  Administered 2016-10-28: 25 ug via INTRAVENOUS

## 2016-10-28 MED ORDER — MIDAZOLAM HCL 2 MG/2ML IJ SOLN
INTRAMUSCULAR | Status: DC | PRN
  Administered 2016-10-28: 2 mg via INTRAVENOUS

## 2016-10-28 NOTE — Telephone Encounter (Signed)
-----   Message from Rikki Spearing, RN sent at 10/27/2016  3:04 PM EDT ----- Regarding: Updated H&P needed for MRI Dr. Loanne Drilling,  Due to the length of time it has taken to get the MRI scheduled, the progress note dated 09/21/2016 is older than 30 days. Accreditation requires [place] to have an H&P less than 57 days old. Would you be able to provide [place] with an update? Thank you

## 2016-10-28 NOTE — Telephone Encounter (Signed)
Patient has appointment for Monday and had MRI done this morning.

## 2016-10-28 NOTE — Telephone Encounter (Signed)
please call patient: Ov is needed for this

## 2016-10-28 NOTE — Transfer of Care (Signed)
Immediate Anesthesia Transfer of Care Note  Patient: Edward Hawkins  Procedure(s) Performed: Procedure(s): MRI - CERVICAL SPINE WITHOUT (N/A)  Patient Location: PACU  Anesthesia Type:General  Level of Consciousness: awake, alert , oriented and patient cooperative  Airway & Oxygen Therapy: Patient Spontanous Breathing and Patient connected to nasal cannula oxygen  Post-op Assessment: Report given to RN, Post -op Vital signs reviewed and stable and Patient moving all extremities X 4  Post vital signs: Reviewed and stable  Last Vitals:  Vitals:   10/28/16 0554  BP: (!) 158/88  Pulse: 85  Resp: 18  Temp: 36.8 C  SpO2: 98%    Last Pain: There were no vitals filed for this visit.    Patients Stated Pain Goal: 4 (12/29/60 4469)  Complications: No apparent anesthesia complications

## 2016-10-28 NOTE — Anesthesia Postprocedure Evaluation (Signed)
Anesthesia Post Note  Patient: Edward Hawkins  Procedure(s) Performed: Procedure(s) (LRB): MRI - CERVICAL SPINE WITHOUT (N/A)     Patient location during evaluation: PACU Anesthesia Type: General Level of consciousness: awake and alert and oriented Pain management: pain level controlled Vital Signs Assessment: post-procedure vital signs reviewed and stable Respiratory status: spontaneous breathing, nonlabored ventilation and respiratory function stable Cardiovascular status: blood pressure returned to baseline and stable Postop Assessment: no apparent nausea or vomiting Anesthetic complications: no    Last Vitals:  Vitals:   10/28/16 0554  BP: (!) 158/88  Pulse: 85  Resp: 18  Temp: 36.8 C  SpO2: 98%    Last Pain: There were no vitals filed for this visit.               Brewer Hitchman A.

## 2016-10-28 NOTE — H&P (Signed)
Anesthesia H&P Update: History and Physical Exam reviewed; patient is OK for planned anesthetic and procedure. ? ?

## 2016-10-29 ENCOUNTER — Encounter (HOSPITAL_COMMUNITY): Payer: Self-pay | Admitting: Radiology

## 2016-11-01 ENCOUNTER — Encounter: Payer: Self-pay | Admitting: Endocrinology

## 2016-11-01 ENCOUNTER — Ambulatory Visit (INDEPENDENT_AMBULATORY_CARE_PROVIDER_SITE_OTHER): Payer: PPO | Admitting: Endocrinology

## 2016-11-01 VITALS — BP 136/72 | HR 91 | Wt 246.2 lb

## 2016-11-01 DIAGNOSIS — D649 Anemia, unspecified: Secondary | ICD-10-CM

## 2016-11-01 DIAGNOSIS — Z23 Encounter for immunization: Secondary | ICD-10-CM | POA: Diagnosis not present

## 2016-11-01 DIAGNOSIS — D539 Nutritional anemia, unspecified: Secondary | ICD-10-CM | POA: Insufficient documentation

## 2016-11-01 DIAGNOSIS — Z125 Encounter for screening for malignant neoplasm of prostate: Secondary | ICD-10-CM

## 2016-11-01 DIAGNOSIS — E119 Type 2 diabetes mellitus without complications: Secondary | ICD-10-CM

## 2016-11-01 LAB — HEPATIC FUNCTION PANEL
ALBUMIN: 4.1 g/dL (ref 3.5–5.2)
ALT: 15 U/L (ref 0–53)
AST: 16 U/L (ref 0–37)
Alkaline Phosphatase: 76 U/L (ref 39–117)
Bilirubin, Direct: 0.1 mg/dL (ref 0.0–0.3)
Total Bilirubin: 0.3 mg/dL (ref 0.2–1.2)
Total Protein: 6.8 g/dL (ref 6.0–8.3)

## 2016-11-01 LAB — CBC WITH DIFFERENTIAL/PLATELET
Basophils Absolute: 0 10*3/uL (ref 0.0–0.1)
Basophils Relative: 0.6 % (ref 0.0–3.0)
EOS PCT: 2.1 % (ref 0.0–5.0)
Eosinophils Absolute: 0.2 10*3/uL (ref 0.0–0.7)
HCT: 40.3 % (ref 39.0–52.0)
Hemoglobin: 13.3 g/dL (ref 13.0–17.0)
LYMPHS ABS: 1.8 10*3/uL (ref 0.7–4.0)
Lymphocytes Relative: 22.8 % (ref 12.0–46.0)
MCHC: 33 g/dL (ref 30.0–36.0)
MCV: 99.5 fl (ref 78.0–100.0)
MONO ABS: 0.9 10*3/uL (ref 0.1–1.0)
Monocytes Relative: 11.2 % (ref 3.0–12.0)
Neutro Abs: 4.9 10*3/uL (ref 1.4–7.7)
Neutrophils Relative %: 63.3 % (ref 43.0–77.0)
Platelets: 338 10*3/uL (ref 150.0–400.0)
RBC: 4.05 Mil/uL — AB (ref 4.22–5.81)
RDW: 14.4 % (ref 11.5–15.5)
WBC: 7.7 10*3/uL (ref 4.0–10.5)

## 2016-11-01 LAB — BASIC METABOLIC PANEL
BUN: 19 mg/dL (ref 6–23)
CHLORIDE: 108 meq/L (ref 96–112)
CO2: 26 meq/L (ref 19–32)
CREATININE: 0.99 mg/dL (ref 0.40–1.50)
Calcium: 9.2 mg/dL (ref 8.4–10.5)
GFR: 80.21 mL/min (ref >60.00)
Glucose, Bld: 113 mg/dL — ABNORMAL HIGH (ref 70–99)
Potassium: 4.6 mEq/L (ref 3.5–5.1)
Sodium: 144 mEq/L (ref 135–145)

## 2016-11-01 LAB — LIPID PANEL
CHOL/HDL RATIO: 4
Cholesterol: 150 mg/dL (ref 0–200)
HDL: 40.5 mg/dL (ref >39.00)
LDL CALC: 90 mg/dL (ref 0–99)
NONHDL: 109.27
TRIGLYCERIDES: 94 mg/dL (ref 0.0–149.0)
VLDL: 18.8 mg/dL (ref 0.0–40.0)

## 2016-11-01 LAB — MICROALBUMIN / CREATININE URINE RATIO
CREATININE, U: 329.4 mg/dL
Microalb Creat Ratio: 0.4 mg/g (ref 0.0–30.0)
Microalb, Ur: 1.2 mg/dL (ref 0.0–1.9)

## 2016-11-01 LAB — URINALYSIS, ROUTINE W REFLEX MICROSCOPIC
Bilirubin Urine: NEGATIVE
Hgb urine dipstick: NEGATIVE
KETONES UR: NEGATIVE
Leukocytes, UA: NEGATIVE
NITRITE: NEGATIVE
PH: 5.5 (ref 5.0–8.0)
RBC / HPF: NONE SEEN (ref 0–?)
Specific Gravity, Urine: >=1.03 — AB (ref 1.000–1.030)
TOTAL PROTEIN, URINE-UPE24: NEGATIVE
Urine Glucose: NEGATIVE
Urobilinogen, UA: 0.2 (ref 0.0–1.0)

## 2016-11-01 LAB — PSA: PSA: 0.77 ng/mL (ref 0.10–4.00)

## 2016-11-01 LAB — TSH: TSH: 1.21 u[IU]/mL (ref 0.35–4.50)

## 2016-11-01 LAB — IBC PANEL
IRON: 65 ug/dL (ref 42–165)
SATURATION RATIOS: 16 % — AB (ref 20.0–50.0)
Transferrin: 290 mg/dL (ref 212.0–360.0)

## 2016-11-01 LAB — VITAMIN D 25 HYDROXY (VIT D DEFICIENCY, FRACTURES): VITD: 25.81 ng/mL — AB (ref 30.00–100.00)

## 2016-11-01 MED FILL — Midazolam HCl Inj 2 MG/2ML (Base Equivalent): INTRAMUSCULAR | Qty: 2 | Status: AC

## 2016-11-01 MED FILL — Ondansetron HCl Inj 4 MG/2ML (2 MG/ML): INTRAMUSCULAR | Qty: 2 | Status: AC

## 2016-11-01 MED FILL — Propofol IV Emul 200 MG/20ML (10 MG/ML): INTRAVENOUS | Qty: 20 | Status: AC

## 2016-11-01 MED FILL — Lidocaine HCl IV Inj 20 MG/ML: INTRAVENOUS | Qty: 5 | Status: AC

## 2016-11-01 MED FILL — Phenylephrine-NaCl IV Solution 10 MG/250ML-0.9%: INTRAVENOUS | Qty: 250 | Status: AC

## 2016-11-01 MED FILL — Fentanyl Citrate Preservative Free (PF) Inj 100 MCG/2ML: INTRAMUSCULAR | Qty: 2 | Status: AC

## 2016-11-01 NOTE — Progress Notes (Signed)
Subjective:    Patient ID: Edward Hawkins, male    DOB: [birth year], 66 y.o.   MRN: [id number]  HPI Pt is here for regular wellness examination, and is feeling pretty well in general, and says chronic med probs are stable, except as noted below Past Medical History:  Diagnosis Date  . ASTHMA 10/08/2006  . Asthma   . Cataract    extractions  . Colon polyps    tubular adenomas  . CVA 10/10/2005  . DIABETES MELLITUS, TYPE II 10/08/2006  . Diverticulosis   . HYPERLIPIDEMIA 10/08/2006  . HYPERTENSION 10/08/2006  . RECTAL BLEEDING 09/15/2009  . Seasonal allergies     Past Surgical History:  Procedure Laterality Date  . CATARACT EXTRACTION, BILATERAL    . COLONOSCOPY    . KNEE SURGERY     left knee x2  . RADIOLOGY WITH ANESTHESIA N/A 10/28/2016   Procedure: MRI - CERVICAL SPINE WITHOUT;  Surgeon: Radiologist, Medication, MD;  Location: [facility];  Service: Radiology;  Laterality: N/A;  . Stress Cardiolite  11/05/2005  . TRANSTHORACIC ECHOCARDIOGRAM  10/12/2005    Social History   Social History  . Marital status: Single    Spouse name: N/A  . Number of children: N/A  . Years of education: N/A   Occupational History  . Works at [employer]  . Smoking status: Never Smoker  . Smokeless tobacco: Never Used  . Alcohol use No  . Drug use: No  . Sexual activity: Not on file   Other Topics Concern  . Not on file   Social History Narrative  . No narrative on file    Current Outpatient Prescriptions on File Prior to Visit  Medication Sig Dispense Refill  . aspirin 325 MG tablet Take 325 mg by mouth daily.      Marland Kitchen atorvastatin (LIPITOR) 20 MG tablet TAKE ONE TABLET BY MOUTH ONCE DAILY 90 tablet 3  . diltiazem (CARDIZEM) 60 MG tablet TAKE 1 TABLET BY MOUTH TWICE DAILY 60 tablet 2  . fish oil-omega-3 fatty acids 1000 MG capsule Take 1 g by mouth daily.    . Fluticasone-Salmeterol (ADVAIR DISKUS) 250-50 MCG/DOSE AEPB INHALE ONE DOSE BY MOUTH ONCE DAILY 60  each 1  . losartan (COZAAR) 100 MG tablet TAKE ONE TABLET BY MOUTH ONCE DAILY 90 tablet 3  . Multiple Vitamin (MULTIVITAMIN) tablet Take 1 tablet by mouth daily.     . Omega-3 Fatty Acids (FISH OIL PO) Take 1 capsule by mouth daily.    Marland Kitchen oxyCODONE-acetaminophen (PERCOCET) 10-325 MG tablet Take 1 tablet by mouth every 6 (six) hours as needed for pain.     . pioglitazone (ACTOS) 15 MG tablet TAKE ONE TABLET BY MOUTH ONCE DAILY 90 tablet 3  . zafirlukast (ACCOLATE) 20 MG tablet TAKE ONE TABLET BY MOUTH TWICE DAILY (Patient taking differently: TAKE ONE TABLET BY MOUTH ONCE A DAY) 60 tablet 5   No current facility-administered medications on file prior to visit.     No Known Allergies  Family History  Problem Relation Age of Onset  . Cancer Brother        Lung Cancer  . Diabetes Mother   . Diabetes Father   . Cancer Daughter        Breast Cancer  . Colon cancer Neg Hx     BP 136/72   Pulse 91   Wt 246 lb 3.2 oz (111.7 kg)   SpO2 96%   BMI 34.34 kg/m  Review of Systems Denies fever, fatigue, visual loss, hearing loss, chest pain, sob, back pain, depression, cold intolerance, BRBPR, hematuria, syncope, numbness, allergy sxs, easy bruising, and rash.       Objective:   Physical Exam VS: see vs page GEN: no distress HEAD: head: no deformity eyes: no periorbital swelling, no proptosis external nose and ears are normal mouth: no lesion seen NECK: supple, thyroid is not enlarged CHEST WALL: no deformity LUNGS: clear to auscultation BREASTS:  No gynecomastia CV: reg rate and rhythm, no murmur ABD: abdomen is soft, nontender.  no hepatosplenomegaly.  not distended.  no hernia.  RECTAL: normal external and internal exam.  heme neg. PROSTATE:  Normal size.  No nodule MUSCULOSKELETAL: muscle bulk and strength are grossly normal.  no obvious joint swelling.  gait is normal and steady EXTEMITIES: no deformity.  no ulcer on the feet.  feet are of normal color and temp.  no edema.   PULSES: dorsalis pedis intact bilat.  no carotid bruit NEURO:  cn 2-12 grossly intact.   readily moves all 4's.  sensation is intact to touch on the feet SKIN:  Normal texture and temperature.  No rash or suspicious lesion is visible.   NODES:  None palpable at the neck PSYCH: alert, well-oriented.  Does not appear anxious nor depressed.      Assessment & Plan:  Wellness visit today, with problems stable, except as noted.  Patient Instructions  Please consider these measures for your health:  minimize alcohol.  Do not use tobacco products.  Have a colonoscopy at least every 10 years from age 60.  Keep firearms safely stored.  Always use seat belts.  have working smoke alarms in your home.  See an eye doctor and dentist regularly.  Never drive under the influence of alcohol or drugs (including prescription drugs).  Those with fair skin should take precautions against the sun, and should carefully examine their skin once per month, for any new or changed moles. blood tests are requested for you today.  We'll let you know about the results.  Please come back for a follow-up appointment in 6 months.

## 2016-11-01 NOTE — Patient Instructions (Signed)
Please consider these measures for your health:  minimize alcohol.  Do not use tobacco products.  Have a colonoscopy at least every 10 years from age 66. Keep firearms safely stored.  Always use seat belts.  have working smoke alarms in your home.  See an eye doctor and dentist regularly.  Never drive under the influence of alcohol or drugs (including prescription drugs).  Those with fair skin should take precautions against the sun, and should carefully examine their skin once per month, for any new or changed moles. blood tests are requested for you today.  We'll let you know about the results.  Please come back for a follow-up appointment in 6 months.    

## 2016-11-02 DIAGNOSIS — M5412 Radiculopathy, cervical region: Secondary | ICD-10-CM | POA: Diagnosis not present

## 2016-11-02 LAB — PTH, INTACT AND CALCIUM
Calcium: 9.1 mg/dL (ref 8.6–10.3)
PTH: 34 pg/mL (ref 14–64)

## 2016-11-17 DIAGNOSIS — M1712 Unilateral primary osteoarthritis, left knee: Secondary | ICD-10-CM | POA: Diagnosis not present

## 2016-11-23 DIAGNOSIS — M25562 Pain in left knee: Secondary | ICD-10-CM | POA: Diagnosis not present

## 2016-11-25 DIAGNOSIS — M1712 Unilateral primary osteoarthritis, left knee: Secondary | ICD-10-CM | POA: Diagnosis not present

## 2016-11-26 ENCOUNTER — Other Ambulatory Visit: Payer: Self-pay | Admitting: Endocrinology

## 2016-12-01 DIAGNOSIS — M1712 Unilateral primary osteoarthritis, left knee: Secondary | ICD-10-CM | POA: Diagnosis not present

## 2016-12-08 ENCOUNTER — Telehealth: Payer: Self-pay | Admitting: Endocrinology

## 2016-12-09 ENCOUNTER — Ambulatory Visit (INDEPENDENT_AMBULATORY_CARE_PROVIDER_SITE_OTHER): Payer: PPO | Admitting: Endocrinology

## 2016-12-09 ENCOUNTER — Encounter: Payer: Self-pay | Admitting: Endocrinology

## 2016-12-09 DIAGNOSIS — M545 Low back pain, unspecified: Secondary | ICD-10-CM

## 2016-12-09 MED ORDER — CYCLOBENZAPRINE HCL 10 MG PO TABS: 10.0000 mg | ORAL_TABLET | Freq: Three times a day (TID) | ORAL | 2 refills | Status: DC | PRN

## 2016-12-09 NOTE — Patient Instructions (Addendum)
I have sent a prescription to your pharmacy, for a muscle relaxer. Use caution with this and tramadol, as both can cause drowsiness.       Back Pain, Adult Back pain is very common in adults.The cause of back pain is rarely dangerous and the pain often gets better over time.The cause of your back pain may not be known. Some common causes of back pain include:  Strain of the muscles or ligaments supporting the spine.  Wear and tear (degeneration) of the spinal disks.  Arthritis.  Direct injury to the back.  For many people, back pain may return. Since back pain is rarely dangerous, most people can learn to manage this condition on their own. Follow these instructions at home: Watch your back pain for any changes. The following actions may help to lessen any discomfort you are feeling:  Remain active. It is stressful on your back to sit or stand in one place for long periods of time. Do not sit, drive, or stand in one place for more than 30 minutes at a time. Take short walks on even surfaces as soon as you are able.Try to increase the length of time you walk each day.  Exercise regularly as directed by your health care provider. Exercise helps your back heal faster. It also helps avoid future injury by keeping your muscles strong and flexible.  Do not stay in bed.Resting more than 1-2 days can delay your recovery.  Pay attention to your body when you bend and lift. The most comfortable positions are those that put less stress on your recovering back. Always use proper lifting techniques, including: ? Bending your knees. ? Keeping the load close to your body. ? Avoiding twisting.  Find a comfortable position to sleep. Use a firm mattress and lie on your side with your knees slightly bent. If you lie on your back, put a pillow under your knees.  Avoid feeling anxious or stressed.Stress increases muscle tension and can worsen back pain.It is important to recognize when you are  anxious or stressed and learn ways to manage it, such as with exercise.  Take medicines only as directed by your health care provider. Over-the-counter medicines to reduce pain and inflammation are often the most helpful.Your health care provider may prescribe muscle relaxant drugs.These medicines help dull your pain so you can more quickly return to your normal activities and healthy exercise.  Apply ice to the injured area: ? Put ice in a plastic bag. ? Place a towel between your skin and the bag. ? Leave the ice on for 20 minutes, 2-3 times a day for the first 2-3 days. After that, ice and heat may be alternated to reduce pain and spasms.  Maintain a healthy weight. Excess weight puts extra stress on your back and makes it difficult to maintain good posture.  Contact a health care provider if:  You have pain that is not relieved with rest or medicine.  You have increasing pain going down into the legs or buttocks.  You have pain that does not improve in one week.  You have night pain.  You lose weight.  You have a fever or chills. Get help right away if:  You develop new bowel or bladder control problems.  You have unusual weakness or numbness in your arms or legs.  You develop nausea or vomiting.  You develop abdominal pain.  You feel faint. This information is not intended to replace advice given to you by your health  care provider. Make sure you discuss any questions you have with your health care provider. Document Released: 01/25/2005 Document Revised: 06/05/2015 Document Reviewed: 05/29/2013 Elsevier Interactive Patient Education  2017 [employer].

## 2016-12-09 NOTE — Telephone Encounter (Signed)
Patient called-prescription (muscle relaxer) was sent to the wrong pharmacy-Pharmacy is [facility] on [address]. Please call patient when he can pick up his prescription.

## 2016-12-09 NOTE — Progress Notes (Signed)
Subjective:    Patient ID: Edward [facility], male    DOB: July 15, 1950, 66 y.o.   MRN: [id number]  HPI Pt states few days of severe pain at the lower back, but no assoc numbness.  He is unable to cite precip factor such as injury.   He takes tramadol for left knee pain (PKR soon).  Past Medical History:  Diagnosis Date  . ASTHMA 10/08/2006  . Asthma   . Cataract    extractions  . Colon polyps    tubular adenomas  . CVA 10/10/2005  . DIABETES MELLITUS, TYPE II 10/08/2006  . Diverticulosis   . HYPERLIPIDEMIA 10/08/2006  . HYPERTENSION 10/08/2006  . RECTAL BLEEDING 09/15/2009  . Seasonal allergies     Past Surgical History:  Procedure Laterality Date  . CATARACT EXTRACTION, BILATERAL    . COLONOSCOPY    . KNEE SURGERY     left knee x2  . RADIOLOGY WITH ANESTHESIA N/A 10/28/2016   Procedure: MRI - CERVICAL SPINE WITHOUT;  Surgeon: Radiologist, Medication, MD;  Location: [facility];  Service: Radiology;  Laterality: N/A;  . Stress Cardiolite  11/05/2005  . TRANSTHORACIC ECHOCARDIOGRAM  10/12/2005    Social History   Social History  . Marital status: Single    Spouse name: N/A  . Number of children: N/A  . Years of education: N/A   Occupational History  . Works at [employer]  . Smoking status: Never Smoker  . Smokeless tobacco: Never Used  . Alcohol use No  . Drug use: No  . Sexual activity: Not on file   Other Topics Concern  . Not on file   Social History Narrative  . No narrative on file    Current Outpatient Prescriptions on File Prior to Visit  Medication Sig Dispense Refill  . ADVAIR DISKUS 250-50 MCG/DOSE AEPB INHALE 1 PUFF BY MOUTH ONCE DAILY 60 each 1  . aspirin 325 MG tablet Take 325 mg by mouth daily.      Marland Kitchen atorvastatin (LIPITOR) 20 MG tablet TAKE ONE TABLET BY MOUTH ONCE DAILY 90 tablet 3  . diltiazem (CARDIZEM) 60 MG tablet TAKE 1 TABLET BY MOUTH TWICE DAILY 60 tablet 2  . fish oil-omega-3 fatty acids 1000 MG capsule Take 1 g  by mouth daily.    Marland Kitchen losartan (COZAAR) 100 MG tablet TAKE ONE TABLET BY MOUTH ONCE DAILY 90 tablet 3  . Multiple Vitamin (MULTIVITAMIN) tablet Take 1 tablet by mouth daily.     . Omega-3 Fatty Acids (FISH OIL PO) Take 1 capsule by mouth daily.    . pioglitazone (ACTOS) 15 MG tablet TAKE ONE TABLET BY MOUTH ONCE DAILY 90 tablet 3  . zafirlukast (ACCOLATE) 20 MG tablet TAKE ONE TABLET BY MOUTH TWICE DAILY 60 tablet 5   No current facility-administered medications on file prior to visit.     No Known Allergies  Family History  Problem Relation Age of Onset  . Cancer Brother        Lung Cancer  . Diabetes Mother   . Diabetes Father   . Cancer Daughter        Breast Cancer  . Colon cancer Neg Hx     BP (!) 148/72   Pulse 90   Wt 251 lb 9.6 oz (114.1 kg)   SpO2 98%   BMI 35.09 kg/m   Review of Systems Denies bowel or bladder retention. Denies rash.      Objective:   Physical Exam  VITAL SIGNS:  See vs page GENERAL: no distress Spine: nontender Gait: normal Neuro: sensation is intact to touch on the feet.   Lab Results  Component Value Date   HGBA1C 6.4 (H) 10/28/2016      Assessment & Plan:  DM: he should avoid steroids if possible.  Back pain, new.  Knee pain: there is s potential drug-drug interaction between tramadol and flexeril.   Patient Instructions  I have sent a prescription to your pharmacy, for a muscle relaxer. Use caution with this and tramadol, as both can cause drowsiness.       Back Pain, Adult Back pain is very common in adults.The cause of back pain is rarely dangerous and the pain often gets better over time.The cause of your back pain may not be known. Some common causes of back pain include:  Strain of the muscles or ligaments supporting the spine.  Wear and tear (degeneration) of the spinal disks.  Arthritis.  Direct injury to the back.  For many people, back pain may return. Since back pain is rarely dangerous, most people can  learn to manage this condition on their own. Follow these instructions at home: Watch your back pain for any changes. The following actions may help to lessen any discomfort you are feeling:  Remain active. It is stressful on your back to sit or stand in one place for long periods of time. Do not sit, drive, or stand in one place for more than 30 minutes at a time. Take short walks on even surfaces as soon as you are able.Try to increase the length of time you walk each day.  Exercise regularly as directed by your health care provider. Exercise helps your back heal faster. It also helps avoid future injury by keeping your muscles strong and flexible.  Do not stay in bed.Resting more than 1-2 days can delay your recovery.  Pay attention to your body when you bend and lift. The most comfortable positions are those that put less stress on your recovering back. Always use proper lifting techniques, including: ? Bending your knees. ? Keeping the load close to your body. ? Avoiding twisting.  Find a comfortable position to sleep. Use a firm mattress and lie on your side with your knees slightly bent. If you lie on your back, put a pillow under your knees.  Avoid feeling anxious or stressed.Stress increases muscle tension and can worsen back pain.It is important to recognize when you are anxious or stressed and learn ways to manage it, such as with exercise.  Take medicines only as directed by your health care provider. Over-the-counter medicines to reduce pain and inflammation are often the most helpful.Your health care provider may prescribe muscle relaxant drugs.These medicines help dull your pain so you can more quickly return to your normal activities and healthy exercise.  Apply ice to the injured area: ? Put ice in a plastic bag. ? Place a towel between your skin and the bag. ? Leave the ice on for 20 minutes, 2-3 times a day for the first 2-3 days. After that, ice and heat may be  alternated to reduce pain and spasms.  Maintain a healthy weight. Excess weight puts extra stress on your back and makes it difficult to maintain good posture.  Contact a health care provider if:  You have pain that is not relieved with rest or medicine.  You have increasing pain going down into the legs or buttocks.  You have pain that does not  improve in one week.  You have night pain.  You lose weight.  You have a fever or chills. Get help right away if:  You develop new bowel or bladder control problems.  You have unusual weakness or numbness in your arms or legs.  You develop nausea or vomiting.  You develop abdominal pain.  You feel faint. This information is not intended to replace advice given to you by your health care provider. Make sure you discuss any questions you have with your health care provider. Document Released: 01/25/2005 Document Revised: 06/05/2015 Document Reviewed: 05/29/2013 Elsevier Interactive Patient Education  2017 [employer].

## 2016-12-10 ENCOUNTER — Other Ambulatory Visit: Payer: Self-pay

## 2016-12-10 MED ORDER — CYCLOBENZAPRINE HCL 10 MG PO TABS: 10.0000 mg | ORAL_TABLET | Freq: Three times a day (TID) | ORAL | 2 refills | Status: DC | PRN

## 2016-12-10 NOTE — Telephone Encounter (Signed)
Called patient & notified him that prescription was sent to [facility].

## 2016-12-11 DIAGNOSIS — M549 Dorsalgia, unspecified: Secondary | ICD-10-CM | POA: Insufficient documentation

## 2016-12-14 ENCOUNTER — Other Ambulatory Visit: Payer: Self-pay | Admitting: Endocrinology

## 2016-12-14 MED ORDER — TIZANIDINE HCL 2 MG PO CAPS: 2.0000 mg | ORAL_CAPSULE | Freq: Three times a day (TID) | ORAL | 2 refills | Status: DC | PRN

## 2016-12-16 ENCOUNTER — Other Ambulatory Visit: Payer: Self-pay | Admitting: Orthopedic Surgery

## 2016-12-20 ENCOUNTER — Encounter (HOSPITAL_COMMUNITY)
Admission: RE | Admit: 2016-12-20 | Discharge: 2016-12-20 | Disposition: A | Discharge status: Home / Self Care | Payer: PPO | Source: Ambulatory Visit | Attending: Orthopedic Surgery | Admitting: Orthopedic Surgery

## 2016-12-20 ENCOUNTER — Encounter (HOSPITAL_COMMUNITY): Payer: Self-pay

## 2016-12-20 DIAGNOSIS — E785 Hyperlipidemia, unspecified: Secondary | ICD-10-CM | POA: Insufficient documentation

## 2016-12-20 DIAGNOSIS — J45909 Unspecified asthma, uncomplicated: Secondary | ICD-10-CM | POA: Diagnosis not present

## 2016-12-20 DIAGNOSIS — Z801 Family history of malignant neoplasm of trachea, bronchus and lung: Secondary | ICD-10-CM | POA: Diagnosis not present

## 2016-12-20 DIAGNOSIS — Z9889 Other specified postprocedural states: Secondary | ICD-10-CM | POA: Insufficient documentation

## 2016-12-20 DIAGNOSIS — Z01812 Encounter for preprocedural laboratory examination: Secondary | ICD-10-CM | POA: Insufficient documentation

## 2016-12-20 DIAGNOSIS — Z803 Family history of malignant neoplasm of breast: Secondary | ICD-10-CM | POA: Insufficient documentation

## 2016-12-20 DIAGNOSIS — Z833 Family history of diabetes mellitus: Secondary | ICD-10-CM | POA: Diagnosis not present

## 2016-12-20 DIAGNOSIS — Z79899 Other long term (current) drug therapy: Secondary | ICD-10-CM | POA: Diagnosis not present

## 2016-12-20 DIAGNOSIS — I10 Essential (primary) hypertension: Secondary | ICD-10-CM | POA: Diagnosis not present

## 2016-12-20 DIAGNOSIS — M549 Dorsalgia, unspecified: Secondary | ICD-10-CM | POA: Diagnosis not present

## 2016-12-20 DIAGNOSIS — M25562 Pain in left knee: Secondary | ICD-10-CM | POA: Insufficient documentation

## 2016-12-20 DIAGNOSIS — E119 Type 2 diabetes mellitus without complications: Secondary | ICD-10-CM | POA: Diagnosis not present

## 2016-12-20 DIAGNOSIS — Z8673 Personal history of transient ischemic attack (TIA), and cerebral infarction without residual deficits: Secondary | ICD-10-CM | POA: Insufficient documentation

## 2016-12-20 DIAGNOSIS — Z7982 Long term (current) use of aspirin: Secondary | ICD-10-CM | POA: Diagnosis not present

## 2016-12-20 HISTORY — DX: Other specified postprocedural states: Z98.890

## 2016-12-20 HISTORY — DX: Unspecified osteoarthritis, unspecified site: M19.90

## 2016-12-20 LAB — CBC
HCT: 39.4 % (ref 39.0–52.0)
Hemoglobin: 13.3 g/dL (ref 13.0–17.0)
MCH: 32.8 pg (ref 26.0–34.0)
MCHC: 33.8 g/dL (ref 30.0–36.0)
MCV: 97 fL (ref 78.0–100.0)
PLATELETS: 282 10*3/uL (ref 150–400)
RBC: 4.06 MIL/uL — AB (ref 4.22–5.81)
RDW: 13.4 % (ref 11.5–15.5)
WBC: 5.5 10*3/uL (ref 4.0–10.5)

## 2016-12-20 LAB — BASIC METABOLIC PANEL
Anion gap: 8 (ref 5–15)
BUN: 16 mg/dL (ref 6–20)
CO2: 23 mmol/L (ref 22–32)
Calcium: 8.8 mg/dL — ABNORMAL LOW (ref 8.9–10.3)
Chloride: 107 mmol/L (ref 101–111)
Creatinine, Ser: 0.97 mg/dL (ref 0.61–1.24)
Glucose, Bld: 126 mg/dL — ABNORMAL HIGH (ref 65–99)
POTASSIUM: 4 mmol/L (ref 3.5–5.1)
SODIUM: 138 mmol/L (ref 135–145)

## 2016-12-20 LAB — SURGICAL PCR SCREEN
MRSA, PCR: NEGATIVE
STAPHYLOCOCCUS AUREUS: NEGATIVE

## 2016-12-20 LAB — GLUCOSE, CAPILLARY: GLUCOSE-CAPILLARY: 129 mg/dL — AB (ref 65–99)

## 2016-12-20 NOTE — Pre-Procedure Instructions (Signed)
Edward [facility]  12/20/2016      [facility] - Edward [facility], Edward [facility]    Your procedure is scheduled on Tuesday, November 20th   Report to [facility] Admitting at 8:45 AM             (posted surgery time 10:45a -1:10p)   Call this number if you have problems the MORNING of surgery:  [phone]   Remember:              4-5 days prior to surgery, STOP TAKING any Vitamins, Herbal Supplements, Blood thinners, Anti-inflammatories.   Do not eat food or drink liquids after midnight Monday.   Take these medicines the morning of surgery with A SIP OF WATER : Diltiazem, Tizanidine, Tramadol.  Please use your Advair that morning.              You WILL NOT take any Diabetes medication the morning of surgery.   Do not wear jewelry - no rings or watches.  Do not wear lotions, colognes or deoderant.             Men may shave face and neck.   Do not bring valuables to the [facility].  [facility] is not responsible for any belongings or valuables.  Contacts, dentures or bridgework may not be worn into surgery.  Leave your suitcase in the car.  After surgery it may be brought to your room.  For patients admitted to the [facility], discharge time will be determined by your treatment team.  Please read over the following fact sheets that you were given. Pain Booklet, MRSA Information and Surgical Site Infection Prevention       [facility]- Preparing For Surgery  Before surgery, you can play an important role. Because skin is not sterile, your skin needs to be as free of germs as possible. You can reduce the number of germs on your skin by washing with CHG (chlorahexidine gluconate) Soap before surgery.  CHG  is an antiseptic cleaner which kills germs and bonds with the skin to continue killing germs even after washing.  Please do not use if you have an allergy to CHG or antibacterial soaps. If your skin becomes reddened/irritated stop using the CHG.  Do not shave (including legs and underarms) for at least 48 hours prior to first CHG shower. It is OK to shave your face.  Please follow these instructions carefully.   1. Shower the NIGHT BEFORE SURGERY and the MORNING OF SURGERY with CHG.   2. If you chose to wash your hair, wash your hair first as usual with your normal shampoo.  3. After you shampoo, rinse your hair and body thoroughly to remove the shampoo.  4. Use CHG as you would any other liquid soap. You can apply CHG directly to the skin and wash gently with a scrungie or a clean washcloth.   5. Apply the CHG Soap to your body ONLY FROM THE NECK DOWN.  Do not use on open wounds or open sores. Avoid contact with your eyes, ears, mouth and genitals (private parts). Wash Face and genitals (private parts)  with your normal soap.  6. Wash thoroughly, paying special attention to the area where your surgery will  be performed.  7. Thoroughly rinse your body with warm water from the neck down.  8. DO NOT shower/wash with your normal soap after using and rinsing off the CHG Soap.  9. Pat yourself dry with a CLEAN TOWEL.  10. Wear CLEAN PAJAMAS to bed the night before surgery, wear comfortable clothes the morning of surgery  11. Place CLEAN SHEETS on your bed the night of your first shower and DO NOT SLEEP WITH PETS.    Day of Surgery: Do not apply any deodorants/lotions. Please wear clean clothes to the [facility]/surgery center.

## 2016-12-20 NOTE — Progress Notes (Signed)
Patient states back in 2007, he had mild stroke, (TIA) with no residual issues. No reoccurence, denies murmur, cp, sob PCP is Dr. Cordelia Pen  LOV 10/2016  Dr. Loanne Drilling also manages diabetes. Patients las A1C  6.4 in Sept. 2018 He does not check his daily sugars at all.

## 2016-12-20 NOTE — Progress Notes (Signed)
   12/20/16 1107  OBSTRUCTIVE SLEEP APNEA  Have you ever been diagnosed with sleep apnea through a sleep study? No  Do you snore loudly (loud enough to be heard through closed doors)?  0  Do you often feel tired, fatigued, or sleepy during the daytime (such as falling asleep during driving or talking to someone)? 0  Has anyone observed you stop breathing during your sleep? 0  Do you have, or are you being treated for high blood pressure? 1  BMI more than 35 kg/m2? 1  Age > 50 (1-yes) 1  Neck circumference greater than:Male 16 inches or larger, Male 17inches or larger? 1  Male Gender (Yes=1) 1  Obstructive Sleep Apnea Score 5

## 2016-12-27 MED ORDER — CEFAZOLIN SODIUM-DEXTROSE 2-4 GM/100ML-% IV SOLN
2.0000 g | INTRAVENOUS | Status: AC
  Administered 2016-12-28: 2 g via INTRAVENOUS
  Filled 2016-12-27: qty 100

## 2016-12-28 ENCOUNTER — Observation Stay (HOSPITAL_COMMUNITY): Payer: PPO

## 2016-12-28 ENCOUNTER — Inpatient Hospital Stay (HOSPITAL_COMMUNITY): Payer: PPO | Admitting: Certified Registered"

## 2016-12-28 ENCOUNTER — Other Ambulatory Visit: Payer: Self-pay

## 2016-12-28 ENCOUNTER — Observation Stay (HOSPITAL_COMMUNITY)
Admission: RE | Admit: 2016-12-28 | Discharge: 2016-12-29 | Disposition: A | Discharge status: Home / Self Care | Payer: PPO | Source: Ambulatory Visit | Attending: Orthopedic Surgery | Admitting: Orthopedic Surgery

## 2016-12-28 ENCOUNTER — Encounter (HOSPITAL_COMMUNITY)
Admission: RE | Disposition: A | Discharge status: Home / Self Care | Payer: Self-pay | Source: Ambulatory Visit | Attending: Orthopedic Surgery

## 2016-12-28 ENCOUNTER — Encounter (HOSPITAL_COMMUNITY): Payer: Self-pay | Admitting: Certified Registered"

## 2016-12-28 DIAGNOSIS — E785 Hyperlipidemia, unspecified: Secondary | ICD-10-CM | POA: Insufficient documentation

## 2016-12-28 DIAGNOSIS — Z8719 Personal history of other diseases of the digestive system: Secondary | ICD-10-CM | POA: Diagnosis not present

## 2016-12-28 DIAGNOSIS — Z96652 Presence of left artificial knee joint: Secondary | ICD-10-CM

## 2016-12-28 DIAGNOSIS — Z471 Aftercare following joint replacement surgery: Secondary | ICD-10-CM | POA: Diagnosis not present

## 2016-12-28 DIAGNOSIS — Z96659 Presence of unspecified artificial knee joint: Secondary | ICD-10-CM

## 2016-12-28 DIAGNOSIS — Z7984 Long term (current) use of oral hypoglycemic drugs: Secondary | ICD-10-CM | POA: Diagnosis not present

## 2016-12-28 DIAGNOSIS — Z86010 Personal history of colonic polyps: Secondary | ICD-10-CM | POA: Diagnosis not present

## 2016-12-28 DIAGNOSIS — Z6836 Body mass index (BMI) 36.0-36.9, adult: Secondary | ICD-10-CM | POA: Insufficient documentation

## 2016-12-28 DIAGNOSIS — Z79899 Other long term (current) drug therapy: Secondary | ICD-10-CM | POA: Insufficient documentation

## 2016-12-28 DIAGNOSIS — E1151 Type 2 diabetes mellitus with diabetic peripheral angiopathy without gangrene: Secondary | ICD-10-CM | POA: Insufficient documentation

## 2016-12-28 DIAGNOSIS — I10 Essential (primary) hypertension: Secondary | ICD-10-CM | POA: Diagnosis not present

## 2016-12-28 DIAGNOSIS — E669 Obesity, unspecified: Secondary | ICD-10-CM | POA: Insufficient documentation

## 2016-12-28 DIAGNOSIS — Z7982 Long term (current) use of aspirin: Secondary | ICD-10-CM | POA: Diagnosis not present

## 2016-12-28 DIAGNOSIS — Z8673 Personal history of transient ischemic attack (TIA), and cerebral infarction without residual deficits: Secondary | ICD-10-CM | POA: Insufficient documentation

## 2016-12-28 DIAGNOSIS — J45909 Unspecified asthma, uncomplicated: Secondary | ICD-10-CM | POA: Insufficient documentation

## 2016-12-28 DIAGNOSIS — G8918 Other acute postprocedural pain: Secondary | ICD-10-CM | POA: Diagnosis not present

## 2016-12-28 DIAGNOSIS — M1712 Unilateral primary osteoarthritis, left knee: Secondary | ICD-10-CM | POA: Diagnosis not present

## 2016-12-28 HISTORY — DX: Unilateral primary osteoarthritis, left knee: M17.12

## 2016-12-28 HISTORY — PX: PARTIAL KNEE ARTHROPLASTY: SHX2174

## 2016-12-28 LAB — GLUCOSE, CAPILLARY
GLUCOSE-CAPILLARY: 114 mg/dL — AB (ref 65–99)
Glucose-Capillary: 97 mg/dL (ref 65–99)
Glucose-Capillary: 79 mg/dL (ref 65–99)

## 2016-12-28 SURGERY — ARTHROPLASTY, KNEE, UNICOMPARTMENTAL
Anesthesia: Monitor Anesthesia Care | Site: Knee | Laterality: Left

## 2016-12-28 MED ORDER — CEFAZOLIN SODIUM-DEXTROSE 2-4 GM/100ML-% IV SOLN
2.0000 g | Freq: Four times a day (QID) | INTRAVENOUS | Status: AC
  Administered 2016-12-28 (×2): 2 g via INTRAVENOUS
  Filled 2016-12-28 (×2): qty 100

## 2016-12-28 MED ORDER — POTASSIUM CHLORIDE IN NACL 20-0.45 MEQ/L-% IV SOLN
INTRAVENOUS | Status: DC
  Administered 2016-12-28: 18:00:00 via INTRAVENOUS
  Filled 2016-12-28 (×2): qty 1000

## 2016-12-28 MED ORDER — OXYCODONE HCL 5 MG PO TABS
5.0000 mg | ORAL_TABLET | ORAL | Status: DC | PRN
  Administered 2016-12-29: 5 mg via ORAL

## 2016-12-28 MED ORDER — FENTANYL CITRATE (PF) 100 MCG/2ML IJ SOLN
INTRAMUSCULAR | Status: AC
Start: 2016-12-28 — End: 2016-12-28
  Administered 2016-12-28: 100 ug via INTRAVENOUS
  Filled 2016-12-28: qty 2

## 2016-12-28 MED ORDER — PHENYLEPHRINE HCL 10 MG/ML IJ SOLN
INTRAMUSCULAR | Status: DC | PRN
  Administered 2016-12-28: 80 ug via INTRAVENOUS

## 2016-12-28 MED ORDER — BUPIVACAINE HCL (PF) 0.25 % IJ SOLN
INTRAMUSCULAR | Status: DC | PRN
  Administered 2016-12-28: 20 mL

## 2016-12-28 MED ORDER — OXYCODONE HCL 5 MG PO TABS: 5.0000 mg | ORAL_TABLET | ORAL | 0 refills | Status: DC | PRN

## 2016-12-28 MED ORDER — ACETAMINOPHEN 325 MG PO TABS
650.0000 mg | ORAL_TABLET | ORAL | Status: DC | PRN
  Administered 2016-12-28: 650 mg via ORAL
  Filled 2016-12-28: qty 2

## 2016-12-28 MED ORDER — TIZANIDINE HCL 2 MG PO TABS: 2.0000 mg | ORAL_TABLET | Freq: Three times a day (TID) | ORAL | Status: DC | PRN

## 2016-12-28 MED ORDER — ALUM & MAG HYDROXIDE-SIMETH 200-200-20 MG/5ML PO SUSP: 30.0000 mL | ORAL | Status: DC | PRN

## 2016-12-28 MED ORDER — ONDANSETRON HCL 4 MG PO TABS: 4.0000 mg | ORAL_TABLET | Freq: Four times a day (QID) | ORAL | Status: DC | PRN

## 2016-12-28 MED ORDER — POLYETHYLENE GLYCOL 3350 17 G PO PACK: 17.0000 g | PACK | Freq: Every day | ORAL | Status: DC | PRN

## 2016-12-28 MED ORDER — KETOROLAC TROMETHAMINE 15 MG/ML IJ SOLN
7.5000 mg | Freq: Four times a day (QID) | INTRAMUSCULAR | Status: DC
  Administered 2016-12-28 – 2016-12-29 (×3): 7.5 mg via INTRAVENOUS
  Filled 2016-12-28 (×3): qty 1

## 2016-12-28 MED ORDER — DIPHENHYDRAMINE HCL 12.5 MG/5ML PO ELIX: 12.5000 mg | ORAL_SOLUTION | ORAL | Status: DC | PRN

## 2016-12-28 MED ORDER — LACTATED RINGERS IV SOLN
INTRAVENOUS | Status: DC | PRN
  Administered 2016-12-28 (×2): via INTRAVENOUS

## 2016-12-28 MED ORDER — BUPIVACAINE HCL (PF) 0.25 % IJ SOLN
INTRAMUSCULAR | Status: AC
  Filled 2016-12-28: qty 30

## 2016-12-28 MED ORDER — LACTATED RINGERS IV SOLN
INTRAVENOUS | Status: DC
  Administered 2016-12-28: 09:00:00 via INTRAVENOUS

## 2016-12-28 MED ORDER — FENTANYL CITRATE (PF) 250 MCG/5ML IJ SOLN
INTRAMUSCULAR | Status: AC
  Filled 2016-12-28: qty 5

## 2016-12-28 MED ORDER — FLUTICASONE FUROATE-VILANTEROL 200-25 MCG/INH IN AEPB
1.0000 | INHALATION_SPRAY | Freq: Every day | RESPIRATORY_TRACT | Status: DC
  Administered 2016-12-29: 1 via RESPIRATORY_TRACT
  Filled 2016-12-28: qty 28

## 2016-12-28 MED ORDER — HYDROMORPHONE HCL 1 MG/ML IJ SOLN: 0.2500 mg | INTRAMUSCULAR | Status: DC | PRN

## 2016-12-28 MED ORDER — BISACODYL 10 MG RE SUPP: 10.0000 mg | Freq: Every day | RECTAL | Status: DC | PRN

## 2016-12-28 MED ORDER — ONDANSETRON HCL 4 MG/2ML IJ SOLN: 4.0000 mg | Freq: Four times a day (QID) | INTRAMUSCULAR | Status: DC | PRN

## 2016-12-28 MED ORDER — SENNA-DOCUSATE SODIUM 8.6-50 MG PO TABS: 2.0000 | ORAL_TABLET | Freq: Every day | ORAL | 1 refills | Status: DC

## 2016-12-28 MED ORDER — SODIUM CHLORIDE 0.9 % IR SOLN
Status: DC | PRN
  Administered 2016-12-28: 3000 mL

## 2016-12-28 MED ORDER — HYDROMORPHONE HCL 1 MG/ML IJ SOLN: 1.0000 mg | INTRAMUSCULAR | Status: DC | PRN

## 2016-12-28 MED ORDER — MIDAZOLAM HCL 2 MG/2ML IJ SOLN
2.0000 mg | Freq: Once | INTRAMUSCULAR | Status: AC
  Administered 2016-12-28: 2 mg via INTRAVENOUS

## 2016-12-28 MED ORDER — MAGNESIUM CITRATE PO SOLN: 1.0000 | Freq: Once | ORAL | Status: DC | PRN

## 2016-12-28 MED ORDER — METHOCARBAMOL 1000 MG/10ML IJ SOLN
500.0000 mg | Freq: Four times a day (QID) | INTRAMUSCULAR | Status: DC | PRN
  Filled 2016-12-28: qty 5

## 2016-12-28 MED ORDER — DEXAMETHASONE SODIUM PHOSPHATE 10 MG/ML IJ SOLN
10.0000 mg | Freq: Once | INTRAMUSCULAR | Status: AC
  Administered 2016-12-29: 10 mg via INTRAVENOUS
  Filled 2016-12-28: qty 1

## 2016-12-28 MED ORDER — VITAMIN D 1000 UNITS PO TABS
2000.0000 [IU] | ORAL_TABLET | Freq: Every day | ORAL | Status: DC
  Administered 2016-12-28 – 2016-12-29 (×2): 2000 [IU] via ORAL
  Filled 2016-12-28 (×2): qty 2

## 2016-12-28 MED ORDER — ACETAMINOPHEN 650 MG RE SUPP: 650.0000 mg | RECTAL | Status: DC | PRN

## 2016-12-28 MED ORDER — PROPOFOL 500 MG/50ML IV EMUL
INTRAVENOUS | Status: DC | PRN
  Administered 2016-12-28: 50 ug/kg/min via INTRAVENOUS

## 2016-12-28 MED ORDER — METHOCARBAMOL 500 MG PO TABS
500.0000 mg | ORAL_TABLET | Freq: Four times a day (QID) | ORAL | Status: DC | PRN
  Administered 2016-12-28 (×2): 500 mg via ORAL
  Filled 2016-12-28: qty 1

## 2016-12-28 MED ORDER — METOCLOPRAMIDE HCL 5 MG PO TABS: 5.0000 mg | ORAL_TABLET | Freq: Three times a day (TID) | ORAL | Status: DC | PRN

## 2016-12-28 MED ORDER — DILTIAZEM HCL 60 MG PO TABS
60.0000 mg | ORAL_TABLET | Freq: Two times a day (BID) | ORAL | Status: DC
  Administered 2016-12-28 – 2016-12-29 (×2): 60 mg via ORAL
  Filled 2016-12-28 (×2): qty 1

## 2016-12-28 MED ORDER — OXYCODONE HCL 5 MG PO TABS
10.0000 mg | ORAL_TABLET | ORAL | Status: DC | PRN
  Administered 2016-12-28 – 2016-12-29 (×5): 10 mg via ORAL
  Filled 2016-12-28 (×5): qty 2

## 2016-12-28 MED ORDER — METOCLOPRAMIDE HCL 5 MG/ML IJ SOLN: 5.0000 mg | Freq: Three times a day (TID) | INTRAMUSCULAR | Status: DC | PRN

## 2016-12-28 MED ORDER — OMEGA-3-ACID ETHYL ESTERS 1 G PO CAPS
1.0000 g | ORAL_CAPSULE | Freq: Every day | ORAL | Status: DC
  Administered 2016-12-28 – 2016-12-29 (×2): 1 g via ORAL
  Filled 2016-12-28 (×2): qty 1

## 2016-12-28 MED ORDER — ONDANSETRON HCL 4 MG PO TABS: 4.0000 mg | ORAL_TABLET | Freq: Three times a day (TID) | ORAL | 0 refills | Status: DC | PRN

## 2016-12-28 MED ORDER — BUPIVACAINE IN DEXTROSE 0.75-8.25 % IT SOLN
INTRATHECAL | Status: DC | PRN
  Administered 2016-12-28: 15 mg via INTRATHECAL

## 2016-12-28 MED ORDER — KETOROLAC TROMETHAMINE 30 MG/ML IJ SOLN
INTRAMUSCULAR | Status: DC | PRN
  Administered 2016-12-28: 30 mg

## 2016-12-28 MED ORDER — FENTANYL CITRATE (PF) 100 MCG/2ML IJ SOLN
100.0000 ug | Freq: Once | INTRAMUSCULAR | Status: AC
  Administered 2016-12-28: 100 ug via INTRAVENOUS

## 2016-12-28 MED ORDER — PROMETHAZINE HCL 25 MG/ML IJ SOLN: 6.2500 mg | INTRAMUSCULAR | Status: DC | PRN

## 2016-12-28 MED ORDER — MIDAZOLAM HCL 2 MG/2ML IJ SOLN
INTRAMUSCULAR | Status: AC
  Administered 2016-12-28: 2 mg via INTRAVENOUS
  Filled 2016-12-28: qty 2

## 2016-12-28 MED ORDER — ADULT MULTIVITAMIN W/MINERALS CH
1.0000 | ORAL_TABLET | Freq: Every day | ORAL | Status: DC
  Administered 2016-12-28 – 2016-12-29 (×2): 1 via ORAL
  Filled 2016-12-28 (×2): qty 1

## 2016-12-28 MED ORDER — ATORVASTATIN CALCIUM 20 MG PO TABS
20.0000 mg | ORAL_TABLET | Freq: Every day | ORAL | Status: DC
  Administered 2016-12-28 – 2016-12-29 (×2): 20 mg via ORAL
  Filled 2016-12-28 (×2): qty 1

## 2016-12-28 MED ORDER — PHENYLEPHRINE HCL 10 MG/ML IJ SOLN
INTRAVENOUS | Status: DC | PRN
  Administered 2016-12-28: 25 ug/min via INTRAVENOUS

## 2016-12-28 MED ORDER — MIDAZOLAM HCL 2 MG/2ML IJ SOLN
INTRAMUSCULAR | Status: AC
  Filled 2016-12-28: qty 2

## 2016-12-28 MED ORDER — DOCUSATE SODIUM 100 MG PO CAPS
100.0000 mg | ORAL_CAPSULE | Freq: Two times a day (BID) | ORAL | Status: DC
  Administered 2016-12-28 – 2016-12-29 (×2): 100 mg via ORAL
  Filled 2016-12-28 (×2): qty 1

## 2016-12-28 MED ORDER — 0.9 % SODIUM CHLORIDE (POUR BTL) OPTIME
TOPICAL | Status: DC | PRN
  Administered 2016-12-28: 1000 mL

## 2016-12-28 MED ORDER — METHOCARBAMOL 500 MG PO TABS
ORAL_TABLET | ORAL | Status: AC
  Administered 2016-12-28: 500 mg via ORAL
  Filled 2016-12-28: qty 1

## 2016-12-28 MED ORDER — PIOGLITAZONE HCL 15 MG PO TABS
15.0000 mg | ORAL_TABLET | Freq: Every day | ORAL | Status: DC
  Administered 2016-12-28 – 2016-12-29 (×2): 15 mg via ORAL
  Filled 2016-12-28 (×2): qty 1

## 2016-12-28 MED ORDER — BUPIVACAINE-EPINEPHRINE (PF) 0.5% -1:200000 IJ SOLN
INTRAMUSCULAR | Status: DC | PRN
  Administered 2016-12-28: 30 mL via PERINEURAL

## 2016-12-28 MED ORDER — RIVAROXABAN 10 MG PO TABS
10.0000 mg | ORAL_TABLET | Freq: Every day | ORAL | Status: DC
  Administered 2016-12-29: 10 mg via ORAL
  Filled 2016-12-28: qty 1

## 2016-12-28 MED ORDER — LOSARTAN POTASSIUM 50 MG PO TABS
100.0000 mg | ORAL_TABLET | Freq: Every day | ORAL | Status: DC
  Administered 2016-12-28 – 2016-12-29 (×2): 100 mg via ORAL
  Filled 2016-12-28 (×2): qty 2

## 2016-12-28 MED ORDER — CHLORHEXIDINE GLUCONATE 4 % EX LIQD: 60.0000 mL | Freq: Once | CUTANEOUS | Status: DC

## 2016-12-28 MED ORDER — MONTELUKAST SODIUM 10 MG PO TABS
10.0000 mg | ORAL_TABLET | Freq: Every day | ORAL | Status: DC
Start: 2016-12-28 — End: 2016-12-29
  Administered 2016-12-28: 10 mg via ORAL
  Filled 2016-12-28: qty 1

## 2016-12-28 MED ORDER — SENNA 8.6 MG PO TABS
1.0000 | ORAL_TABLET | Freq: Two times a day (BID) | ORAL | Status: DC
  Administered 2016-12-28 – 2016-12-29 (×2): 8.6 mg via ORAL
  Filled 2016-12-28 (×2): qty 1

## 2016-12-28 MED ORDER — PHENOL 1.4 % MT LIQD: 1.0000 | OROMUCOSAL | Status: DC | PRN

## 2016-12-28 MED ORDER — MENTHOL 3 MG MT LOZG: 1.0000 | LOZENGE | OROMUCOSAL | Status: DC | PRN

## 2016-12-28 MED ORDER — OXYCODONE HCL 5 MG PO TABS
ORAL_TABLET | ORAL | Status: AC
  Administered 2016-12-29: 5 mg via ORAL
  Filled 2016-12-28: qty 2

## 2016-12-28 SURGICAL SUPPLY — 55 items
BANDAGE ELASTIC 6 VELCRO ST LF (GAUZE/BANDAGES/DRESSINGS) ×2 IMPLANT
BANDAGE ESMARK 6X9 LF (GAUZE/BANDAGES/DRESSINGS) ×1 IMPLANT
BARD LATEX FREE URETHRAL CATHETERIZATION TRAY ×2 IMPLANT
BLADE SURG 10 STRL SS (BLADE) ×2 IMPLANT
BNDG CMPR 9X6 STRL LF SNTH (GAUZE/BANDAGES/DRESSINGS) ×1
BNDG CMPR MED 15X6 ELC VLCR LF (GAUZE/BANDAGES/DRESSINGS)
BNDG ELASTIC 6X15 VLCR STRL LF (GAUZE/BANDAGES/DRESSINGS) ×1 IMPLANT
BNDG ESMARK 6X9 LF (GAUZE/BANDAGES/DRESSINGS) ×3
BOWL SMART MIX CTS (DISPOSABLE) ×3 IMPLANT
CAP KNEE PARTIAL 2 IMPLANT
CAPT KNEE PARTIAL 2 ×3 IMPLANT
CEMENT BONE R 1X40 (Cement) ×2 IMPLANT
CLOSURE STERI-STRIP 1/2X4 (GAUZE/BANDAGES/DRESSINGS) ×2
CLSR STERI-STRIP ANTIMIC 1/2X4 (GAUZE/BANDAGES/DRESSINGS) ×3 IMPLANT
COVER SURGICAL LIGHT HANDLE (MISCELLANEOUS) ×3 IMPLANT
CUFF TOURNIQUET SINGLE 34IN LL (TOURNIQUET CUFF) ×3 IMPLANT
DRAPE EXTREMITY T 121X128X90 (DRAPE) IMPLANT
DRAPE HALF SHEET 40X57 (DRAPES) ×3 IMPLANT
DRAPE U-SHAPE 47X51 STRL (DRAPES) ×3 IMPLANT
DRSG MEPILEX BORDER 4X8 (GAUZE/BANDAGES/DRESSINGS) ×3 IMPLANT
DURAPREP 26ML APPLICATOR (WOUND CARE) ×5 IMPLANT
ELECT CAUTERY BLADE 6.4 (BLADE) ×3 IMPLANT
ELECT REM PT RETURN 9FT ADLT (ELECTROSURGICAL) ×3
ELECTRODE REM PT RTRN 9FT ADLT (ELECTROSURGICAL) ×1 IMPLANT
GLOVE BIOGEL PI ORTHO PRO SZ8 (GLOVE) ×4
GLOVE ORTHO TXT STRL SZ7.5 (GLOVE) ×3 IMPLANT
GLOVE PI ORTHO PRO STRL SZ8 (GLOVE) ×2 IMPLANT
GLOVE SURG ORTHO 8.0 STRL STRW (GLOVE) ×3 IMPLANT
GOWN STRL REUS W/ TWL XL LVL3 (GOWN DISPOSABLE) ×1 IMPLANT
GOWN STRL REUS W/TWL 2XL LVL3 (GOWN DISPOSABLE) ×3 IMPLANT
GOWN STRL REUS W/TWL XL LVL3 (GOWN DISPOSABLE) ×3
HANDPIECE INTERPULSE COAX TIP (DISPOSABLE) ×3
HOOD PEEL AWAY FACE SHEILD DIS (HOOD) ×3 IMPLANT
HOOD PEEL AWAY FLYTE STAYCOOL (MISCELLANEOUS) ×3 IMPLANT
IMMOBILIZER KNEE 22 UNIV (SOFTGOODS) ×3 IMPLANT
KIT BASIN OR (CUSTOM PROCEDURE TRAY) ×3 IMPLANT
KIT ROOM TURNOVER OR (KITS) ×3 IMPLANT
MANIFOLD NEPTUNE II (INSTRUMENTS) ×3 IMPLANT
NDL HYPO 21X1.5 SAFETY (NEEDLE) IMPLANT
NEEDLE HYPO 21X1.5 SAFETY (NEEDLE) IMPLANT
NS IRRIG 1000ML POUR BTL (IV SOLUTION) ×3 IMPLANT
PACK BLADE SAW RECIP 70 3 PT (BLADE) ×2 IMPLANT
PACK TOTAL JOINT (CUSTOM PROCEDURE TRAY) ×3 IMPLANT
PAD ARMBOARD 7.5X6 YLW CONV (MISCELLANEOUS) ×6 IMPLANT
SET HNDPC FAN SPRY TIP SCT (DISPOSABLE) ×1 IMPLANT
SUCTION FRAZIER HANDLE 10FR (MISCELLANEOUS) ×2
SUCTION TUBE FRAZIER 10FR DISP (MISCELLANEOUS) ×1 IMPLANT
SUT VIC AB 0 CT1 27 (SUTURE) ×3
SUT VIC AB 0 CT1 27XBRD ANBCTR (SUTURE) ×1 IMPLANT
SUT VIC AB 1 CT1 27 (SUTURE) ×3
SUT VIC AB 1 CT1 27XBRD ANBCTR (SUTURE) ×1 IMPLANT
SUT VIC AB 3-0 SH 8-18 (SUTURE) ×3 IMPLANT
SYR CONTROL 10ML LL (SYRINGE) IMPLANT
TOWEL OR 17X24 6PK STRL BLUE (TOWEL DISPOSABLE) ×3 IMPLANT
TOWEL OR 17X26 10 PK STRL BLUE (TOWEL DISPOSABLE) ×3 IMPLANT

## 2016-12-28 NOTE — Anesthesia Postprocedure Evaluation (Signed)
Anesthesia Post Note  Patient: Edward [facility]  Procedure(s) Performed: LEFT UNICOMPARTMENTAL KNEE (Left Knee)     Patient location during evaluation: PACU Anesthesia Type: MAC and Spinal Level of consciousness: awake and alert Pain management: pain level controlled Vital Signs Assessment: post-procedure vital signs reviewed and stable Respiratory status: spontaneous breathing and respiratory function stable Cardiovascular status: blood pressure returned to baseline and stable Postop Assessment: spinal receding Anesthetic complications: no    Last Vitals:  Vitals:   12/28/16 1100 12/28/16 1405  BP: (!) 121/55 (!) 106/59  Pulse: 78 73  Resp: 16 15  Temp:  (!) 36.4 C  SpO2: 100% 100%    Last Pain:  Vitals:   12/28/16 1405  TempSrc:   PainSc: 0-No pain                 Ladarren Steiner DANIEL

## 2016-12-28 NOTE — Op Note (Addendum)
12/28/2016  1:46 PM  PATIENT:  Edward [facility]    PRE-OPERATIVE DIAGNOSIS:  Left knee primary localized osteoarthritis  POST-OPERATIVE DIAGNOSIS:  Same  PROCEDURE:  LEFT UNICOMPARTMENTAL KNEE  SURGEON:  Johnny Bridge, MD  PHYSICIAN ASSISTANT: Joya Gaskins, OPA-C, present and scrubbed throughout the case, critical for completion in a timely fashion, and for retraction, instrumentation, and closure.  ANESTHESIA:   Spinal  ESTIMATED BLOOD LOSS: 100 mL  PREOPERATIVE INDICATIONS:  Edward [facility] is a  66 y.o. male with a diagnosis of left knee primary localized anteromedial osteoarthritis who failed conservative measures and elected for surgical management.    The risks benefits and alternatives were discussed with the patient preoperatively including but not limited to the risks of infection, bleeding, nerve injury, cardiopulmonary complications, blood clots, the need for revision surgery, among others, and the patient was willing to proceed.  OPERATIVE IMPLANTS: Biomet [employer] mobile bearing medial compartment arthroplasty femur size medium, tibia size D, bearing size 3.  OPERATIVE FINDINGS: Endstage grade 4 medial compartment osteoarthritis. No significant changes in the lateral or patellofemoral joint.  The ACL was intact.  He was slightly tighter in extension and he was in flexion, but the size 3 was extremely stable and tracked well.  OPERATIVE PROCEDURE: The patient was brought to the operating room placed in supine position. General anesthesia was administered. IV antibiotics were given. The lower extremity was placed in the legholder and prepped and draped in usual sterile fashion.  Time out was performed.  The leg was elevated and exsanguinated and the tourniquet was inflated. Anteromedial incision was performed, and I took care to preserve the MCL. Parapatellar incision was carried out, and the osteophytes were excised, along with the medial meniscus and a small  portion of the fat pad.  The extra medullary tibial cutting jig was applied, using the spoon and the 75mm G-Clamp and the 2 mm shim, and I took care to protect the anterior cruciate ligament insertion and the tibial spine. The medial collateral ligament was also protected, and I resected my proximal tibia, matching the anatomic slope.   The proximal tibial bony cut was removed in one piece, and I turned my attention to the femur.  The intramedullary femoral rod was placed using the drill, and then using the appropriate reference, I assembled the femoral jig, setting my posterior cutting block. I resected my posterior femur, used the 0 spigot for the anterior femur, and then measured my gap.   I then used the appropriate mill to match the extension gap to the flexion gap. The second milling was at a 4.  The gaps were then measured again with the appropriate feeler gauges. Once I had balanced flexion and extension gaps, I then completed the preparation of the femur.  I milled off the anterior aspect of the distal femur to prevent impingement. I also exposed the tibia, and selected the above-named component, and then used the cutting jig to prepare the keel slot on the tibia. I also used the awl to curette out the bone to complete the preparation of the keel. The back wall was intact.  I then placed trial components, and it was found to have excellent motion, and appropriate balance.  I then cemented the components into place, cementing the tibia first, removing all excess cement, and then cementing the femur.  All loose cement was removed.  The real polyethylene insert was applied manually, and the knee was taken through functional range of motion, and found to have  excellent stability and restoration of joint motion, with excellent balance.  The wounds were irrigated copiously, and the parapatellar tissue closed with Vicryl, followed by Vicryl for the subcutaneous tissue, with routine closure with  Steri-Strips and sterile gauze.  The tourniquet was released, and the patient was awakened and extubated and returned to PACU in stable and satisfactory condition. There were no complications.

## 2016-12-28 NOTE — Anesthesia Procedure Notes (Signed)
Anesthesia Regional Block: Adductor canal block   Pre-Anesthetic Checklist: ,, timeout performed, Correct Patient, Correct Site, Correct Laterality, Correct Procedure, Correct Position, site marked, Risks and benefits discussed,  Surgical consent,  Pre-op evaluation,  At surgeon's request and post-op pain management  Laterality: Left  Prep: chloraprep       Needles:  Injection technique: Single-shot  Needle Type: Stimulator Needle - 80     Needle Length: 10cm  Needle Gauge: 21     Additional Needles:   Narrative:  Start time: 12/28/2016 10:41 AM End time: 12/28/2016 11:51 AM Injection made incrementally with aspirations every 5 mL.  Performed by: Personally

## 2016-12-28 NOTE — H&P (Signed)
PREOPERATIVE H&P  Chief Complaint: djd left knee  HPI: Edward Hawkins is a 66 y.o. male who presents for preoperative history and physical with a diagnosis of djd left knee. Symptoms are rated as moderate to severe, and have been worsening.  This is significantly impairing activities of daily living.  He has elected for surgical management.   He has failed injections, activity modification, anti-inflammatories, and assistive devices.  Preoperative X-rays demonstrate end stage degenerative changes with osteophyte formation, loss of joint space, subchondral sclerosis.   Past Medical History:  Diagnosis Date  . Arthritis   . ASTHMA 10/08/2006  . Asthma    last flareup more than 10 yrs ago.  . Cataract    extractions  . Colon polyps    tubular adenomas  . CVA 10/10/2005  . DIABETES MELLITUS, TYPE II 10/08/2006  . Diverticulosis   . HYPERLIPIDEMIA 10/08/2006  . HYPERTENSION 10/08/2006  . RECTAL BLEEDING 09/15/2009  . S/P LASIK surgery of both eyes   . Seasonal allergies    Past Surgical History:  Procedure Laterality Date  . CATARACT EXTRACTION, BILATERAL    . COLONOSCOPY    . EYE SURGERY    . FRACTURE SURGERY    . KNEE SURGERY     left knee x2  . MRI - CERVICAL SPINE WITHOUT N/A 10/28/2016   Performed by Radiologist, Medication, MD at [facility]  . Stress Cardiolite  11/05/2005  . TRANSTHORACIC ECHOCARDIOGRAM  10/12/2005   Social History   Socioeconomic History  . Marital status: Single    Spouse name: None  . Number of children: None  . Years of education: None  . Highest education level: None  Social Needs  . Financial resource strain: None  . Food insecurity - worry: None  . Food insecurity - inability: None  . Transportation needs - medical: None  . Transportation needs - non-medical: None  Occupational History  . Occupation: Works at [employer]  . Smoking status: Never Smoker  . Smokeless tobacco: Never Used  Substance and Sexual Activity  . Alcohol  use: No  . Drug use: No  . Sexual activity: None  Other Topics Concern  . None  Social History Narrative  . None   Family History  Problem Relation Age of Onset  . Cancer Brother        Lung Cancer  . Diabetes Mother   . Diabetes Father   . Cancer Daughter        Breast Cancer  . Colon cancer Neg Hx    No Known Allergies Prior to Admission medications   Medication Sig Start Date End Date Taking? Authorizing Provider  ADVAIR DISKUS 250-50 MCG/DOSE AEPB INHALE 1 PUFF BY MOUTH ONCE DAILY 11/26/16  Yes Renato Shin, MD  aspirin 325 MG tablet Take 325 mg by mouth daily.     Yes [provider]  atorvastatin (LIPITOR) 20 MG tablet TAKE ONE TABLET BY MOUTH ONCE DAILY Patient taking differently: TAKE 20 MG BY MOUTH ONCE DAILY 06/01/16  Yes Renato Shin, MD  Cholecalciferol (EQL VITAMIN D3) 2000 units CAPS Take 2,000 Units daily by mouth.   Yes [provider]  diltiazem (CARDIZEM) 60 MG tablet TAKE 1 TABLET BY MOUTH TWICE DAILY Patient taking differently: TAKE 60 MG BY MOUTH TWICE DAILY 09/23/16  Yes Renato Shin, MD  fish oil-omega-3 fatty acids 1000 MG capsule Take 1 g by mouth daily.   Yes [provider]  losartan (COZAAR) 100 MG tablet TAKE ONE TABLET  BY MOUTH ONCE DAILY Patient taking differently: TAKE 100 MG BY MOUTH ONCE DAILY 05/04/16  Yes Renato Shin, MD  Multiple Vitamin (MULTIVITAMIN) tablet Take 1 tablet by mouth daily.    Yes [provider]  pioglitazone (ACTOS) 15 MG tablet TAKE ONE TABLET BY MOUTH ONCE DAILY Patient taking differently: TAKE 15 MG BY MOUTH ONCE DAILY 06/01/16  Yes Renato Shin, MD  tizanidine (ZANAFLEX) 2 MG capsule Take 1 capsule (2 mg total) 3 (three) times daily as needed by mouth for muscle spasms. 12/14/16  Yes Renato Shin, MD  traMADol (ULTRAM) 50 MG tablet Take 50 mg every 6 (six) hours as needed by mouth for moderate pain.    Yes [provider]  zafirlukast (ACCOLATE) 20 MG tablet TAKE ONE TABLET BY  MOUTH TWICE DAILY Patient taking differently: TAKE 20 MG BY MOUTH TWICE DAILY 12/08/16  Yes Renato Shin, MD     Positive ROS: All other systems have been reviewed and were otherwise negative with the exception of those mentioned in the HPI and as above.  Physical Exam: General: Alert, no acute distress Cardiovascular: No pedal edema Respiratory: No cyanosis, no use of accessory musculature GI: No organomegaly, abdomen is soft and non-tender Skin: No lesions in the area of chief complaint Neurologic: Sensation intact distally Psychiatric: Patient is competent for consent with normal mood and affect Lymphatic: No axillary or cervical lymphadenopathy  MUSCULOSKELETAL: Left knee has pseudolaxity to valgus testing, positive crepitance medially, range of motion 0-120 degrees, Lachman intact, positive painful arc of motion.  Assessment: djd left knee anteromedial   Plan: Plan for Procedure(s): LEFT UNICOMPARTMENTAL KNEE  The risks benefits and alternatives were discussed with the patient including but not limited to the risks of nonoperative treatment, versus surgical intervention including infection, bleeding, nerve injury,  blood clots, cardiopulmonary complications, morbidity, mortality, among others, and they were willing to proceed.   Severity of Illness: The appropriate patient status for this patient is OBSERVATION. Observation status is judged to be reasonable and necessary in order to provide the required intensity of service to ensure the patient's safety. The patient's presenting symptoms, physical exam findings, and initial radiographic and laboratory data in the context of their medical condition is felt to place them at decreased risk for further clinical deterioration. Furthermore, it is anticipated that the patient will be medically stable for discharge from the hospital within 2 midnights of admission. The following factors support the patient status of observation.    " The patient's presenting symptoms include painful arc of motion, left knee. " The physical exam findings include crepitance and medial sided pain with varus alignment. " The initial radiographic and laboratory data are intramedial knee osteoarthritis.     Johnny Bridge, MD Cell [phone]   12/28/2016 10:01 AM

## 2016-12-28 NOTE — Anesthesia Procedure Notes (Signed)
Spinal  Patient location during procedure: OR Start time: 12/28/2016 11:56 AM End time: 12/28/2016 12:06 PM Staffing Anesthesiologist: Duane Boston, MD Performed: anesthesiologist  Preanesthetic Checklist Completed: patient identified, surgical consent, pre-op evaluation, timeout performed, IV checked, risks and benefits discussed and monitors and equipment checked Spinal Block Patient position: sitting Prep: DuraPrep Patient monitoring: cardiac monitor, continuous pulse ox and blood pressure Approach: midline Location: L2-3 Injection technique: single-shot Needle Needle type: Pencan  Needle gauge: 24 G Needle length: 9 cm Additional Notes Functioning IV was confirmed and monitors were applied. Sterile prep and drape, including hand hygiene and sterile gloves were used. The patient was positioned and the spine was prepped. The skin was anesthetized with lidocaine.  Free flow of clear CSF was obtained prior to injecting local anesthetic into the CSF.  The spinal needle aspirated freely following injection.  The needle was carefully withdrawn.  The patient tolerated the procedure well.

## 2016-12-28 NOTE — Transfer of Care (Signed)
Immediate Anesthesia Transfer of Care Note  Patient: Edward Hawkins  Procedure(s) Performed: LEFT UNICOMPARTMENTAL KNEE (Left Knee)  Patient Location: PACU  Anesthesia Type:Spinal  Level of Consciousness: awake, alert  and oriented  Airway & Oxygen Therapy: Patient Spontanous Breathing and Patient connected to nasal cannula oxygen  Post-op Assessment: Report given to RN and Post -op Vital signs reviewed and stable  Post vital signs: Reviewed and stable  Last Vitals:  Vitals:   12/28/16 1100 12/28/16 1405  BP: (!) 121/55   Pulse: 78 73  Resp: 16 15  Temp:  (!) 36.4 C  SpO2: 100% 100%    Last Pain:  Vitals:   12/28/16 1405  TempSrc:   PainSc: (P) 0-No pain         Complications: No apparent anesthesia complications

## 2016-12-28 NOTE — Anesthesia Preprocedure Evaluation (Signed)
Anesthesia Evaluation  Patient identified by MRN, date of birth, ID band Patient awake    Reviewed: Allergy & Precautions, NPO status , Patient's Chart, lab work & pertinent test results  History of Anesthesia Complications Negative for: history of anesthetic complications  Airway Mallampati: III  TM Distance: >3 FB Neck ROM: Full    Dental no notable dental hx. (+) Teeth Intact   Pulmonary asthma ,    Pulmonary exam normal breath sounds clear to auscultation       Cardiovascular hypertension, Pt. on medications + Peripheral Vascular Disease  Normal cardiovascular exam Rhythm:Regular Rate:Normal     Neuro/Psych CVA, No Residual Symptoms negative psych ROS   GI/Hepatic negative GI ROS, Neg liver ROS,   Endo/Other  diabetes, Well Controlled, Type 2, Oral Hypoglycemic AgentsHyperlipidemia Obesity  Renal/GU negative Renal ROS  negative genitourinary   Musculoskeletal Cervical pain   Abdominal   Peds  Hematology negative hematology ROS (+)   Anesthesia Other Findings   Reproductive/Obstetrics                             Anesthesia Physical  Anesthesia Plan  ASA: III  Anesthesia Plan: MAC and Spinal   Post-op Pain Management:  Regional for Post-op pain   Induction:   PONV Risk Score and Plan: 2 and Ondansetron, Propofol infusion and Midazolam  Airway Management Planned: Natural Airway  Additional Equipment:   Intra-op Plan:   Post-operative Plan:   Informed Consent: I have reviewed the patients History and Physical, chart, labs and discussed the procedure including the risks, benefits and alternatives for the proposed anesthesia with the patient or authorized representative who has indicated his/her understanding and acceptance.   Dental advisory given  Plan Discussed with: CRNA, Anesthesiologist and Surgeon  Anesthesia Plan Comments:         Anesthesia Quick  Evaluation

## 2016-12-29 DIAGNOSIS — M1712 Unilateral primary osteoarthritis, left knee: Secondary | ICD-10-CM | POA: Diagnosis not present

## 2016-12-29 LAB — GLUCOSE, CAPILLARY: GLUCOSE-CAPILLARY: 98 mg/dL (ref 65–99)

## 2016-12-29 LAB — CBC
HCT: 34.4 % — ABNORMAL LOW (ref 39.0–52.0)
Hemoglobin: 11.6 g/dL — ABNORMAL LOW (ref 13.0–17.0)
MCH: 32.9 pg (ref 26.0–34.0)
MCHC: 33.7 g/dL (ref 30.0–36.0)
MCV: 97.5 fL (ref 78.0–100.0)
PLATELETS: 237 10*3/uL (ref 150–400)
RBC: 3.53 MIL/uL — AB (ref 4.22–5.81)
RDW: 13.1 % (ref 11.5–15.5)
WBC: 6.2 10*3/uL (ref 4.0–10.5)

## 2016-12-29 LAB — BASIC METABOLIC PANEL
ANION GAP: 6 (ref 5–15)
BUN: 16 mg/dL (ref 6–20)
CO2: 23 mmol/L (ref 22–32)
Calcium: 8.1 mg/dL — ABNORMAL LOW (ref 8.9–10.3)
Chloride: 107 mmol/L (ref 101–111)
Creatinine, Ser: 1.01 mg/dL (ref 0.61–1.24)
Glucose, Bld: 95 mg/dL (ref 65–99)
POTASSIUM: 4 mmol/L (ref 3.5–5.1)
SODIUM: 136 mmol/L (ref 135–145)

## 2016-12-29 NOTE — Discharge Summary (Signed)
Physician Discharge Summary  Patient ID: Edward Hawkins MRN: [id number] DOB/AGE: 1950/03/27 66 y.o.  Admit date: 12/28/2016 Discharge date: 12/29/2016  Admission Diagnoses:  Primary localized osteoarthritis of left knee  Discharge Diagnoses:  Principal Problem:   Primary localized osteoarthritis of left knee Active Problems:   S/P knee replacement   Past Medical History:  Diagnosis Date  . Arthritis   . ASTHMA 10/08/2006  . Asthma    last flareup more than 10 yrs ago.  . Cataract    extractions  . Colon polyps    tubular adenomas  . CVA 10/10/2005  . DIABETES MELLITUS, TYPE II 10/08/2006  . Diverticulosis   . HYPERLIPIDEMIA 10/08/2006  . HYPERTENSION 10/08/2006  . Primary localized osteoarthritis of left knee 12/28/2016  . RECTAL BLEEDING 09/15/2009  . S/P LASIK surgery of both eyes   . Seasonal allergies     Surgeries: Procedure(s): LEFT UNICOMPARTMENTAL KNEE on 12/28/2016   Consultants (if any):   Discharged Condition: Improved  Hospital Course: Jimie Kuwahara is an 66 y.o. male who was admitted 12/28/2016 with a diagnosis of Primary localized osteoarthritis of left knee and went to the operating room on 12/28/2016 and underwent the above named procedures.    He was given perioperative antibiotics:  Anti-infectives (From admission, onward)   Start     Dose/Rate Route Frequency Ordered Stop   12/28/16 1800  ceFAZolin (ANCEF) IVPB 2g/100 mL premix     2 g 200 mL/hr over 30 Minutes Intravenous Every 6 hours 12/28/16 1710 12/29/16 0015   12/28/16 1100  ceFAZolin (ANCEF) IVPB 2g/100 mL premix     2 g 200 mL/hr over 30 Minutes Intravenous To ShortStay Surgical 12/27/16 1126 12/28/16 1209    .  He was given sequential compression devices, early ambulation, and xarelto (might use aspirin 325 bid if insurance creates a problem) for DVT prophylaxis.  He benefited maximally from the hospital stay and there were no complications.    Recent vital signs:   Vitals:   12/29/16 0204 12/29/16 0552  BP: 124/77 132/74  Pulse: 77 81  Resp: 16 18  Temp: 98.4 F (36.9 C) 98.1 F (36.7 C)  SpO2: 96% 96%    Recent laboratory studies:  Lab Results  Component Value Date   HGB 11.6 (L) 12/29/2016   HGB 13.3 12/20/2016   HGB 13.3 11/01/2016   Lab Results  Component Value Date   WBC 6.2 12/29/2016   PLT 237 12/29/2016   No results found for: INR Lab Results  Component Value Date   NA 136 12/29/2016   K 4.0 12/29/2016   CL 107 12/29/2016   CO2 23 12/29/2016   BUN 16 12/29/2016   CREATININE 1.01 12/29/2016   GLUCOSE 95 12/29/2016    Discharge Medications:   Allergies as of 12/29/2016   No Known Allergies     Medication List    STOP taking these medications   aspirin 325 MG tablet   traMADol 50 MG tablet Commonly known as:  ULTRAM     TAKE these medications   ADVAIR DISKUS 250-50 MCG/DOSE Aepb Generic drug:  Fluticasone-Salmeterol INHALE 1 PUFF BY MOUTH ONCE DAILY   atorvastatin 20 MG tablet Commonly known as:  LIPITOR TAKE ONE TABLET BY MOUTH ONCE DAILY What changed:    how much to take  how to take this  when to take this   diltiazem 60 MG tablet Commonly known as:  CARDIZEM TAKE 1 TABLET BY MOUTH TWICE DAILY What changed:    how  much to take  how to take this  when to take this   EQL VITAMIN D3 2000 units Caps Generic drug:  Cholecalciferol Take 2,000 Units daily by mouth.   fish oil-omega-3 fatty acids 1000 MG capsule Take 1 g by mouth daily.   losartan 100 MG tablet Commonly known as:  COZAAR TAKE ONE TABLET BY MOUTH ONCE DAILY What changed:    how much to take  how to take this  when to take this   multivitamin tablet Take 1 tablet by mouth daily.   ondansetron 4 MG tablet Commonly known as:  ZOFRAN Take 1 tablet (4 mg total) by mouth every 8 (eight) hours as needed for nausea or vomiting.   oxyCODONE 5 MG immediate release tablet Commonly known as:  ROXICODONE Take 1-2 tablets  (5-10 mg total) by mouth every 4 (four) hours as needed for severe pain.   pioglitazone 15 MG tablet Commonly known as:  ACTOS TAKE ONE TABLET BY MOUTH ONCE DAILY What changed:    how much to take  how to take this  when to take this   sennosides-docusate sodium 8.6-50 MG tablet Commonly known as:  SENOKOT-S Take 2 tablets by mouth daily.   tizanidine 2 MG capsule Commonly known as:  ZANAFLEX Take 1 capsule (2 mg total) 3 (three) times daily as needed by mouth for muscle spasms.   zafirlukast 20 MG tablet Commonly known as:  ACCOLATE TAKE ONE TABLET BY MOUTH TWICE DAILY What changed:    how much to take  how to take this  when to take this       Diagnostic Studies: Dg Knee Left Port  Result Date: 12/28/2016 CLINICAL DATA:  Status unicompartmental knee replacement EXAM: PORTABLE LEFT KNEE - 1-2 VIEW COMPARISON:  None. FINDINGS: Frontal and lateral views obtained. There is a medially unicompartmental knee replacement with prosthetic components well-seated. No fracture or dislocation. Other joint spaces appear unremarkable. There is soft tissue air in the joint, an expected postoperative finding. IMPRESSION: Medial unicompartmental knee replacement with prosthetic components well-seated. Other joint spaces appear unremarkable. No fracture or dislocation. Soft tissue air in the joint is an expected postoperative finding. Electronically Signed   By: Lowella Grip III M.D.   On: 12/28/2016 14:48    Disposition: 01-Home or Self Care    Follow-up Information    Marchia Bond, MD. Schedule an appointment as soon as possible for a visit in 2 weeks.   Specialty:  Orthopedic Surgery Contact information: [address] [place] [address] [phone]            Signed: Johnny Bridge 12/29/2016, 9:07 AM

## 2016-12-29 NOTE — Progress Notes (Signed)
1107 Patient discharged to home in stable condition. Verbalizes understanding of all discharge instructions including wound care, discharge medications, and follow up MD visits. Patient accompanied by family.   Per patient, as advised by Marchia Bond, MD, he will begin taking Aspirin 325mg  twice daily for anticoagulation therapy.

## 2016-12-29 NOTE — Evaluation (Signed)
Physical Therapy Evaluation Patient Details Name: Edward Hawkins MRN: [id number] DOB: 02/28/1950 Today's Date: 12/29/2016   History of Present Illness  66 yo male with onset of OA L knee was admitted for unicompartmental knee surgery on 11/20.   Clinical Impression  Pt is up to walk with PT and then reviewed all stair safety and finally finished the education of knee precautions and HEP.  He is prepared with his wife assisting to manage safely at home and will follow up with outpatient likely but if not able should go to Home Health services first.    Follow Up Recommendations Outpatient PT;Home health PT;Supervision for mobility/OOB    Equipment Recommendations  None recommended by PT    Recommendations for Other Services       Precautions / Restrictions Precautions Precautions: Knee;Fall Precaution Booklet Issued: Yes (comment) Precaution Comments: reviewed instructions and exercises Required Braces or Orthoses: Knee Immobilizer - Left Knee Immobilizer - Left: On when out of bed or walking Restrictions Weight Bearing Restrictions: Yes LLE Weight Bearing: Weight bearing as tolerated      Mobility  Bed Mobility Overal bed mobility: Modified Independent             General bed mobility comments: using  HOB elevated and bed rail  Transfers Overall transfer level: Needs assistance Equipment used: Rolling walker (2 wheeled) Transfers: Sit to/from Stand Sit to Stand: Min guard         General transfer comment: reminders for hand placement and safety as well as managment of the brace to sit.  Ambulation/Gait Ambulation/Gait assistance: Min guard;Min assist Ambulation Distance (Feet): 100 Feet Assistive device: Rolling walker (2 wheeled);1 person hand held assist(second person, wife is along to observe to assist him later) Gait Pattern/deviations: Step-through pattern;Step-to pattern;Wide base of support;Drifts right/left;Trunk flexed;Decreased stride  length;Decreased weight shift to left Gait velocity: reduced Gait velocity interpretation: Below normal speed for age/gender General Gait Details: slow pace and cued for offloading L knee on the walker  Stairs Stairs: Yes Stairs assistance: Min assist;Min guard(wife observed with sequence and use of single railing) Stair Management: One rail Right;Forwards;Step to pattern Number of Stairs: 4 General stair comments: pt is awkward coming down step initially but finally can control the descent  Wheelchair Mobility    Modified Rankin (Stroke Patients Only)       Balance Overall balance assessment: Needs assistance Sitting-balance support: Feet supported Sitting balance-Leahy Scale: Good     Standing balance support: Bilateral upper extremity supported;During functional activity Standing balance-Leahy Scale: Fair Standing balance comment: fair to balance after walk                             Pertinent Vitals/Pain Pain Assessment: 0-10 Pain Score: 6  Pain Location: L knee Pain Descriptors / Indicators: Operative site guarding Pain Intervention(s): Limited activity within patient's tolerance;Monitored during session;Premedicated before session;Repositioned;Ice applied    Home Living Family/patient expects to be discharged to:: Private residence Living Arrangements: Spouse/significant other Available Help at Discharge: Family;Available 24 hours/day Type of Home: House Home Access: Stairs to enter Entrance Stairs-Rails: Right;Left;Can reach both Entrance Stairs-Number of Steps: 3 Home Layout: One level Home Equipment: Walker - 2 wheels      Prior Function Level of Independence: Independent with assistive device(s)               Hand Dominance   Dominant Hand: Right    Extremity/Trunk Assessment   Upper Extremity Assessment Upper Extremity  Assessment: Overall WFL for tasks assessed    Lower Extremity Assessment Lower Extremity Assessment: LLE  deficits/detail LLE Deficits / Details: pain with mobility esp to flex knee LLE: Unable to fully assess due to pain;Unable to fully assess due to immobilization LLE Sensation: decreased proprioception LLE Coordination: decreased fine motor;decreased gross motor    Cervical / Trunk Assessment Cervical / Trunk Assessment: Normal  Communication   Communication: No difficulties  Cognition Arousal/Alertness: Awake/alert Behavior During Therapy: WFL for tasks assessed/performed Overall Cognitive Status: Within Functional Limits for tasks assessed                                        General Comments      Exercises Total Joint Exercises Ankle Circles/Pumps: AROM;Both;5 reps Quad Sets: AROM;Both;10 reps Heel Slides: AROM;Both;10 reps Hip ABduction/ADduction: AROM;AAROM;Both;10 reps Straight Leg Raises: AROM;AAROM;Both;10 reps   Assessment/Plan    PT Assessment Patient needs continued PT services  PT Problem List Decreased range of motion;Decreased strength;Decreased activity tolerance;Decreased balance;Decreased mobility;Decreased coordination;Decreased knowledge of use of DME;Decreased safety awareness;Decreased knowledge of precautions;Decreased skin integrity;Pain       PT Treatment Interventions DME instruction;Gait training;Stair training;Functional mobility training;Therapeutic activities;Therapeutic exercise;Balance training;Neuromuscular re-education;Patient/family education    PT Goals (Current goals can be found in the Care Plan section)  Acute Rehab PT Goals Patient Stated Goal: to walk and get up stairs to get home PT Goal Formulation: With patient Time For Goal Achievement: 01/12/17 Potential to Achieve Goals: Good    Frequency 7X/week   Barriers to discharge Inaccessible home environment stairs to enter house    Co-evaluation               AM-PAC PT "6 Clicks" Daily Activity  Outcome Measure Difficulty turning over in bed (including  adjusting bedclothes, sheets and blankets)?: A Little Difficulty moving from lying on back to sitting on the side of the bed? : Unable Difficulty sitting down on and standing up from a chair with arms (e.g., wheelchair, bedside commode, etc,.)?: Unable Help needed moving to and from a bed to chair (including a wheelchair)?: A Little Help needed walking in hospital room?: A Little Help needed climbing 3-5 steps with a railing? : A Little 6 Click Score: 14    End of Session Equipment Utilized During Treatment: Gait belt Activity Tolerance: Patient tolerated treatment well;Patient limited by fatigue Patient left: in bed;with call bell/phone within reach;with family/visitor present Nurse Communication: Mobility status PT Visit Diagnosis: Unsteadiness on feet (R26.81);Pain;Other abnormalities of gait and mobility (R26.89);Muscle weakness (generalized) (M62.81) Pain - Right/Left: Left Pain - part of body: Knee    Time: 2671-2458 PT Time Calculation (min) (ACUTE ONLY): 39 min   Charges:   PT Evaluation $PT Eval Moderate Complexity: 1 Mod PT Treatments $Gait Training: 8-22 mins $Therapeutic Exercise: 8-22 mins   PT G Codes:   PT G-Codes **NOT FOR INPATIENT CLASS** Functional Assessment Tool Used: AM-PAC 6 Clicks Basic Mobility;Clinical judgement Functional Limitation: Mobility: Walking and moving around Mobility: Walking and Moving Around Current Status (K9983): At least 20 percent but less than 40 percent impaired, limited or restricted Mobility: Walking and Moving Around Goal Status [id number]): At least 1 percent but less than 20 percent impaired, limited or restricted    Ramond Dial 12/29/2016, 1:51 PM   Mee Hives, PT MS Acute Rehab Dept. Number: [place] [facility] [place]

## 2016-12-29 NOTE — Discharge Instructions (Signed)
INSTRUCTIONS AFTER JOINT REPLACEMENT  ° °o Remove items at home which could result in a fall. This includes throw rugs or furniture in walking pathways °o ICE to the affected joint every three hours while awake for 30 minutes at a time, for at least the first 3-5 days, and then as needed for pain and swelling.  Continue to use ice for pain and swelling. You may notice swelling that will progress down to the foot and ankle.  This is normal after surgery.  Elevate your leg when you are not up walking on it.   °o Continue to use the breathing machine you got in the hospital (incentive spirometer) which will help keep your temperature down.  It is common for your temperature to cycle up and down following surgery, especially at night when you are not up moving around and exerting yourself.  The breathing machine keeps your lungs expanded and your temperature down. ° ° °DIET:  As you were doing prior to hospitalization, we recommend a well-balanced diet. ° °DRESSING / WOUND CARE / SHOWERING ° °You may change your dressing 3-5 days after surgery.  Then change the dressing every day with sterile gauze.  Please use good hand washing techniques before changing the dressing.  Do not use any lotions or creams on the incision until instructed by your surgeon. ° °ACTIVITY ° °o Increase activity slowly as tolerated, but follow the weight bearing instructions below.   °o No driving for 6 weeks or until further direction given by your physician.  You cannot drive while taking narcotics.  °o No lifting or carrying greater than 10 lbs. until further directed by your surgeon. °o Avoid periods of inactivity such as sitting longer than an hour when not asleep. This helps prevent blood clots.  °o You may return to work once you are authorized by your doctor.  ° ° ° °WEIGHT BEARING  ° °Weight bearing as tolerated with assist device (walker, cane, etc) as directed, use it as long as suggested by your surgeon or therapist, typically at  least 4-6 weeks. ° ° °EXERCISES ° °Results after joint replacement surgery are often greatly improved when you follow the exercise, range of motion and muscle strengthening exercises prescribed by your doctor. Safety measures are also important to protect the joint from further injury. Any time any of these exercises cause you to have increased pain or swelling, decrease what you are doing until you are comfortable again and then slowly increase them. If you have problems or questions, call your caregiver or physical therapist for advice.  ° °Rehabilitation is important following a joint replacement. After just a few days of immobilization, the muscles of the leg can become weakened and shrink (atrophy).  These exercises are designed to build up the tone and strength of the thigh and leg muscles and to improve motion. Often times heat used for twenty to thirty minutes before working out will loosen up your tissues and help with improving the range of motion but do not use heat for the first two weeks following surgery (sometimes heat can increase post-operative swelling).  ° °These exercises can be done on a training (exercise) mat, on the floor, on a table or on a bed. Use whatever works the best and is most comfortable for you.    Use music or television while you are exercising so that the exercises are a pleasant break in your day. This will make your life better with the exercises acting as a break   in your routine that you can look forward to.   Perform all exercises about fifteen times, three times per day or as directed.  You should exercise both the operative leg and the other leg as well. ° °Exercises include: °  °• Quad Sets - Tighten up the muscle on the front of the thigh (Quad) and hold for 5-10 seconds.   °• Straight Leg Raises - With your knee straight (if you were given a brace, keep it on), lift the leg to 60 degrees, hold for 3 seconds, and slowly lower the leg.  Perform this exercise against  resistance later as your leg gets stronger.  °• Leg Slides: Lying on your back, slowly slide your foot toward your buttocks, bending your knee up off the floor (only go as far as is comfortable). Then slowly slide your foot back down until your leg is flat on the floor again.  °• Angel Wings: Lying on your back spread your legs to the side as far apart as you can without causing discomfort.  °• Hamstring Strength:  Lying on your back, push your heel against the floor with your leg straight by tightening up the muscles of your buttocks.  Repeat, but this time bend your knee to a comfortable angle, and push your heel against the floor.  You may put a pillow under the heel to make it more comfortable if necessary.  ° °A rehabilitation program following joint replacement surgery can speed recovery and prevent re-injury in the future due to weakened muscles. Contact your doctor or a physical therapist for more information on knee rehabilitation.  ° ° °CONSTIPATION ° °Constipation is defined medically as fewer than three stools per week and severe constipation as less than one stool per week.  Even if you have a regular bowel pattern at home, your normal regimen is likely to be disrupted due to multiple reasons following surgery.  Combination of anesthesia, postoperative narcotics, change in appetite and fluid intake all can affect your bowels.  ° °YOU MUST use at least one of the following options; they are listed in order of increasing strength to get the job done.  They are all available over the counter, and you may need to use some, POSSIBLY even all of these options:   ° °Drink plenty of fluids (prune juice may be helpful) and high fiber foods °Colace 100 mg by mouth twice a day  °Senokot for constipation as directed and as needed Dulcolax (bisacodyl), take with full glass of water  °Miralax (polyethylene glycol) once or twice a day as needed. ° °If you have tried all these things and are unable to have a bowel  movement in the first 3-4 days after surgery call either your surgeon or your primary doctor.   ° °If you experience loose stools or diarrhea, hold the medications until you stool forms back up.  If your symptoms do not get better within 1 week or if they get worse, check with your doctor.  If you experience "the worst abdominal pain ever" or develop nausea or vomiting, please contact the office immediately for further recommendations for treatment. ° ° °ITCHING:  If you experience itching with your medications, try taking only a single pain pill, or even half a pain pill at a time.  You can also use Benadryl over the counter for itching or also to help with sleep.  ° °TED HOSE STOCKINGS:  Use stockings on both legs until for at least 2 weeks or as   directed by physician office. They may be removed at night for sleeping. ° °MEDICATIONS:  See your medication summary on the “After Visit Summary” that nursing will review with you.  You may have some home medications which will be placed on hold until you complete the course of blood thinner medication.  It is important for you to complete the blood thinner medication as prescribed. ° °PRECAUTIONS:  If you experience chest pain or shortness of breath - call 911 immediately for transfer to the hospital emergency department.  ° °If you develop a fever greater that 101 F, purulent drainage from wound, increased redness or drainage from wound, foul odor from the wound/dressing, or calf pain - CONTACT YOUR SURGEON.   °                                                °FOLLOW-UP APPOINTMENTS:  If you do not already have a post-op appointment, please call the office for an appointment to be seen by your surgeon.  Guidelines for how soon to be seen are listed in your “After Visit Summary”, but are typically between 1-4 weeks after surgery. ° ° °MAKE SURE YOU:  °• Understand these instructions.  °• Get help right away if you are not doing well or get worse.  ° ° °Thank you for  letting us be a part of your medical care team.  It is a privilege we respect greatly.  We hope these instructions will help you stay on track for a fast and full recovery!  ° °Information on my medicine - XARELTO® (Rivaroxaban) ° °Why was Xarelto® prescribed for you? °Xarelto® was prescribed for you to reduce the risk of blood clots forming after orthopedic surgery. The medical term for these abnormal blood clots is venous thromboembolism (VTE). ° °What do you need to know about xarelto® ? °Take your Xarelto® ONCE DAILY at the same time every day. °You may take it either with or without food. ° °If you have difficulty swallowing the tablet whole, you may crush it and mix in applesauce just prior to taking your dose. ° °Take Xarelto® exactly as prescribed by your doctor and DO NOT stop taking Xarelto® without talking to the doctor who prescribed the medication.  Stopping without other VTE prevention medication to take the place of Xarelto® may increase your risk of developing a clot. ° °After discharge, you should have regular check-up appointments with your healthcare provider that is prescribing your Xarelto®.   ° °What do you do if you miss a dose? °If you miss a dose, take it as soon as you remember on the same day then continue your regularly scheduled once daily regimen the next day. Do not take two doses of Xarelto® on the same day.  ° °Important Safety Information °A possible side effect of Xarelto® is bleeding. You should call your healthcare provider right away if you experience any of the following: °? Bleeding from an injury or your nose that does not stop. °? Unusual colored urine (red or dark brown) or unusual colored stools (red or black). °? Unusual bruising for unknown reasons. °? A serious fall or if you hit your head (even if there is no bleeding). ° °Some medicines may interact with Xarelto® and might increase your risk of bleeding while on Xarelto®. To help avoid this, consult your healthcare  provider   or pharmacist prior to using any new prescription or non-prescription medications, including herbals, vitamins, non-steroidal anti-inflammatory drugs (NSAIDs) and supplements. ° °This website has more information on Xarelto®: www.xarelto.com. ° ° °

## 2016-12-31 ENCOUNTER — Encounter (HOSPITAL_COMMUNITY): Payer: Self-pay | Admitting: Orthopedic Surgery

## 2017-01-05 DIAGNOSIS — Z8673 Personal history of transient ischemic attack (TIA), and cerebral infarction without residual deficits: Secondary | ICD-10-CM | POA: Diagnosis not present

## 2017-01-05 DIAGNOSIS — E119 Type 2 diabetes mellitus without complications: Secondary | ICD-10-CM | POA: Diagnosis not present

## 2017-01-05 DIAGNOSIS — Z9181 History of falling: Secondary | ICD-10-CM | POA: Diagnosis not present

## 2017-01-05 DIAGNOSIS — I10 Essential (primary) hypertension: Secondary | ICD-10-CM | POA: Diagnosis not present

## 2017-01-05 DIAGNOSIS — Z7951 Long term (current) use of inhaled steroids: Secondary | ICD-10-CM | POA: Diagnosis not present

## 2017-01-05 DIAGNOSIS — K5790 Diverticulosis of intestine, part unspecified, without perforation or abscess without bleeding: Secondary | ICD-10-CM | POA: Diagnosis not present

## 2017-01-05 DIAGNOSIS — Z96652 Presence of left artificial knee joint: Secondary | ICD-10-CM | POA: Diagnosis not present

## 2017-01-05 DIAGNOSIS — Z471 Aftercare following joint replacement surgery: Secondary | ICD-10-CM | POA: Diagnosis not present

## 2017-01-05 DIAGNOSIS — J45909 Unspecified asthma, uncomplicated: Secondary | ICD-10-CM | POA: Diagnosis not present

## 2017-01-05 DIAGNOSIS — Z7984 Long term (current) use of oral hypoglycemic drugs: Secondary | ICD-10-CM | POA: Diagnosis not present

## 2017-01-06 DIAGNOSIS — Z8673 Personal history of transient ischemic attack (TIA), and cerebral infarction without residual deficits: Secondary | ICD-10-CM | POA: Diagnosis not present

## 2017-01-06 DIAGNOSIS — E119 Type 2 diabetes mellitus without complications: Secondary | ICD-10-CM | POA: Diagnosis not present

## 2017-01-06 DIAGNOSIS — I10 Essential (primary) hypertension: Secondary | ICD-10-CM | POA: Diagnosis not present

## 2017-01-06 DIAGNOSIS — Z7984 Long term (current) use of oral hypoglycemic drugs: Secondary | ICD-10-CM | POA: Diagnosis not present

## 2017-01-06 DIAGNOSIS — K5790 Diverticulosis of intestine, part unspecified, without perforation or abscess without bleeding: Secondary | ICD-10-CM | POA: Diagnosis not present

## 2017-01-06 DIAGNOSIS — J45909 Unspecified asthma, uncomplicated: Secondary | ICD-10-CM | POA: Diagnosis not present

## 2017-01-06 DIAGNOSIS — Z7951 Long term (current) use of inhaled steroids: Secondary | ICD-10-CM | POA: Diagnosis not present

## 2017-01-06 DIAGNOSIS — Z471 Aftercare following joint replacement surgery: Secondary | ICD-10-CM | POA: Diagnosis not present

## 2017-01-06 DIAGNOSIS — Z96652 Presence of left artificial knee joint: Secondary | ICD-10-CM | POA: Diagnosis not present

## 2017-01-06 DIAGNOSIS — Z9181 History of falling: Secondary | ICD-10-CM | POA: Diagnosis not present

## 2017-01-10 ENCOUNTER — Ambulatory Visit (HOSPITAL_COMMUNITY): Payer: PPO

## 2017-01-10 ENCOUNTER — Other Ambulatory Visit (HOSPITAL_COMMUNITY): Payer: Self-pay | Admitting: Orthopedic Surgery

## 2017-01-10 DIAGNOSIS — Z8673 Personal history of transient ischemic attack (TIA), and cerebral infarction without residual deficits: Secondary | ICD-10-CM | POA: Diagnosis not present

## 2017-01-10 DIAGNOSIS — Z7984 Long term (current) use of oral hypoglycemic drugs: Secondary | ICD-10-CM | POA: Diagnosis not present

## 2017-01-10 DIAGNOSIS — I10 Essential (primary) hypertension: Secondary | ICD-10-CM | POA: Diagnosis not present

## 2017-01-10 DIAGNOSIS — Z9181 History of falling: Secondary | ICD-10-CM | POA: Diagnosis not present

## 2017-01-10 DIAGNOSIS — Z471 Aftercare following joint replacement surgery: Secondary | ICD-10-CM | POA: Diagnosis not present

## 2017-01-10 DIAGNOSIS — Z96652 Presence of left artificial knee joint: Secondary | ICD-10-CM | POA: Diagnosis not present

## 2017-01-10 DIAGNOSIS — J45909 Unspecified asthma, uncomplicated: Secondary | ICD-10-CM | POA: Diagnosis not present

## 2017-01-10 DIAGNOSIS — E119 Type 2 diabetes mellitus without complications: Secondary | ICD-10-CM | POA: Diagnosis not present

## 2017-01-10 DIAGNOSIS — M79605 Pain in left leg: Secondary | ICD-10-CM

## 2017-01-10 DIAGNOSIS — Z7951 Long term (current) use of inhaled steroids: Secondary | ICD-10-CM | POA: Diagnosis not present

## 2017-01-10 DIAGNOSIS — K5790 Diverticulosis of intestine, part unspecified, without perforation or abscess without bleeding: Secondary | ICD-10-CM | POA: Diagnosis not present

## 2017-01-10 DIAGNOSIS — M7989 Other specified soft tissue disorders: Principal | ICD-10-CM

## 2017-01-10 DIAGNOSIS — M1712 Unilateral primary osteoarthritis, left knee: Secondary | ICD-10-CM | POA: Diagnosis not present

## 2017-01-19 ENCOUNTER — Other Ambulatory Visit: Payer: Self-pay | Admitting: Endocrinology

## 2017-01-19 DIAGNOSIS — R262 Difficulty in walking, not elsewhere classified: Secondary | ICD-10-CM | POA: Diagnosis not present

## 2017-01-19 DIAGNOSIS — M25662 Stiffness of left knee, not elsewhere classified: Secondary | ICD-10-CM | POA: Diagnosis not present

## 2017-01-19 DIAGNOSIS — M6281 Muscle weakness (generalized): Secondary | ICD-10-CM | POA: Diagnosis not present

## 2017-01-19 DIAGNOSIS — M25562 Pain in left knee: Secondary | ICD-10-CM | POA: Diagnosis not present

## 2017-01-20 DIAGNOSIS — M6281 Muscle weakness (generalized): Secondary | ICD-10-CM | POA: Diagnosis not present

## 2017-01-20 DIAGNOSIS — M25562 Pain in left knee: Secondary | ICD-10-CM | POA: Diagnosis not present

## 2017-01-20 DIAGNOSIS — M25662 Stiffness of left knee, not elsewhere classified: Secondary | ICD-10-CM | POA: Diagnosis not present

## 2017-01-20 DIAGNOSIS — R262 Difficulty in walking, not elsewhere classified: Secondary | ICD-10-CM | POA: Diagnosis not present

## 2017-01-25 DIAGNOSIS — M25562 Pain in left knee: Secondary | ICD-10-CM | POA: Diagnosis not present

## 2017-01-25 DIAGNOSIS — M25662 Stiffness of left knee, not elsewhere classified: Secondary | ICD-10-CM | POA: Diagnosis not present

## 2017-01-25 DIAGNOSIS — M6281 Muscle weakness (generalized): Secondary | ICD-10-CM | POA: Diagnosis not present

## 2017-01-25 DIAGNOSIS — R262 Difficulty in walking, not elsewhere classified: Secondary | ICD-10-CM | POA: Diagnosis not present

## 2017-01-28 DIAGNOSIS — R262 Difficulty in walking, not elsewhere classified: Secondary | ICD-10-CM | POA: Diagnosis not present

## 2017-01-28 DIAGNOSIS — Z96652 Presence of left artificial knee joint: Secondary | ICD-10-CM | POA: Diagnosis not present

## 2017-01-28 DIAGNOSIS — M6281 Muscle weakness (generalized): Secondary | ICD-10-CM | POA: Diagnosis not present

## 2017-01-28 DIAGNOSIS — M25662 Stiffness of left knee, not elsewhere classified: Secondary | ICD-10-CM | POA: Diagnosis not present

## 2017-01-28 DIAGNOSIS — M25562 Pain in left knee: Secondary | ICD-10-CM | POA: Diagnosis not present

## 2017-01-31 DIAGNOSIS — M6281 Muscle weakness (generalized): Secondary | ICD-10-CM | POA: Diagnosis not present

## 2017-01-31 DIAGNOSIS — Z96652 Presence of left artificial knee joint: Secondary | ICD-10-CM | POA: Diagnosis not present

## 2017-01-31 DIAGNOSIS — M25562 Pain in left knee: Secondary | ICD-10-CM | POA: Diagnosis not present

## 2017-01-31 DIAGNOSIS — R262 Difficulty in walking, not elsewhere classified: Secondary | ICD-10-CM | POA: Diagnosis not present

## 2017-01-31 DIAGNOSIS — M25662 Stiffness of left knee, not elsewhere classified: Secondary | ICD-10-CM | POA: Diagnosis not present

## 2017-02-03 DIAGNOSIS — R262 Difficulty in walking, not elsewhere classified: Secondary | ICD-10-CM | POA: Diagnosis not present

## 2017-02-03 DIAGNOSIS — M25562 Pain in left knee: Secondary | ICD-10-CM | POA: Diagnosis not present

## 2017-02-03 DIAGNOSIS — M25662 Stiffness of left knee, not elsewhere classified: Secondary | ICD-10-CM | POA: Diagnosis not present

## 2017-02-03 DIAGNOSIS — M6281 Muscle weakness (generalized): Secondary | ICD-10-CM | POA: Diagnosis not present

## 2017-02-07 DIAGNOSIS — R262 Difficulty in walking, not elsewhere classified: Secondary | ICD-10-CM | POA: Diagnosis not present

## 2017-02-07 DIAGNOSIS — M25562 Pain in left knee: Secondary | ICD-10-CM | POA: Diagnosis not present

## 2017-02-07 DIAGNOSIS — M25662 Stiffness of left knee, not elsewhere classified: Secondary | ICD-10-CM | POA: Diagnosis not present

## 2017-02-07 DIAGNOSIS — M6281 Muscle weakness (generalized): Secondary | ICD-10-CM | POA: Diagnosis not present

## 2017-02-09 DIAGNOSIS — M25562 Pain in left knee: Secondary | ICD-10-CM | POA: Diagnosis not present

## 2017-02-11 DIAGNOSIS — R262 Difficulty in walking, not elsewhere classified: Secondary | ICD-10-CM | POA: Diagnosis not present

## 2017-02-11 DIAGNOSIS — M6281 Muscle weakness (generalized): Secondary | ICD-10-CM | POA: Diagnosis not present

## 2017-02-11 DIAGNOSIS — M25562 Pain in left knee: Secondary | ICD-10-CM | POA: Diagnosis not present

## 2017-02-11 DIAGNOSIS — M25662 Stiffness of left knee, not elsewhere classified: Secondary | ICD-10-CM | POA: Diagnosis not present

## 2017-02-14 DIAGNOSIS — M25662 Stiffness of left knee, not elsewhere classified: Secondary | ICD-10-CM | POA: Diagnosis not present

## 2017-02-14 DIAGNOSIS — R262 Difficulty in walking, not elsewhere classified: Secondary | ICD-10-CM | POA: Diagnosis not present

## 2017-02-14 DIAGNOSIS — M6281 Muscle weakness (generalized): Secondary | ICD-10-CM | POA: Diagnosis not present

## 2017-02-14 DIAGNOSIS — M25562 Pain in left knee: Secondary | ICD-10-CM | POA: Diagnosis not present

## 2017-02-16 ENCOUNTER — Ambulatory Visit (INDEPENDENT_AMBULATORY_CARE_PROVIDER_SITE_OTHER): Payer: PPO | Admitting: Endocrinology

## 2017-02-16 ENCOUNTER — Encounter: Payer: Self-pay | Admitting: Endocrinology

## 2017-02-16 VITALS — BP 138/78 | HR 118 | Wt 253.2 lb

## 2017-02-16 DIAGNOSIS — E119 Type 2 diabetes mellitus without complications: Secondary | ICD-10-CM

## 2017-02-16 LAB — POCT GLYCOSYLATED HEMOGLOBIN (HGB A1C): Hemoglobin A1C: 6

## 2017-02-16 MED ORDER — PROMETHAZINE-CODEINE 6.25-10 MG/5ML PO SYRP: 5.0000 mL | ORAL_SOLUTION | ORAL | 0 refills | Status: DC | PRN

## 2017-02-16 NOTE — Patient Instructions (Signed)
I have sent a prescription to your pharmacy, for the cough syrup.  I hope you feel better soon.  If you don't feel better by next week, please call back, so we can check the chest x-ray.  Please call sooner if you get worse.

## 2017-02-16 NOTE — Progress Notes (Signed)
Subjective:    Patient ID: Edward Hawkins, male    DOB: May 12, 1950, 67 y.o.   MRN: [id number]  HPI Pt states 4 days of moderate dry-quality cough in the chest, but no assoc fever.   Past Medical History:  Diagnosis Date  . Arthritis   . ASTHMA 10/08/2006  . Asthma    last flareup more than 10 yrs ago.  . Cataract    extractions  . Colon polyps    tubular adenomas  . CVA 10/10/2005  . DIABETES MELLITUS, TYPE II 10/08/2006  . Diverticulosis   . HYPERLIPIDEMIA 10/08/2006  . HYPERTENSION 10/08/2006  . Primary localized osteoarthritis of left knee 12/28/2016  . RECTAL BLEEDING 09/15/2009  . S/P LASIK surgery of both eyes   . Seasonal allergies     Past Surgical History:  Procedure Laterality Date  . CATARACT EXTRACTION, BILATERAL    . COLONOSCOPY    . EYE SURGERY    . FRACTURE SURGERY    . KNEE SURGERY     left knee x2  . PARTIAL KNEE ARTHROPLASTY Left 12/28/2016   Procedure: LEFT UNICOMPARTMENTAL KNEE;  Surgeon: Marchia Bond, MD;  Location: [facility];  Service: Orthopedics;  Laterality: Left;  . RADIOLOGY WITH ANESTHESIA N/A 10/28/2016   Procedure: MRI - CERVICAL SPINE WITHOUT;  Surgeon: Radiologist, Medication, MD;  Location: [facility];  Service: Radiology;  Laterality: N/A;  . Stress Cardiolite  11/05/2005  . TRANSTHORACIC ECHOCARDIOGRAM  10/12/2005    Social History   Socioeconomic History  . Marital status: Single    Spouse name: Not on file  . Number of children: Not on file  . Years of education: Not on file  . Highest education level: Not on file  Social Needs  . Financial resource strain: Not on file  . Food insecurity - worry: Not on file  . Food insecurity - inability: Not on file  . Transportation needs - medical: Not on file  . Transportation needs - non-medical: Not on file  Occupational History  . Occupation: Works at [employer]  . Smoking status: Never Smoker  . Smokeless tobacco: Never Used  Substance and Sexual Activity  . Alcohol use: No    . Drug use: No  . Sexual activity: Not on file  Other Topics Concern  . Not on file  Social History Narrative  . Not on file    Current Outpatient Medications on File Prior to Visit  Medication Sig Dispense Refill  . ADVAIR DISKUS 250-50 MCG/DOSE AEPB INHALE 1 PUFF BY MOUTH ONCE DAILY 60 each 1  . atorvastatin (LIPITOR) 20 MG tablet TAKE ONE TABLET BY MOUTH ONCE DAILY (Patient taking differently: TAKE 20 MG BY MOUTH ONCE DAILY) 90 tablet 3  . Cholecalciferol (EQL VITAMIN D3) 2000 units CAPS Take 2,000 Units daily by mouth.    . diltiazem (CARDIZEM) 60 MG tablet TAKE 1 TABLET BY MOUTH TWICE DAILY 60 tablet 2  . fish oil-omega-3 fatty acids 1000 MG capsule Take 1 g by mouth daily.    Marland Kitchen losartan (COZAAR) 100 MG tablet TAKE ONE TABLET BY MOUTH ONCE DAILY (Patient taking differently: TAKE 100 MG BY MOUTH ONCE DAILY) 90 tablet 3  . Multiple Vitamin (MULTIVITAMIN) tablet Take 1 tablet by mouth daily.     . pioglitazone (ACTOS) 15 MG tablet TAKE ONE TABLET BY MOUTH ONCE DAILY (Patient taking differently: TAKE 15 MG BY MOUTH ONCE DAILY) 90 tablet 3  . sennosides-docusate sodium (SENOKOT-S) 8.6-50 MG tablet Take 2 tablets by  mouth daily. 30 tablet 1  . tizanidine (ZANAFLEX) 2 MG capsule Take 1 capsule (2 mg total) 3 (three) times daily as needed by mouth for muscle spasms. 90 capsule 2  . zafirlukast (ACCOLATE) 20 MG tablet TAKE ONE TABLET BY MOUTH TWICE DAILY (Patient taking differently: TAKE 20 MG BY MOUTH TWICE DAILY) 60 tablet 5   No current facility-administered medications on file prior to visit.     No Known Allergies  Family History  Problem Relation Age of Onset  . Cancer Brother        Lung Cancer  . Diabetes Mother   . Diabetes Father   . Cancer Daughter        Breast Cancer  . Colon cancer Neg Hx     BP 138/78 (BP Location: Left Arm, Patient Position: Sitting, Cuff Size: Normal)   Pulse (!) 118   Wt 253 lb 3.2 oz (114.9 kg)   SpO2 95%   BMI 36.33 kg/m   Review of  Systems Denies sob, wheezing, nasal congestion, and earache.      Objective:   Physical Exam VITAL SIGNS:  See vs page GENERAL: no distress head: no deformity  eyes: no periorbital swelling, no proptosis.  external nose and ears are normal  mouth: no lesion seen.  Both eac's and tm's are normal.   LUNGS:  Clear to auscultation.       Assessment & Plan:  Cough, new, prob allergic.  He declines CXR today.   Patient Instructions  I have sent a prescription to your pharmacy, for the cough syrup.  I hope you feel better soon.  If you don't feel better by next week, please call back, so we can check the chest x-ray.  Please call sooner if you get worse.

## 2017-02-17 DIAGNOSIS — R262 Difficulty in walking, not elsewhere classified: Secondary | ICD-10-CM | POA: Diagnosis not present

## 2017-02-17 DIAGNOSIS — M25662 Stiffness of left knee, not elsewhere classified: Secondary | ICD-10-CM | POA: Diagnosis not present

## 2017-02-17 DIAGNOSIS — M6281 Muscle weakness (generalized): Secondary | ICD-10-CM | POA: Diagnosis not present

## 2017-02-17 DIAGNOSIS — M25562 Pain in left knee: Secondary | ICD-10-CM | POA: Diagnosis not present

## 2017-02-21 ENCOUNTER — Telehealth: Payer: Self-pay | Admitting: Endocrinology

## 2017-02-21 ENCOUNTER — Other Ambulatory Visit: Payer: Self-pay

## 2017-02-21 MED ORDER — MONTELUKAST SODIUM 10 MG PO TABS: 10.0000 mg | ORAL_TABLET | Freq: Every day | ORAL | 3 refills | Status: DC

## 2017-02-21 NOTE — Telephone Encounter (Signed)
Patient is asking Is there another alternative for this medication zafirlukast (ACCOLATE) 20 MG tablet [[id number]] it is to expensive, he is totally out of medication.  Send to Pharmacy:  [place], [place].

## 2017-02-21 NOTE — Telephone Encounter (Signed)
Patient called insurance company & what they will cover is montelukast sodium. He was hoping to just be switched to that for cost purposes?

## 2017-02-21 NOTE — Telephone Encounter (Signed)
Ok, [facility]

## 2017-02-21 NOTE — Telephone Encounter (Signed)
I called and notified patient & [facility]

## 2017-02-21 NOTE — Telephone Encounter (Signed)
This is outside the scope of my practice.  Do you want to see a specialist?

## 2017-02-22 DIAGNOSIS — M6281 Muscle weakness (generalized): Secondary | ICD-10-CM | POA: Diagnosis not present

## 2017-02-22 DIAGNOSIS — M25662 Stiffness of left knee, not elsewhere classified: Secondary | ICD-10-CM | POA: Diagnosis not present

## 2017-02-22 DIAGNOSIS — R262 Difficulty in walking, not elsewhere classified: Secondary | ICD-10-CM | POA: Diagnosis not present

## 2017-02-22 DIAGNOSIS — M25562 Pain in left knee: Secondary | ICD-10-CM | POA: Diagnosis not present

## 2017-02-24 DIAGNOSIS — M6281 Muscle weakness (generalized): Secondary | ICD-10-CM | POA: Diagnosis not present

## 2017-02-24 DIAGNOSIS — M25562 Pain in left knee: Secondary | ICD-10-CM | POA: Diagnosis not present

## 2017-02-24 DIAGNOSIS — M25662 Stiffness of left knee, not elsewhere classified: Secondary | ICD-10-CM | POA: Diagnosis not present

## 2017-02-24 DIAGNOSIS — R262 Difficulty in walking, not elsewhere classified: Secondary | ICD-10-CM | POA: Diagnosis not present

## 2017-03-01 DIAGNOSIS — R262 Difficulty in walking, not elsewhere classified: Secondary | ICD-10-CM | POA: Diagnosis not present

## 2017-03-01 DIAGNOSIS — M25562 Pain in left knee: Secondary | ICD-10-CM | POA: Diagnosis not present

## 2017-03-01 DIAGNOSIS — M6281 Muscle weakness (generalized): Secondary | ICD-10-CM | POA: Diagnosis not present

## 2017-03-01 DIAGNOSIS — M25662 Stiffness of left knee, not elsewhere classified: Secondary | ICD-10-CM | POA: Diagnosis not present

## 2017-03-03 DIAGNOSIS — M6281 Muscle weakness (generalized): Secondary | ICD-10-CM | POA: Diagnosis not present

## 2017-03-03 DIAGNOSIS — M25662 Stiffness of left knee, not elsewhere classified: Secondary | ICD-10-CM | POA: Diagnosis not present

## 2017-03-03 DIAGNOSIS — M25562 Pain in left knee: Secondary | ICD-10-CM | POA: Diagnosis not present

## 2017-03-03 DIAGNOSIS — R262 Difficulty in walking, not elsewhere classified: Secondary | ICD-10-CM | POA: Diagnosis not present

## 2017-03-08 DIAGNOSIS — R262 Difficulty in walking, not elsewhere classified: Secondary | ICD-10-CM | POA: Diagnosis not present

## 2017-03-08 DIAGNOSIS — M25662 Stiffness of left knee, not elsewhere classified: Secondary | ICD-10-CM | POA: Diagnosis not present

## 2017-03-08 DIAGNOSIS — M6281 Muscle weakness (generalized): Secondary | ICD-10-CM | POA: Diagnosis not present

## 2017-03-08 DIAGNOSIS — M25562 Pain in left knee: Secondary | ICD-10-CM | POA: Diagnosis not present

## 2017-03-09 DIAGNOSIS — M25562 Pain in left knee: Secondary | ICD-10-CM | POA: Diagnosis not present

## 2017-04-04 DIAGNOSIS — M25562 Pain in left knee: Secondary | ICD-10-CM | POA: Diagnosis not present

## 2017-04-15 ENCOUNTER — Other Ambulatory Visit: Payer: Self-pay | Admitting: Endocrinology

## 2017-04-21 ENCOUNTER — Other Ambulatory Visit: Payer: Self-pay

## 2017-04-21 ENCOUNTER — Telehealth: Payer: Self-pay | Admitting: Endocrinology

## 2017-04-21 MED ORDER — FLUTICASONE-SALMETEROL 250-50 MCG/DOSE IN AEPB: INHALATION_SPRAY | RESPIRATORY_TRACT | 1 refills | Status: DC

## 2017-04-21 NOTE — Telephone Encounter (Signed)
LVM to patient stating that I had sent prescription to pharmacy eh requested.

## 2017-04-21 NOTE — Telephone Encounter (Signed)
Patient stated his pharmacy have not heard anything from our office concerning his medication ADVAIR DISKUS 250-50 MCG/DOSE AEPB. please   [place], [place]. [place]

## 2017-05-02 DIAGNOSIS — M25562 Pain in left knee: Secondary | ICD-10-CM | POA: Diagnosis not present

## 2017-06-13 DIAGNOSIS — M25562 Pain in left knee: Secondary | ICD-10-CM | POA: Diagnosis not present

## 2017-06-21 ENCOUNTER — Other Ambulatory Visit: Payer: Self-pay | Admitting: Endocrinology

## 2017-06-28 ENCOUNTER — Other Ambulatory Visit: Payer: Self-pay | Admitting: Endocrinology

## 2017-07-22 ENCOUNTER — Other Ambulatory Visit: Payer: Self-pay | Admitting: Endocrinology

## 2017-08-09 ENCOUNTER — Other Ambulatory Visit: Payer: Self-pay | Admitting: Endocrinology

## 2017-08-15 DIAGNOSIS — M25562 Pain in left knee: Secondary | ICD-10-CM | POA: Diagnosis not present

## 2017-08-18 DIAGNOSIS — E119 Type 2 diabetes mellitus without complications: Secondary | ICD-10-CM | POA: Diagnosis not present

## 2017-08-18 DIAGNOSIS — H52222 Regular astigmatism, left eye: Secondary | ICD-10-CM | POA: Diagnosis not present

## 2017-08-18 DIAGNOSIS — Z961 Presence of intraocular lens: Secondary | ICD-10-CM | POA: Diagnosis not present

## 2017-08-18 DIAGNOSIS — H52211 Irregular astigmatism, right eye: Secondary | ICD-10-CM | POA: Diagnosis not present

## 2017-08-18 DIAGNOSIS — H524 Presbyopia: Secondary | ICD-10-CM | POA: Diagnosis not present

## 2017-09-01 ENCOUNTER — Other Ambulatory Visit: Payer: Self-pay | Admitting: Endocrinology

## 2017-10-17 DIAGNOSIS — M25562 Pain in left knee: Secondary | ICD-10-CM | POA: Diagnosis not present

## 2017-11-01 ENCOUNTER — Encounter: Payer: Self-pay | Admitting: Endocrinology

## 2017-11-01 ENCOUNTER — Ambulatory Visit (INDEPENDENT_AMBULATORY_CARE_PROVIDER_SITE_OTHER): Payer: PPO | Admitting: Endocrinology

## 2017-11-01 VITALS — BP 130/72 | HR 94 | Ht 70.0 in | Wt 254.4 lb

## 2017-11-01 DIAGNOSIS — Z23 Encounter for immunization: Secondary | ICD-10-CM

## 2017-11-01 DIAGNOSIS — Z0000 Encounter for general adult medical examination without abnormal findings: Secondary | ICD-10-CM

## 2017-11-01 DIAGNOSIS — Z125 Encounter for screening for malignant neoplasm of prostate: Secondary | ICD-10-CM | POA: Diagnosis not present

## 2017-11-01 DIAGNOSIS — E119 Type 2 diabetes mellitus without complications: Secondary | ICD-10-CM

## 2017-11-01 LAB — POCT GLYCOSYLATED HEMOGLOBIN (HGB A1C): HEMOGLOBIN A1C: 6 % — AB (ref 4.0–5.6)

## 2017-11-01 LAB — LIPID PANEL
CHOLESTEROL: 116 mg/dL (ref 0–200)
HDL: 39 mg/dL — AB (ref >39.00)
LDL Cholesterol: 62 mg/dL (ref 0–99)
NONHDL: 76.72
TRIGLYCERIDES: 74 mg/dL (ref 0.0–149.0)
Total CHOL/HDL Ratio: 3
VLDL: 14.8 mg/dL (ref 0.0–40.0)

## 2017-11-01 LAB — CBC WITH DIFFERENTIAL/PLATELET
BASOS ABS: 0 10*3/uL (ref 0.0–0.1)
Basophils Relative: 0.8 % (ref 0.0–3.0)
Eosinophils Absolute: 0.2 10*3/uL (ref 0.0–0.7)
Eosinophils Relative: 3.4 % (ref 0.0–5.0)
HCT: 38.4 % — ABNORMAL LOW (ref 39.0–52.0)
Hemoglobin: 12.9 g/dL — ABNORMAL LOW (ref 13.0–17.0)
LYMPHS ABS: 1.8 10*3/uL (ref 0.7–4.0)
Lymphocytes Relative: 33.9 % (ref 12.0–46.0)
MCHC: 33.5 g/dL (ref 30.0–36.0)
MCV: 96.7 fl (ref 78.0–100.0)
MONOS PCT: 14.7 % — AB (ref 3.0–12.0)
Monocytes Absolute: 0.8 10*3/uL (ref 0.1–1.0)
NEUTROS PCT: 47.2 % (ref 43.0–77.0)
Neutro Abs: 2.6 10*3/uL (ref 1.4–7.7)
Platelets: 321 10*3/uL (ref 150.0–400.0)
RBC: 3.97 Mil/uL — AB (ref 4.22–5.81)
RDW: 13.5 % (ref 11.5–15.5)
WBC: 5.4 10*3/uL (ref 4.0–10.5)

## 2017-11-01 LAB — URINALYSIS, ROUTINE W REFLEX MICROSCOPIC
BILIRUBIN URINE: NEGATIVE
HGB URINE DIPSTICK: NEGATIVE
Ketones, ur: NEGATIVE
LEUKOCYTES UA: NEGATIVE
NITRITE: NEGATIVE
RBC / HPF: NONE SEEN (ref 0–?)
Specific Gravity, Urine: 1.025 (ref 1.000–1.030)
TOTAL PROTEIN, URINE-UPE24: NEGATIVE
Urine Glucose: NEGATIVE
Urobilinogen, UA: 0.2 (ref 0.0–1.0)
pH: 6 (ref 5.0–8.0)

## 2017-11-01 LAB — BASIC METABOLIC PANEL
BUN: 15 mg/dL (ref 6–23)
CO2: 28 meq/L (ref 19–32)
Calcium: 8.8 mg/dL (ref 8.4–10.5)
Chloride: 105 mEq/L (ref 96–112)
Creatinine, Ser: 0.99 mg/dL (ref 0.40–1.50)
GFR: 79.97 mL/min (ref >60.00)
Glucose, Bld: 115 mg/dL — ABNORMAL HIGH (ref 70–99)
Potassium: 4.1 mEq/L (ref 3.5–5.1)
SODIUM: 139 meq/L (ref 135–145)

## 2017-11-01 LAB — TSH: TSH: 3.89 u[IU]/mL (ref 0.35–4.50)

## 2017-11-01 LAB — PSA: PSA: 0.62 ng/mL (ref 0.10–4.00)

## 2017-11-01 LAB — MICROALBUMIN / CREATININE URINE RATIO
CREATININE, U: 227.5 mg/dL
MICROALB/CREAT RATIO: 0.4 mg/g (ref 0.0–30.0)
Microalb, Ur: 1 mg/dL (ref 0.0–1.9)

## 2017-11-01 LAB — HEPATIC FUNCTION PANEL
ALBUMIN: 4 g/dL (ref 3.5–5.2)
ALK PHOS: 86 U/L (ref 39–117)
ALT: 18 U/L (ref 0–53)
AST: 16 U/L (ref 0–37)
BILIRUBIN TOTAL: 0.4 mg/dL (ref 0.2–1.2)
Bilirubin, Direct: 0.1 mg/dL (ref 0.0–0.3)
Total Protein: 6.8 g/dL (ref 6.0–8.3)

## 2017-11-01 NOTE — Progress Notes (Signed)
Subjective:    Patient ID: Edward Hawkins, male    DOB: 1950-03-01, 67 y.o.   MRN: [id number]  HPI Pt is here for regular wellness examination, and is feeling pretty well in general, and says chronic med probs are stable, except as noted below Past Medical History:  Diagnosis Date  . Arthritis   . ASTHMA 10/08/2006  . Asthma    last flareup more than 10 yrs ago.  . Cataract    extractions  . Colon polyps    tubular adenomas  . CVA 10/10/2005  . DIABETES MELLITUS, TYPE II 10/08/2006  . Diverticulosis   . HYPERLIPIDEMIA 10/08/2006  . HYPERTENSION 10/08/2006  . Primary localized osteoarthritis of left knee 12/28/2016  . RECTAL BLEEDING 09/15/2009  . S/P LASIK surgery of both eyes   . Seasonal allergies     Past Surgical History:  Procedure Laterality Date  . CATARACT EXTRACTION, BILATERAL    . COLONOSCOPY    . EYE SURGERY    . FRACTURE SURGERY    . KNEE SURGERY     left knee x2  . PARTIAL KNEE ARTHROPLASTY Left 12/28/2016   Procedure: LEFT UNICOMPARTMENTAL KNEE;  Surgeon: Marchia Bond, MD;  Location: [facility];  Service: Orthopedics;  Laterality: Left;  . RADIOLOGY WITH ANESTHESIA N/A 10/28/2016   Procedure: MRI - CERVICAL SPINE WITHOUT;  Surgeon: Radiologist, Medication, MD;  Location: [facility];  Service: Radiology;  Laterality: N/A;  . Stress Cardiolite  11/05/2005  . TRANSTHORACIC ECHOCARDIOGRAM  10/12/2005    Social History   Socioeconomic History  . Marital status: Single    Spouse name: Not on file  . Number of children: Not on file  . Years of education: Not on file  . Highest education level: Not on file  Occupational History  . Occupation: Works at [employer]  . Financial resource strain: Not on file  . Food insecurity:    Worry: Not on file    Inability: Not on file  . Transportation needs:    Medical: Not on file    Non-medical: Not on file  Tobacco Use  . Smoking status: Never Smoker  . Smokeless tobacco: Never Used  Substance and Sexual  Activity  . Alcohol use: No  . Drug use: No  . Sexual activity: Not on file  Lifestyle  . Physical activity:    Days per week: Not on file    Minutes per session: Not on file  . Stress: Not on file  Relationships  . Social connections:    Talks on phone: Not on file    Gets together: Not on file    Attends religious service: Not on file    Active member of club or organization: Not on file    Attends meetings of clubs or organizations: Not on file    Relationship status: Not on file  . Intimate partner violence:    Fear of current or ex partner: Not on file    Emotionally abused: Not on file    Physically abused: Not on file    Forced sexual activity: Not on file  Other Topics Concern  . Not on file  Social History Narrative  . Not on file    Current Outpatient Medications on File Prior to Visit  Medication Sig Dispense Refill  . ADVAIR DISKUS 250-50 MCG/DOSE AEPB INHALE 1 DOSE BY MOUTH ONCE DAILY 60 each 1  . atorvastatin (LIPITOR) 20 MG tablet TAKE 1 TABLET BY MOUTH ONCE DAILY 90 tablet  3  . Cholecalciferol (EQL VITAMIN D3) 2000 units CAPS Take 2,000 Units daily by mouth.    . diltiazem (CARDIZEM) 60 MG tablet TAKE 1 TABLET BY MOUTH TWICE DAILY 60 tablet 2  . fish oil-omega-3 fatty acids 1000 MG capsule Take 1 g by mouth daily.    Marland Kitchen losartan (COZAAR) 100 MG tablet TAKE 1 TABLET BY MOUTH ONCE DAILY 90 tablet 3  . montelukast (SINGULAIR) 10 MG tablet Take 1 tablet (10 mg total) by mouth at bedtime. 90 tablet 3  . Multiple Vitamin (MULTIVITAMIN) tablet Take 1 tablet by mouth daily.     . pioglitazone (ACTOS) 15 MG tablet TAKE 1 TABLET BY MOUTH ONCE DAILY 90 tablet 3  . promethazine-codeine (PHENERGAN WITH CODEINE) 6.25-10 MG/5ML syrup Take 5 mLs by mouth every 4 (four) hours as needed. 120 mL 0  . sennosides-docusate sodium (SENOKOT-S) 8.6-50 MG tablet Take 2 tablets by mouth daily. 30 tablet 1  . tizanidine (ZANAFLEX) 2 MG capsule Take 1 capsule (2 mg total) 3 (three) times  daily as needed by mouth for muscle spasms. 90 capsule 2  . traMADol (ULTRAM) 50 MG tablet Take 50 mg by mouth 3 (three) times daily as needed.  0   No current facility-administered medications on file prior to visit.     No Known Allergies  Family History  Problem Relation Age of Onset  . Cancer Brother        Lung Cancer  . Diabetes Mother   . Diabetes Father   . Cancer Daughter        Breast Cancer  . Colon cancer Neg Hx     BP 130/72 (BP Location: Left Arm)   Pulse 94   Ht 5\' 10"  (1.778 m)   Wt 254 lb 6.4 oz (115.4 kg)   SpO2 97%   BMI 36.50 kg/m    Review of Systems Denies fever, fatigue, diplopia, earache, chest pain, sob, insomnia, cold intolerance, BRBPR, hematuria, syncope, numbness, allergy sxs, easy bruising, and rash.  No change in chronic low-back pain.       Objective:   Physical Exam VS: see vs page GEN: no distress HEAD: head: no deformity eyes: no periorbital swelling, no proptosis external nose and ears are normal mouth: no lesion seen NECK: supple, thyroid is not enlarged CHEST WALL: no deformity LUNGS: clear to auscultation CV: reg rate and rhythm, no murmur ABD: abdomen is soft, nontender.  no hepatosplenomegaly.  not distended.  no hernia MUSCULOSKELETAL: muscle bulk and strength are grossly normal.  no obvious joint swelling.  gait is normal and steady.  Old healed surgical scar at the left knee.   EXTEMITIES: no deformity.  no ulcer on the feet.  feet are of normal color and temp.  Trace bilat leg edema. Ecchymosis under the right great toenail.  PULSES: dorsalis pedis intact bilat.  no carotid bruit NEURO:  cn 2-12 grossly intact.   readily moves all 4's.  sensation is intact to touch on the feet SKIN:  Normal texture and temperature.  No rash or suspicious lesion is visible.   NODES:  None palpable at the neck PSYCH: alert, well-oriented.  Does not appear anxious nor depressed.  I personally reviewed electrocardiogram tracing  (today): Indication: DM Impression: NSR.  No MI.  No hypertrophy. Compared to 2018: no significant change       Assessment & Plan:  Wellness visit today, with problems stable, except as noted.   Subjective:   Patient here for Medicare annual wellness  visit and management of other chronic and acute problems.     Risk factors: advanced age    5 of Physicians Providing Medical Care to Patient:  See "snapshot"   Activities of Daily Living: In your present state of health, do you have any difficulty performing the following activities (lives with GF)?:  Preparing food and eating?: No  Bathing yourself: No  Getting dressed: No  Using the toilet: No  Moving around from place to place: No  In the past year have you fallen or had a near fall?: No    Home Safety: Has smoke detector and wears seat belts. No firearms. No excess sun exposure.    Opioid Use: none  Diet and Exercise  Current exercise habits: pt says good.  Dietary issues discussed: pt reports a healthy diet   Depression Screen  Q1: Over the past two weeks, have you felt down, depressed or hopeless?no  Q2: Over the past two weeks, have you felt little interest or pleasure in doing things? no   The following portions of the patient's history were reviewed and updated as appropriate: allergies, current medications, past family history, past medical history, past social history, past surgical history and problem list.   Review of Systems  Denies hearing loss, and visual loss Objective:   Vision:  See [facility] done today Hearing: grossly normal Body mass index:  See vs page Msk: pt easily and quickly performs "get-up-and-go" from a sitting position Cognitive Impairment Assessment: cognition, memory and judgment appear normal.  remembers 3/3 at 5 minutes.  excellent recall.  can easily read and write a sentence.  alert and oriented x 3.     Assessment:   Medicare wellness utd on preventive parameters    Plan:    During the course of the visit the patient was educated and counseled about appropriate screening and preventive services including:        Fall prevention is advised today  Diabetes screening is UTD Nutrition counseling is offered  advanced directives/end of life addressed today:  see healthcare directives hyperlink.   Vaccines are updated as needed  Patient Instructions (the written plan) was given to the patient.   Patient Instructions  Please consider these measures for your health:  minimize alcohol.  Do not use tobacco products.  Have a colonoscopy at least every 10 years from age 35.  Keep firearms safely stored.  Always use seat belts.  have working smoke alarms in your home.  See an eye doctor and dentist regularly.  Never drive under the influence of alcohol or drugs (including prescription drugs).  Those with fair skin should take precautions against the sun, and should carefully examine their skin once per month, for any new or changed moles. It is critically important to prevent falling down (keep floor areas well-lit, dry, and free of loose objects.  If you have a cane, walker, or wheelchair, you should use it, even for short trips around the house.  Wear flat-soled shoes.  Also, try not to rush) blood tests are requested for you today.  We'll let you know about the results.  Best wishes with your new primary care provider

## 2017-11-01 NOTE — Progress Notes (Signed)
we discussed code status.  pt requests DNR 

## 2017-11-01 NOTE — Patient Instructions (Addendum)
Please consider these measures for your health:  minimize alcohol.  Do not use tobacco products.  Have a colonoscopy at least every 10 years from age 67.  Keep firearms safely stored.  Always use seat belts.  have working smoke alarms in your home.  See an eye doctor and dentist regularly.  Never drive under the influence of alcohol or drugs (including prescription drugs).  Those with fair skin should take precautions against the sun, and should carefully examine their skin once per month, for any new or changed moles. It is critically important to prevent falling down (keep floor areas well-lit, dry, and free of loose objects.  If you have a cane, walker, or wheelchair, you should use it, even for short trips around the house.  Wear flat-soled shoes.  Also, try not to rush) blood tests are requested for you today.  We'll let you know about the results.  Best wishes with your new primary care provider

## 2017-11-15 ENCOUNTER — Other Ambulatory Visit: Payer: Self-pay | Admitting: Endocrinology

## 2018-01-16 DIAGNOSIS — M25562 Pain in left knee: Secondary | ICD-10-CM | POA: Diagnosis not present

## 2018-01-30 ENCOUNTER — Ambulatory Visit (INDEPENDENT_AMBULATORY_CARE_PROVIDER_SITE_OTHER): Payer: PPO | Admitting: Nurse Practitioner

## 2018-01-30 ENCOUNTER — Encounter: Payer: Self-pay | Admitting: Nurse Practitioner

## 2018-01-30 VITALS — BP 140/82 | HR 89 | Ht 71.0 in | Wt 254.0 lb

## 2018-01-30 DIAGNOSIS — G8929 Other chronic pain: Secondary | ICD-10-CM | POA: Diagnosis not present

## 2018-01-30 DIAGNOSIS — E785 Hyperlipidemia, unspecified: Secondary | ICD-10-CM

## 2018-01-30 DIAGNOSIS — J4520 Mild intermittent asthma, uncomplicated: Secondary | ICD-10-CM | POA: Diagnosis not present

## 2018-01-30 DIAGNOSIS — I6350 Cerebral infarction due to unspecified occlusion or stenosis of unspecified cerebral artery: Secondary | ICD-10-CM | POA: Diagnosis not present

## 2018-01-30 DIAGNOSIS — I10 Essential (primary) hypertension: Secondary | ICD-10-CM

## 2018-01-30 DIAGNOSIS — M25562 Pain in left knee: Secondary | ICD-10-CM

## 2018-01-30 DIAGNOSIS — E119 Type 2 diabetes mellitus without complications: Secondary | ICD-10-CM

## 2018-01-30 MED ORDER — ADVAIR DISKUS 250-50 MCG/DOSE IN AEPB: INHALATION_SPRAY | RESPIRATORY_TRACT | 1 refills | Status: DC

## 2018-01-30 MED ORDER — TRAMADOL HCL 50 MG PO TABS: 50.0000 mg | ORAL_TABLET | Freq: Every evening | ORAL | 0 refills | Status: DC | PRN

## 2018-01-30 NOTE — Assessment & Plan Note (Signed)
Stable Continue current medication A1c is up to date RTC in 3 months for routine follow up- a1c

## 2018-01-30 NOTE — Assessment & Plan Note (Signed)
Stable Continue current medications Continue to monitor A1c, lipids, BP for stroke prevention

## 2018-01-30 NOTE — Assessment & Plan Note (Signed)
Stable Continue current medications F/u for new, worsening symptoms - ADVAIR DISKUS 250-50 MCG/DOSE AEPB; INHALE 1 DOSE BY MOUTH ONCE DAILY  Dispense: 60 each; Refill: 1

## 2018-01-30 NOTE — Progress Notes (Signed)
Edward Hawkins is a 67 y.o. male with the following history as recorded in EpicCare:  Patient Active Problem List   Diagnosis Date Noted  . Primary localized osteoarthritis of left knee 12/28/2016  . S/P knee replacement 12/28/2016  . Back pain 12/11/2016  . Anemia 11/01/2016  . Hypocalcemia 11/01/2016  . Fever 03/31/2016  . Preop examination 05/17/2014  . Screening for prostate cancer 09/29/2012  . Routine general medical examination at a health care facility 09/28/2010  . Knee pain, left 09/28/2010  . Cerebral artery occlusion with cerebral infarction ([place]) 11/11/2009  . RECTAL BLEEDING 09/15/2009  . Diabetes ([place]) 10/08/2006  . Dyslipidemia 10/08/2006  . HTN (hypertension) 10/08/2006  . Asthma 10/08/2006    Current Outpatient Medications  Medication Sig Dispense Refill  . ADVAIR DISKUS 250-50 MCG/DOSE AEPB INHALE 1 DOSE BY MOUTH ONCE DAILY 60 each 1  . atorvastatin (LIPITOR) 20 MG tablet TAKE 1 TABLET BY MOUTH ONCE DAILY 90 tablet 3  . Cholecalciferol (EQL VITAMIN D3) 2000 units CAPS Take 2,000 Units daily by mouth.    . diltiazem (CARDIZEM) 60 MG tablet TAKE 1 TABLET BY MOUTH TWICE DAILY 60 tablet 2  . fish oil-omega-3 fatty acids 1000 MG capsule Take 1 g by mouth daily.    Marland Kitchen losartan (COZAAR) 100 MG tablet TAKE 1 TABLET BY MOUTH ONCE DAILY 90 tablet 3  . montelukast (SINGULAIR) 10 MG tablet Take 1 tablet (10 mg total) by mouth at bedtime. 90 tablet 3  . Multiple Vitamin (MULTIVITAMIN) tablet Take 1 tablet by mouth daily.     . pioglitazone (ACTOS) 15 MG tablet TAKE 1 TABLET BY MOUTH ONCE DAILY 90 tablet 3  . promethazine-codeine (PHENERGAN WITH CODEINE) 6.25-10 MG/5ML syrup Take 5 mLs by mouth every 4 (four) hours as needed. 120 mL 0  . sennosides-docusate sodium (SENOKOT-S) 8.6-50 MG tablet Take 2 tablets by mouth daily. 30 tablet 1  . tizanidine (ZANAFLEX) 2 MG capsule Take 1 capsule (2 mg total) 3 (three) times daily as needed by mouth for muscle spasms. 90 capsule 2   . traMADol (ULTRAM) 50 MG tablet Take 1 tablet (50 mg total) by mouth at bedtime as needed. 30 tablet 0   No current facility-administered medications for this visit.     Allergies: Patient has no known allergies.  Past Medical History:  Diagnosis Date  . Arthritis   . ASTHMA 10/08/2006  . Asthma    last flareup more than 10 yrs ago.  . Cataract    extractions  . Colon polyps    tubular adenomas  . CVA 10/10/2005  . DIABETES MELLITUS, TYPE II 10/08/2006  . Diverticulosis   . HYPERLIPIDEMIA 10/08/2006  . HYPERTENSION 10/08/2006  . Primary localized osteoarthritis of left knee 12/28/2016  . RECTAL BLEEDING 09/15/2009  . S/P LASIK surgery of both eyes   . Seasonal allergies     Past Surgical History:  Procedure Laterality Date  . CATARACT EXTRACTION, BILATERAL    . COLONOSCOPY    . EYE SURGERY    . FRACTURE SURGERY    . KNEE SURGERY     left knee x2  . PARTIAL KNEE ARTHROPLASTY Left 12/28/2016   Procedure: LEFT UNICOMPARTMENTAL KNEE;  Surgeon: Marchia Bond, MD;  Location: [facility];  Service: Orthopedics;  Laterality: Left;  . RADIOLOGY WITH ANESTHESIA N/A 10/28/2016   Procedure: MRI - CERVICAL SPINE WITHOUT;  Surgeon: Radiologist, Medication, MD;  Location: [facility];  Service: Radiology;  Laterality: N/A;  . Stress Cardiolite  11/05/2005  .  TRANSTHORACIC ECHOCARDIOGRAM  10/12/2005    Family History  Problem Relation Age of Onset  . Cancer Brother        Lung Cancer  . Diabetes Mother   . Diabetes Father   . Cancer Daughter        Breast Cancer  . Colon cancer Neg Hx     Social History   Tobacco Use  . Smoking status: Never Smoker  . Smokeless tobacco: Never Used  Substance Use Topics  . Alcohol use: No     Subjective:  Edward Hawkins  is here today to establish care as a new patient to our practice, transferring from prior provider Dr Loanne Drilling, endocrinology, who was providing the patient with primary care needs until now  Aside from primary care, he is not routinely  followed by any specialists. We will review medications/chronic conditions managed by prior provider today  Diabetes- maintained on actos 15 Reports daily medication compliance without noted adverse medication effects. Denies tremor, diaphoresis, polyuria, polydipsia, polyphagia.  Lab Results  Component Value Date   HGBA1C 6.0 (A) 11/01/2017    Hypertension -maintained on losartan 100, cardizem 60 BID Reports daily medication compliance without noted adverse medication effects. Denies headaches, vision changes, chest pain, shortness of breath, edema.  BP Readings from Last 3 Encounters:  01/30/18 140/82  11/01/17 130/72  02/16/17 138/78   HLD- maintained on atorvastatin 20, fish oil. Reports daily medication compliance without adverse medication effects. He has history of stroke, about 10 years ago- also maintained on daily ASA.  Lab Results  Component Value Date   CHOL 116 11/01/2017   HDL 39.00 (L) 11/01/2017   LDLCALC 62 11/01/2017   TRIG 74.0 11/01/2017   CHOLHDL 3 11/01/2017   Asthma- stable for many years on Advair and singulair daily, started on these medications by pulmonology years ago, denies any recent flares, has not used albuterol in years. Uses advair and singulair daily as prescribed No fevers, chills, cough, wheezing.  Knee pain- Chronic left knee pain Was followed by Dr Jerrye Bushy, ortho, for this in the past but his knee pain has been stable and would like for me to continue his tramadol 50 once daily at bedtime, which he has been maintained on with adequate control of pain and no noted adverse medication effects, would like to continue this medication for his pain  ROS- See HPI  Objective:  Vitals:   01/30/18 1551  BP: 140/82  Pulse: 89  SpO2: 95%  Weight: 254 lb (115.2 kg)  Height: 5\' 11"  (1.803 m)    General: Well developed, well nourished, in no acute distress  Skin : Warm and dry.  Head: Normocephalic and atraumatic  Eyes: Sclera and  conjunctiva clear; pupils round and reactive to light; extraocular movements intact  Oropharynx: Pink, supple. No suspicious lesions  Neck: Supple Lungs: Respirations unlabored; clear to auscultation bilaterally without wheeze, rales, rhonchi  CVS exam: normal rate and regular rhythm, S1 and S2 normal.  Musculoskeletal: No deformities; no active joint inflammation  Extremities: No edema, cyanosis, clubbing  Vessels: Symmetric bilaterally  Neurologic: Alert and oriented; speech intact; face symmetrical; moves all extremities well; CNII-XII intact without focal deficit  Psychiatric: Normal mood and affect.  Assessment:  1. Essential hypertension   2. Cerebral artery occlusion with cerebral infarction ([place])   3. Type 2 diabetes mellitus without complication, without long-term current use of insulin ([place])   4. Dyslipidemia   5. Mild intermittent asthma without complication   6. Chronic pain of  left knee     Plan:  11/01/17- TSH, BMET, lipid panel, UA, CBC w/diff, hepatic function panel, PSA, A1c WNL  Return in about 3 months (around 05/01/2018) for F/u- dm- a1c.  No orders of the defined types were placed in this encounter.   Requested Prescriptions   Signed Prescriptions Disp Refills  . ADVAIR DISKUS 250-50 MCG/DOSE AEPB 60 each 1    Sig: INHALE 1 DOSE BY MOUTH ONCE DAILY  . traMADol (ULTRAM) 50 MG tablet 30 tablet 0    Sig: Take 1 tablet (50 mg total) by mouth at bedtime as needed.

## 2018-01-30 NOTE — Assessment & Plan Note (Signed)
Continue tramadol at current dosage only If increased dosages are needed we will consider further testing, referral back to sports med Controlled substance registry reviewed with no irregularities. F/u for new, worsening symptoms - traMADol (ULTRAM) 50 MG tablet; Take 1 tablet (50 mg total) by mouth at bedtime as needed.  Dispense: 30 tablet; Refill: 0

## 2018-01-30 NOTE — Patient Instructions (Signed)
I will see you in 3 months for diabetes follow up, sooner if needed  It was very nice to meet you!

## 2018-01-30 NOTE — Assessment & Plan Note (Addendum)
Stable, on higher end of normal today Continue current medications  Continue to monitor RTC in 3 months for F/U-reheck BP Due to history of stroke, tight BP control is important, will consider medication adjustments if BP remains elevated at follow up

## 2018-01-30 NOTE — Assessment & Plan Note (Signed)
Stable Continue current medications Lipid panel up to date with LDL <70 on 11/01/17 Next lipid panel due 10/2018

## 2018-02-21 ENCOUNTER — Other Ambulatory Visit: Payer: Self-pay | Admitting: Nurse Practitioner

## 2018-02-21 MED ORDER — DILTIAZEM HCL 60 MG PO TABS: 60.0000 mg | ORAL_TABLET | Freq: Two times a day (BID) | ORAL | 0 refills | Status: DC

## 2018-02-21 NOTE — Telephone Encounter (Signed)
Requested medication (s) are due for refill today: yes  Requested medication (s) are on the active medication list: yes  Last refill:  Last refilled by a different provider  Future visit scheduled: yes  Notes to clinic:  Unable to refill per protocol. Medication last filled by a different provider    Requested Prescriptions  Pending Prescriptions Disp Refills   diltiazem (CARDIZEM) 60 MG tablet 60 tablet 2    Sig: Take 1 tablet (60 mg total) by mouth 2 (two) times daily.     Off-Protocol Failed - 02/21/2018 11:05 AM      Failed - Medication not assigned to a protocol, review manually.      Passed - Valid encounter within last 12 months    Recent Outpatient Visits          3 weeks ago Essential hypertension   [place], Delphia Grates, NP   6 years ago DIABETES MELLITUS, [facility] -[facility] Lambert, Hilliard Clark, MD   7 years ago Type II or unspecified type diabetes mellitus without mention of complication, not stated as uncontrolled   [place] Ellison, Sean, MD      Future Appointments            In 2 months [place], Delphia Grates, NP [place], [place]   In 8 months [place], Delphia Grates, NP [place], [place]

## 2018-02-21 NOTE — Telephone Encounter (Signed)
Copied from [place] [id number]. Topic: Quick Communication - Rx Refill/Question >> Feb 21, 2018 10:57 AM Leward Quan A wrote: Medication: diltiazem (CARDIZEM) 60 MG tablet  Has the patient contacted their pharmacy? Yes.   (Agent: If no, request that the patient contact the pharmacy for the refill.) (Agent: If yes, when and what did the pharmacy advise?)  Preferred Pharmacy (with phone number or street name): [place], [place]. [phone] (Phone) [phone] (Fax)    Agent: Please be advised that RX refills may take up to 3 business days. We ask that you follow-up with your pharmacy.

## 2018-02-28 ENCOUNTER — Telehealth: Payer: Self-pay | Admitting: Nurse Practitioner

## 2018-02-28 DIAGNOSIS — M25562 Pain in left knee: Principal | ICD-10-CM

## 2018-02-28 DIAGNOSIS — G8929 Other chronic pain: Secondary | ICD-10-CM

## 2018-02-28 NOTE — Telephone Encounter (Signed)
Copied from [place] [id number]. Topic: Quick Communication - Rx Refill/Question >> Feb 28, 2018  2:06 PM Alfredia Ferguson R wrote: Medication:traMADol (ULTRAM) 50 MG tablet  Has the patient contacted their pharmacy? Yes  Preferred Pharmacy (with phone number or street name): [place], [place]. [phone] (Phone) [phone] (Fax)    Agent: Please be advised that RX refills may take up to 3 business days. We ask that you follow-up with your pharmacy.

## 2018-02-28 NOTE — Telephone Encounter (Signed)
Medication not delegated for NT to refill. 

## 2018-03-01 MED ORDER — TRAMADOL HCL 50 MG PO TABS: 50.0000 mg | ORAL_TABLET | Freq: Every evening | ORAL | 0 refills | Status: DC | PRN

## 2018-03-01 NOTE — Telephone Encounter (Signed)
Tramadol refill sent 

## 2018-03-01 NOTE — Telephone Encounter (Signed)
Called pt to advise refill sent. No answer/vm.

## 2018-03-01 NOTE — Addendum Note (Signed)
Addended by: Lance Sell on: 03/01/2018 12:52 PM   Modules accepted: Orders

## 2018-03-29 ENCOUNTER — Telehealth: Payer: Self-pay | Admitting: Nurse Practitioner

## 2018-03-29 DIAGNOSIS — M25562 Pain in left knee: Principal | ICD-10-CM

## 2018-03-29 DIAGNOSIS — G8929 Other chronic pain: Secondary | ICD-10-CM

## 2018-03-29 MED ORDER — TRAMADOL HCL 50 MG PO TABS: 50.0000 mg | ORAL_TABLET | Freq: Every evening | ORAL | 0 refills | Status: DC | PRN

## 2018-03-29 NOTE — Telephone Encounter (Signed)
Requested medication (s) are due for refill today: yes  Requested medication (s) are on the active medication list: yes    Last refill: 03/01/2018 #30 0 refills  Future visit scheduled YEs  05/01/2018 A. [place]  Notes to clinic:Not delegated  Requested Prescriptions  Pending Prescriptions Disp Refills   traMADol (ULTRAM) 50 MG tablet 30 tablet 0    Sig: Take 1 tablet (50 mg total) by mouth at bedtime as needed.     Not Delegated - Analgesics:  Opioid Agonists Failed - 03/29/2018  1:32 PM      Failed - This refill cannot be delegated      Failed - Urine Drug Screen completed in last 360 days.      Passed - Valid encounter within last 6 months    Recent Outpatient Visits          1 month ago Essential hypertension   [place], Delphia Grates, NP   6 years ago DIABETES MELLITUS, [facility] -[facility] Lambert, Hilliard Clark, MD   7 years ago Type II or unspecified type diabetes mellitus without mention of complication, not stated as uncontrolled   [place] Ellison, Sean, MD      Future Appointments            In 1 month [place], Delphia Grates, NP [place], [place]   In 7 months [place], Delphia Grates, NP [place], [place]

## 2018-03-29 NOTE — Telephone Encounter (Signed)
Copied from [place] [id number]. Topic: Quick Communication - Rx Refill/Question >> Mar 29, 2018  1:28 PM Oneta Rack wrote:  Medication: traMADol (ULTRAM) 50 MG tablet, montelukast (SINGULAIR) 10 MG tablet   Has the patient contacted their pharmacy? Yes   Preferred Pharmacy (with phone number or street name):  [place], [place]. [phone] (Phone) [phone] (Fax)    Agent: Please be advised that RX refills may take up to 3 business days. We ask that you follow-up with your pharmacy.

## 2018-03-29 NOTE — Telephone Encounter (Signed)
[place] Controlled Database Checked Last filled: 03/01/18 # 30 LOV w/you: 01/30/18 Next appt w/you: 05/01/18

## 2018-03-31 MED ORDER — MONTELUKAST SODIUM 10 MG PO TABS: 10.0000 mg | ORAL_TABLET | Freq: Every day | ORAL | 2 refills | Status: DC

## 2018-03-31 NOTE — Telephone Encounter (Signed)
Patient called stating that he has picked up tramadol but pharmacy advised him that the Montelukast was never sent in. Patient has enough tablets to last until Monday. Please advise.

## 2018-03-31 NOTE — Addendum Note (Signed)
Addended by: Cresenciano Lick on: 03/31/2018 04:53 PM   Modules accepted: Orders

## 2018-03-31 NOTE — Telephone Encounter (Signed)
Montelukast 10 mg refill sent. See meds.

## 2018-04-17 DIAGNOSIS — M1712 Unilateral primary osteoarthritis, left knee: Secondary | ICD-10-CM | POA: Diagnosis not present

## 2018-04-17 DIAGNOSIS — M542 Cervicalgia: Secondary | ICD-10-CM | POA: Diagnosis not present

## 2018-04-24 ENCOUNTER — Other Ambulatory Visit: Payer: Self-pay | Admitting: Nurse Practitioner

## 2018-04-24 DIAGNOSIS — M25562 Pain in left knee: Principal | ICD-10-CM

## 2018-04-24 DIAGNOSIS — G8929 Other chronic pain: Secondary | ICD-10-CM

## 2018-04-24 MED ORDER — TRAMADOL HCL 50 MG PO TABS: 50.0000 mg | ORAL_TABLET | Freq: Every evening | ORAL | 0 refills | Status: DC | PRN

## 2018-04-24 NOTE — Telephone Encounter (Signed)
Copied from [place] [id number]. Topic: Quick Communication - Rx Refill/Question >> Apr 24, 2018  9:27 AM Blase Mess A wrote: Medication: traMADol (ULTRAM) 50 MG tablet [[id number]]   Has the patient contacted their pharmacy? Yes  (Agent: If no, request that the patient contact the pharmacy for the refill.) (Agent: If yes, when and what did the pharmacy advise?)  Preferred Pharmacy (with phone number or street name): [place], [place]. [phone] (Phone) [phone] (Fax)    Agent: Please be advised that RX refills may take up to 3 business days. We ask that you follow-up with your pharmacy.

## 2018-04-24 NOTE — Telephone Encounter (Signed)
[place] Controlled Database Checked Last filled: 03/29/18 # 30 LOV w/PCP: 01/30/18 Next appt w/PCP: 05/01/18

## 2018-05-01 ENCOUNTER — Telehealth: Payer: Self-pay | Admitting: *Deleted

## 2018-05-01 ENCOUNTER — Ambulatory Visit: Payer: PPO | Admitting: Nurse Practitioner

## 2018-05-01 NOTE — Telephone Encounter (Addendum)
Left mess for patient to call back to speak to patient this morning before his OV with PCP at 10:30 am, due to COVID-19 pandemic. Just want to make sure he is well and not having any acute sxs. CRM created.

## 2018-05-01 NOTE — Telephone Encounter (Signed)
I spoke to pt- he was a no show today for 3 mo f/u /A1c check. He states since Caesar Chestnut, NP is leaving the practice he would like to wait until COVID-19 pandemic clears and he will contact our office to schedule with interim provider or new PCP.

## 2018-05-25 ENCOUNTER — Other Ambulatory Visit: Payer: Self-pay | Admitting: Nurse Practitioner

## 2018-05-25 DIAGNOSIS — G8929 Other chronic pain: Secondary | ICD-10-CM

## 2018-05-25 DIAGNOSIS — M25562 Pain in left knee: Principal | ICD-10-CM

## 2018-05-25 NOTE — Telephone Encounter (Signed)
Please advise on refill.

## 2018-05-26 ENCOUNTER — Other Ambulatory Visit: Payer: Self-pay | Admitting: Family Medicine

## 2018-05-26 MED ORDER — TRAMADOL HCL 50 MG PO TABS: 50.0000 mg | ORAL_TABLET | Freq: Every evening | ORAL | 0 refills | Status: DC | PRN

## 2018-05-26 MED ORDER — TIZANIDINE HCL 2 MG PO CAPS: 2.0000 mg | ORAL_CAPSULE | Freq: Three times a day (TID) | ORAL | 0 refills | Status: DC | PRN

## 2018-05-26 NOTE — Telephone Encounter (Signed)
[place] Controlled Database Checked Last filled: Tramadol 04/26/18 # 30 LOV w/PCP: 02/05/18 Next appt w/you: None  Tizanidine last written 12/14/16 ?

## 2018-05-26 NOTE — Telephone Encounter (Signed)
Requested medication (s) are due for refill today: yes  Requested medication (s) are on the active medication list: yes  Last refill:  02/21/18  Future visit scheduled: yes  Notes to clinic:  Pt a former pt of Caesar Chestnut NP- pt has a new pt appt at [facility] in 1 week- pt will need enough to get through to the appt.    Requested Prescriptions  Pending Prescriptions Disp Refills   diltiazem (CARDIZEM) 60 MG tablet 180 tablet 0    Sig: Take 1 tablet (60 mg total) by mouth 2 (two) times daily.     Cardiovascular:  Calcium Channel Blockers Failed - 05/26/2018  4:24 PM      Failed - Last BP in normal range    BP Readings from Last 1 Encounters:  01/30/18 140/82         Passed - Valid encounter within last 6 months    Recent Outpatient Visits          3 months ago Essential hypertension   [place], Delphia Grates, NP   6 years ago DIABETES MELLITUS, [facility] -Gaylyn Lambert, Hilliard Clark, MD   7 years ago Type II or unspecified type diabetes mellitus without mention of complication, not stated as uncontrolled   [place] Ellison, Sean, MD      Future Appointments            In 1 week Luetta Nutting, DO [facility], [place]   In 5 months Zigmund Daniel, [place], DO [facility], [facility]

## 2018-05-26 NOTE — Telephone Encounter (Signed)
Please have him schedule a follow-up with Baldo Ash in mid-June; he missed his appointment with Hollie Beach in March. We cannot continue to prescribe Tramadol without regular follow-up here. He will also need to be put on a pain contract and have drug testing done.

## 2018-05-26 NOTE — Telephone Encounter (Signed)
Left mess for patient to call back.  

## 2018-05-29 MED ORDER — DILTIAZEM HCL 60 MG PO TABS: 60.0000 mg | ORAL_TABLET | Freq: Two times a day (BID) | ORAL | 0 refills | Status: DC

## 2018-06-05 ENCOUNTER — Ambulatory Visit (INDEPENDENT_AMBULATORY_CARE_PROVIDER_SITE_OTHER): Payer: PPO | Admitting: Family Medicine

## 2018-06-05 VITALS — Ht 71.0 in | Wt 254.0 lb

## 2018-06-05 DIAGNOSIS — I10 Essential (primary) hypertension: Secondary | ICD-10-CM

## 2018-06-05 DIAGNOSIS — J4520 Mild intermittent asthma, uncomplicated: Secondary | ICD-10-CM | POA: Diagnosis not present

## 2018-06-05 DIAGNOSIS — E785 Hyperlipidemia, unspecified: Secondary | ICD-10-CM

## 2018-06-05 DIAGNOSIS — E119 Type 2 diabetes mellitus without complications: Secondary | ICD-10-CM

## 2018-06-05 NOTE — Progress Notes (Signed)
Edward Hawkins - 68 y.o. male MRN [id number]  Date of birth: 17-Jun-1950   This visit type was conducted due to national recommendations for restrictions regarding the COVID-19 Pandemic (e.g. social distancing).  This format is felt to be most appropriate for this patient at this time.  All issues noted in this document were discussed and addressed.  No physical exam was performed (except for noted visual exam findings with Video Visits).  I discussed the limitations of evaluation and management by telemedicine and the availability of in person appointments. The patient expressed understanding and agreed to proceed.  I connected with@ on 06/05/18 at  9:00 AM EDT by a video enabled telemedicine application and verified that I am speaking with the correct person using two identifiers.  Interactive audio and video telecommunications were attempted between this provider and patient, however failed, due to patient having technical difficulties OR patient did not have access to video capability.  We continued and completed visit with audio only.     Patient Location: Home  [place] [address]   Provider location:   [facility]  Chief Complaint  Patient presents with  . Establish Care    TOC/DM/Rx refills     HPI  Edward Hawkins is a 68 y.o. male who presents via audio/video conferencing for a telehealth visit today.  This is his initial visit with me.  He has a history of HTN, T2DM, HLD, asthma and prior CVA.  He is following up on chronic medical problems today.   -HTN:  BP has been well controlled based on readings at home with diltiazem and losartan. He denies chest pain, shortness of breath, vision changes, dizziness or palpitations.   -T2DM:  Diabetes has been well controlled with actos and dietary changes.  Last a1c 6.0% in 10/2017.  He denies polyuria/polydipsia, neuropathic symptoms or increased fatigue.   -HLD:  Doing well with atorvastatin.  He does  have history of prior CVA without significant residual deficits.  He denies myalgias related to statin use.   -Asthma:  Uses advair daily with albuterol as needed.  Symptoms stable with current inhalers.  Denies increased shortness of breath, wheezing or pm coughing.     ROS:  A comprehensive ROS was completed and negative except as noted per HPI  Past Medical History:  Diagnosis Date  . Arthritis   . ASTHMA 10/08/2006  . Asthma    last flareup more than 10 yrs ago.  . Cataract    extractions  . Colon polyps    tubular adenomas  . CVA 10/10/2005  . DIABETES MELLITUS, TYPE II 10/08/2006  . Diverticulosis   . HYPERLIPIDEMIA 10/08/2006  . HYPERTENSION 10/08/2006  . Primary localized osteoarthritis of left knee 12/28/2016  . RECTAL BLEEDING 09/15/2009  . S/P LASIK surgery of both eyes   . Seasonal allergies     Past Surgical History:  Procedure Laterality Date  . CATARACT EXTRACTION, BILATERAL    . COLONOSCOPY    . EYE SURGERY    . FRACTURE SURGERY    . KNEE SURGERY     left knee x2  . PARTIAL KNEE ARTHROPLASTY Left 12/28/2016   Procedure: LEFT UNICOMPARTMENTAL KNEE;  Surgeon: Marchia Bond, MD;  Location: [place];  Service: Orthopedics;  Laterality: Left;  . RADIOLOGY WITH ANESTHESIA N/A 10/28/2016   Procedure: MRI - CERVICAL SPINE WITHOUT;  Surgeon: Radiologist, Medication, MD;  Location: [place];  Service: Radiology;  Laterality: N/A;  . Stress Cardiolite  11/05/2005  .  TRANSTHORACIC ECHOCARDIOGRAM  10/12/2005    Family History  Problem Relation Age of Onset  . Cancer Brother        Lung Cancer  . Diabetes Mother   . Diabetes Father   . Cancer Daughter        Breast Cancer  . Colon cancer Neg Hx     Social History   Socioeconomic History  . Marital status: Single    Spouse name: Not on file  . Number of children: Not on file  . Years of education: Not on file  . Highest education level: Not on file  Occupational History  . Occupation: Works at [employer]   . Financial resource strain: Not on file  . Food insecurity:    Worry: Not on file    Inability: Not on file  . Transportation needs:    Medical: Not on file    Non-medical: Not on file  Tobacco Use  . Smoking status: Never Smoker  . Smokeless tobacco: Never Used  Substance and Sexual Activity  . Alcohol use: No  . Drug use: No  . Sexual activity: Not on file  Lifestyle  . Physical activity:    Days per week: Not on file    Minutes per session: Not on file  . Stress: Not on file  Relationships  . Social connections:    Talks on phone: Not on file    Gets together: Not on file    Attends religious service: Not on file    Active member of club or organization: Not on file    Attends meetings of clubs or organizations: Not on file    Relationship status: Not on file  . Intimate partner violence:    Fear of current or ex partner: Not on file    Emotionally abused: Not on file    Physically abused: Not on file    Forced sexual activity: Not on file  Other Topics Concern  . Not on file  Social History Narrative  . Not on file     Current Outpatient Medications:  .  ADVAIR DISKUS 250-50 MCG/DOSE AEPB, INHALE 1 DOSE BY MOUTH ONCE DAILY, Disp: 60 each, Rfl: 1 .  aspirin 325 MG tablet, Take 325 mg by mouth daily., Disp: , Rfl:  .  atorvastatin (LIPITOR) 20 MG tablet, TAKE 1 TABLET BY MOUTH ONCE DAILY, Disp: 90 tablet, Rfl: 3 .  Cholecalciferol (EQL VITAMIN D3) 2000 units CAPS, Take 2,000 Units daily by mouth., Disp: , Rfl:  .  diltiazem (CARDIZEM) 60 MG tablet, Take 1 tablet (60 mg total) by mouth 2 (two) times daily., Disp: 180 tablet, Rfl: 0 .  fish oil-omega-3 fatty acids 1000 MG capsule, Take 1 g by mouth daily., Disp: , Rfl:  .  losartan (COZAAR) 100 MG tablet, TAKE 1 TABLET BY MOUTH ONCE DAILY, Disp: 90 tablet, Rfl: 3 .  montelukast (SINGULAIR) 10 MG tablet, Take 1 tablet (10 mg total) by mouth at bedtime., Disp: 90 tablet, Rfl: 2 .  Multiple Vitamin (MULTIVITAMIN)  tablet, Take 1 tablet by mouth daily. , Disp: , Rfl:  .  pioglitazone (ACTOS) 15 MG tablet, TAKE 1 TABLET BY MOUTH ONCE DAILY, Disp: 90 tablet, Rfl: 3 .  promethazine-codeine (PHENERGAN WITH CODEINE) 6.25-10 MG/5ML syrup, Take 5 mLs by mouth every 4 (four) hours as needed., Disp: 120 mL, Rfl: 0 .  sennosides-docusate sodium (SENOKOT-S) 8.6-50 MG tablet, Take 2 tablets by mouth daily., Disp: 30 tablet, Rfl: 1 .  tizanidine (ZANAFLEX) 2 MG capsule, Take 1 capsule (2 mg total) by mouth 3 (three) times daily as needed for muscle spasms., Disp: 60 capsule, Rfl: 0 .  traMADol (ULTRAM) 50 MG tablet, Take 1 tablet (50 mg total) by mouth at bedtime as needed., Disp: 30 tablet, Rfl: 0  EXAM:  VITALS per patient if applicable: Ht 5\' 11"  (1.803 m)   Wt 254 lb (115.2 kg) Comment: pt reports  BMI 35.43 kg/m   GENERAL: alert, oriented, appears well and in no acute distress  HEENT: atraumatic, conjunttiva clear, no obvious abnormalities on inspection of external nose and ears  NECK: normal movements of the head and neck  LUNGS: on inspection no signs of respiratory distress, breathing rate appears normal, no obvious gross SOB, gasping or wheezing  CV: no obvious cyanosis  MS: moves all visible extremities without noticeable abnormality  PSYCH/NEURO: pleasant and cooperative, no obvious depression or anxiety, speech and thought processing grossly intact  ASSESSMENT AND PLAN:  Discussed the following assessment and plan:  Dyslipidemia -Stable with atorvastatin.  Lab Results  Component Value Date   LDLCALC 62 11/01/2017     HTN (hypertension) -BP well controlled at home, continue current medications.  -Follow low salt diet.  -F/u in ~6 months.   Diabetes -Blood sugars have been well controlled -Continue actos for now, follow low carb diet.    Asthma Stable, continue current inhalers.         I discussed the assessment and treatment plan with the patient. The patient was provided  an opportunity to ask questions and all were answered. The patient agreed with the plan and demonstrated an understanding of the instructions.   The patient was advised to call back or seek an in-person evaluation if the symptoms worsen or if the condition fails to improve as anticipated.    Luetta Nutting, DO

## 2018-06-07 ENCOUNTER — Encounter: Payer: Self-pay | Admitting: Family Medicine

## 2018-06-07 NOTE — Assessment & Plan Note (Signed)
-  Stable with atorvastatin.  Lab Results  Component Value Date   LDLCALC 62 11/01/2017

## 2018-06-07 NOTE — Assessment & Plan Note (Signed)
-  Blood sugars have been well controlled -Continue actos for now, follow low carb diet.

## 2018-06-07 NOTE — Assessment & Plan Note (Signed)
-  Stable, continue current inhalers.  

## 2018-06-07 NOTE — Assessment & Plan Note (Signed)
-  BP well controlled at home, continue current medications.  -Follow low salt diet.  -F/u in ~6 months.

## 2018-06-19 ENCOUNTER — Telehealth: Payer: Self-pay | Admitting: Family Medicine

## 2018-06-19 DIAGNOSIS — J4520 Mild intermittent asthma, uncomplicated: Secondary | ICD-10-CM

## 2018-06-19 NOTE — Telephone Encounter (Signed)
Copied from [place] [id number]. Topic: Quick Communication - Rx Refill/Question >> Jun 19, 2018  4:03 PM Loma Boston wrote: Medication: ADVAIR DISKUS 250-50 MCG/DOSE [place], [place]. [phone] (Phone) [phone] (Fax)

## 2018-06-20 MED ORDER — ADVAIR DISKUS 250-50 MCG/DOSE IN AEPB: INHALATION_SPRAY | RESPIRATORY_TRACT | 1 refills | Status: DC

## 2018-06-20 NOTE — Telephone Encounter (Signed)
Pt request Rx refill

## 2018-06-22 ENCOUNTER — Telehealth: Payer: Self-pay | Admitting: Family Medicine

## 2018-06-22 NOTE — Telephone Encounter (Signed)
Copied from [place] [id number]. Topic: Quick Communication - Rx Refill/Question >> Jun 22, 2018  4:39 PM Gustavus Messing wrote: Medication: traMADol (ULTRAM) 50 MG tablet   Has the patient contacted their pharmacy? Yes.   (Agent: If yes, when and what did the pharmacy advise?) The pharmacy told him to contact his Dr.  Preferred Pharmacy (with phone number or street name): [place], [place]. [phone] (Phone) [phone] (Fax)    Agent: Please be advised that RX refills may take up to 3 business days. We ask that you follow-up with your pharmacy.

## 2018-06-23 ENCOUNTER — Other Ambulatory Visit: Payer: Self-pay | Admitting: Family Medicine

## 2018-06-23 DIAGNOSIS — M25562 Pain in left knee: Secondary | ICD-10-CM

## 2018-06-23 DIAGNOSIS — G8929 Other chronic pain: Secondary | ICD-10-CM

## 2018-06-23 MED ORDER — TRAMADOL HCL 50 MG PO TABS: 50.0000 mg | ORAL_TABLET | Freq: Every evening | ORAL | 0 refills | Status: DC | PRN

## 2018-06-23 NOTE — Telephone Encounter (Signed)
Rx renewed

## 2018-06-23 NOTE — Progress Notes (Signed)
PDMP reviewed and appropriate. Tramadol renewed.

## 2018-06-26 ENCOUNTER — Other Ambulatory Visit: Payer: Self-pay | Admitting: Endocrinology

## 2018-07-17 ENCOUNTER — Telehealth: Payer: Self-pay | Admitting: Family Medicine

## 2018-07-17 NOTE — Telephone Encounter (Signed)
Copied from [place] [id number]. Topic: Quick Communication - Rx Refill/Question >> Jul 17, 2018  3:33 PM Blase Mess A wrote: Medication: atorvastatin (LIPITOR) 20 MG tablet [[id number]]   Has the patient contacted their pharmacy? Yes  (Agent: If no, request that the patient contact the pharmacy for the refill.) (Agent: If yes, when and what did the pharmacy advise?)  Preferred Pharmacy (with phone number or street name): [place], [place]. [phone] (Phone) [phone] (Fax)    Agent: Please be advised that RX refills may take up to 3 business days. We ask that you follow-up with your pharmacy.

## 2018-07-18 MED ORDER — ATORVASTATIN CALCIUM 20 MG PO TABS: 20.0000 mg | ORAL_TABLET | Freq: Every day | ORAL | 3 refills | Status: DC

## 2018-07-18 NOTE — Telephone Encounter (Signed)
Pt requested Rx refill. LOV 05/2018.

## 2018-07-21 ENCOUNTER — Other Ambulatory Visit: Payer: Self-pay | Admitting: Family Medicine

## 2018-07-21 ENCOUNTER — Telehealth: Payer: Self-pay | Admitting: Family Medicine

## 2018-07-21 DIAGNOSIS — G8929 Other chronic pain: Secondary | ICD-10-CM

## 2018-07-21 DIAGNOSIS — M25562 Pain in left knee: Secondary | ICD-10-CM

## 2018-07-21 MED ORDER — TRAMADOL HCL 50 MG PO TABS: 50.0000 mg | ORAL_TABLET | Freq: Every evening | ORAL | 0 refills | Status: DC | PRN

## 2018-07-21 NOTE — Telephone Encounter (Signed)
Copied from [place] [phone]. Topic: Quick Communication - Rx Refill/Question >> Jul 21, 2018  9:26 AM Keene Breath wrote: Medication: traMADol (ULTRAM) 50 MG tablet  Patient called for a refill for the above medication  Preferred Pharmacy (with phone number or street name): [place], [place]. [phone] (Phone) [phone] (Fax)

## 2018-07-21 NOTE — Telephone Encounter (Signed)
Completed.

## 2018-07-27 ENCOUNTER — Telehealth: Payer: Self-pay | Admitting: Family Medicine

## 2018-07-27 DIAGNOSIS — I10 Essential (primary) hypertension: Secondary | ICD-10-CM

## 2018-07-27 NOTE — Telephone Encounter (Signed)
diltiazem (CARDIZEM) 60 MG tablet   [facility]

## 2018-07-28 MED ORDER — DILTIAZEM HCL 60 MG PO TABS: 60.0000 mg | ORAL_TABLET | Freq: Two times a day (BID) | ORAL | 0 refills | Status: DC

## 2018-07-28 NOTE — Telephone Encounter (Signed)
Pt called requesting Rx refill. Next OV 10/2018.

## 2018-07-31 ENCOUNTER — Telehealth: Payer: Self-pay | Admitting: Family Medicine

## 2018-07-31 ENCOUNTER — Other Ambulatory Visit: Payer: Self-pay | Admitting: Endocrinology

## 2018-07-31 ENCOUNTER — Other Ambulatory Visit: Payer: Self-pay | Admitting: Family Medicine

## 2018-07-31 MED ORDER — LOSARTAN POTASSIUM 100 MG PO TABS: 100.0000 mg | ORAL_TABLET | Freq: Every day | ORAL | 3 refills | Status: DC

## 2018-07-31 NOTE — Telephone Encounter (Signed)
REFILL losartan (COZAAR) 100 MG tablet  [place], [place]. [phone] (Phone) [phone] (Fax

## 2018-08-01 NOTE — Telephone Encounter (Signed)
Filled yesterday

## 2018-08-17 ENCOUNTER — Telehealth: Payer: Self-pay | Admitting: Family Medicine

## 2018-08-17 NOTE — Telephone Encounter (Signed)
Patient would like to have his traMADol (ULTRAM) 50 MG tablet medication refilled and sent to his preferred pharmacy [facility] on [address].

## 2018-08-18 ENCOUNTER — Other Ambulatory Visit: Payer: Self-pay | Admitting: Family Medicine

## 2018-08-18 DIAGNOSIS — G8929 Other chronic pain: Secondary | ICD-10-CM

## 2018-08-18 MED ORDER — TRAMADOL HCL 50 MG PO TABS: 50.0000 mg | ORAL_TABLET | Freq: Every evening | ORAL | 2 refills | Status: DC | PRN

## 2018-08-18 NOTE — Telephone Encounter (Signed)
Called Pt he is aware. Call completed.

## 2018-08-18 NOTE — Telephone Encounter (Signed)
Refilled, next scheduled interval f/u set for 10/2018

## 2018-08-22 DIAGNOSIS — H524 Presbyopia: Secondary | ICD-10-CM | POA: Diagnosis not present

## 2018-08-22 DIAGNOSIS — H52222 Regular astigmatism, left eye: Secondary | ICD-10-CM | POA: Diagnosis not present

## 2018-08-22 DIAGNOSIS — Z9889 Other specified postprocedural states: Secondary | ICD-10-CM | POA: Diagnosis not present

## 2018-08-22 DIAGNOSIS — E119 Type 2 diabetes mellitus without complications: Secondary | ICD-10-CM | POA: Diagnosis not present

## 2018-08-22 DIAGNOSIS — Z7984 Long term (current) use of oral hypoglycemic drugs: Secondary | ICD-10-CM | POA: Diagnosis not present

## 2018-08-22 DIAGNOSIS — Z961 Presence of intraocular lens: Secondary | ICD-10-CM | POA: Diagnosis not present

## 2018-09-21 ENCOUNTER — Encounter: Payer: Self-pay | Admitting: Family Medicine

## 2018-09-21 ENCOUNTER — Other Ambulatory Visit: Payer: Self-pay

## 2018-09-21 ENCOUNTER — Ambulatory Visit (INDEPENDENT_AMBULATORY_CARE_PROVIDER_SITE_OTHER): Payer: PPO | Admitting: Family Medicine

## 2018-09-21 DIAGNOSIS — J069 Acute upper respiratory infection, unspecified: Secondary | ICD-10-CM

## 2018-09-21 MED ORDER — PREDNISONE 20 MG PO TABS: 40.0000 mg | ORAL_TABLET | Freq: Every day | ORAL | 0 refills | Status: AC

## 2018-09-21 MED ORDER — BENZONATATE 100 MG PO CAPS: 100.0000 mg | ORAL_CAPSULE | Freq: Two times a day (BID) | ORAL | 0 refills | Status: DC | PRN

## 2018-09-21 NOTE — Progress Notes (Signed)
Edward Hawkins - 68 y.o. male MRN [id number]  Date of birth: [birth year]   This visit type was conducted due to national recommendations for restrictions regarding the COVID-19 Pandemic (e.g. social distancing).  This format is felt to be most appropriate for this patient at this time.  All issues noted in this document were discussed and addressed.  No physical exam was performed (except for noted visual exam findings with Video Visits).  I discussed the limitations of evaluation and management by telemedicine and the availability of in person appointments. The patient expressed understanding and agreed to proceed.  I connected with@ on 09/21/18 at 10:30 AM EDT by a video enabled telemedicine application and verified that I am speaking with the correct person using two identifiers.  Interactive audio and video telecommunications were attempted between this provider and patient, however failed, due to patient having technical difficulties OR patient did not have access to video capability.  We continued and completed visit with audio only.    Patient Location: Home [place] [address]   Provider location:   Home office  Chief Complaint  Patient presents with  . Cough    pt is c/o dry couging,chills at times,had sore throat but better now/1 wk/ took mucinex DM otc.     HPI  Edward Hawkins is a 68 y.o. male who presents via audio/video conferencing for a telehealth visit today.  He has complaint of dry cough for about 1 week.  Had sore throat and nausea as well initially but this has improved.  He did feel like he had some chills initially but has not had any fever.  He does have a history of asthma but denies increased shortness of breath.  He has had some improvement with rescue inhaler.  He has not had body aches, rash, headache, vomiting or diarrhea.    ROS:  A comprehensive ROS was completed and negative except as noted per HPI  Past Medical History:   Diagnosis Date  . Arthritis   . ASTHMA 10/08/2006  . Asthma    last flareup more than 10 yrs ago.  . Cataract    extractions  . Colon polyps    tubular adenomas  . CVA 10/10/2005  . DIABETES MELLITUS, TYPE II 10/08/2006  . Diverticulosis   . HYPERLIPIDEMIA 10/08/2006  . HYPERTENSION 10/08/2006  . Primary localized osteoarthritis of left knee 12/28/2016  . RECTAL BLEEDING 09/15/2009  . S/P LASIK surgery of both eyes   . Seasonal allergies     Past Surgical History:  Procedure Laterality Date  . CATARACT EXTRACTION, BILATERAL    . COLONOSCOPY    . EYE SURGERY    . FRACTURE SURGERY    . KNEE SURGERY     left knee x2  . PARTIAL KNEE ARTHROPLASTY Left 12/28/2016   Procedure: LEFT UNICOMPARTMENTAL KNEE;  Surgeon: Marchia Bond, MD;  Location: [place];  Service: Orthopedics;  Laterality: Left;  . RADIOLOGY WITH ANESTHESIA N/A 10/28/2016   Procedure: MRI - CERVICAL SPINE WITHOUT;  Surgeon: Radiologist, Medication, MD;  Location: [place];  Service: Radiology;  Laterality: N/A;  . Stress Cardiolite  11/05/2005  . TRANSTHORACIC ECHOCARDIOGRAM  10/12/2005    Family History  Problem Relation Age of Onset  . Cancer Brother        Lung Cancer  . Diabetes Mother   . Diabetes Father   . Cancer Daughter        Breast Cancer  . Colon cancer Neg Hx  Social History   Socioeconomic History  . Marital status: Single    Spouse name: Not on file  . Number of children: Not on file  . Years of education: Not on file  . Highest education level: Not on file  Occupational History  . Occupation: Works at [employer]  . Financial resource strain: Not on file  . Food insecurity    Worry: Not on file    Inability: Not on file  . Transportation needs    Medical: Not on file    Non-medical: Not on file  Tobacco Use  . Smoking status: Never Smoker  . Smokeless tobacco: Never Used  Substance and Sexual Activity  . Alcohol use: No  . Drug use: No  . Sexual activity: Not on file   Lifestyle  . Physical activity    Days per week: Not on file    Minutes per session: Not on file  . Stress: Not on file  Relationships  . Social Herbalist on phone: Not on file    Gets together: Not on file    Attends religious service: Not on file    Active member of club or organization: Not on file    Attends meetings of clubs or organizations: Not on file    Relationship status: Not on file  . Intimate partner violence    Fear of current or ex partner: Not on file    Emotionally abused: Not on file    Physically abused: Not on file    Forced sexual activity: Not on file  Other Topics Concern  . Not on file  Social History Narrative  . Not on file     Current Outpatient Medications:  .  ADVAIR DISKUS 250-50 MCG/DOSE AEPB, INHALE 1 DOSE BY MOUTH ONCE DAILY, Disp: 90 each, Rfl: 1 .  aspirin 325 MG tablet, Take 325 mg by mouth daily., Disp: , Rfl:  .  atorvastatin (LIPITOR) 20 MG tablet, Take 1 tablet (20 mg total) by mouth daily., Disp: 90 tablet, Rfl: 3 .  Cholecalciferol (EQL VITAMIN D3) 2000 units CAPS, Take 2,000 Units daily by mouth., Disp: , Rfl:  .  diltiazem (CARDIZEM) 60 MG tablet, Take 1 tablet (60 mg total) by mouth 2 (two) times daily., Disp: 180 tablet, Rfl: 0 .  fish oil-omega-3 fatty acids 1000 MG capsule, Take 1 g by mouth daily., Disp: , Rfl:  .  losartan (COZAAR) 100 MG tablet, Take 1 tablet (100 mg total) by mouth daily., Disp: 90 tablet, Rfl: 3 .  montelukast (SINGULAIR) 10 MG tablet, Take 1 tablet (10 mg total) by mouth at bedtime., Disp: 90 tablet, Rfl: 2 .  Multiple Vitamin (MULTIVITAMIN) tablet, Take 1 tablet by mouth daily. , Disp: , Rfl:  .  pioglitazone (ACTOS) 15 MG tablet, Take 1 tablet by mouth once daily, Disp: 90 tablet, Rfl: 0 .  traMADol (ULTRAM) 50 MG tablet, Take 1 tablet (50 mg total) by mouth at bedtime as needed., Disp: 30 tablet, Rfl: 2 .  benzonatate (TESSALON) 100 MG capsule, Take 1-2 capsules (100-200 mg total) by mouth 2  (two) times daily as needed for cough., Disp: 30 capsule, Rfl: 0 .  predniSONE (DELTASONE) 20 MG tablet, Take 2 tablets (40 mg total) by mouth daily with breakfast for 5 days., Disp: 10 tablet, Rfl: 0 .  sennosides-docusate sodium (SENOKOT-S) 8.6-50 MG tablet, Take 2 tablets by mouth daily. (Patient not taking: Reported on 09/21/2018), Disp: 30 tablet, Rfl: 1 .  tizanidine (ZANAFLEX) 2 MG capsule, Take 1 capsule (2 mg total) by mouth 3 (three) times daily as needed for muscle spasms. (Patient not taking: Reported on 09/21/2018), Disp: 60 capsule, Rfl: 0  EXAM:  VITALS per patient if applicable: Temp 27.0 F (36.9 C) (Oral) Comment: pt reprt  Ht 5\' 11"  (1.803 m)   Wt 258 lb (117 kg) Comment: pt reprt  BMI 35.98 kg/m   GENERAL: alert, oriented, appears well and in no acute distress  HEENT: atraumatic, conjunttiva clear, no obvious abnormalities on inspection of external nose and ears  NECK: normal movements of the head and neck  LUNGS: on inspection no signs of respiratory distress, breathing rate appears normal, no obvious gross SOB, gasping or wheezing  CV: no obvious cyanosis  MS: moves all visible extremities without noticeable abnormality  PSYCH/NEURO: pleasant and cooperative, no obvious depression or anxiety, speech and thought processing grossly intact  ASSESSMENT AND PLAN:  Discussed the following assessment and plan:  URI (upper respiratory infection) -History doesn't sound typical of COVID and with overall improving symptoms will hold off on testing for now.  -Will start steroid burst for cough and asthma.  Continue current inhalers.  -Tessalon perles as needed for cough -He understands he should call for any changing or worsening symptoms       I discussed the assessment and treatment plan with the patient. The patient was provided an opportunity to ask questions and all were answered. The patient agreed with the plan and demonstrated an understanding of the  instructions.   The patient was advised to call back or seek an in-person evaluation if the symptoms worsen or if the condition fails to improve as anticipated.  Luetta Nutting, DO

## 2018-09-21 NOTE — Assessment & Plan Note (Signed)
-  History doesn't sound typical of COVID and with overall improving symptoms will hold off on testing for now.  -Will start steroid burst for cough and asthma.  Continue current inhalers.  -Tessalon perles as needed for cough -He understands he should call for any changing or worsening symptoms

## 2018-09-27 ENCOUNTER — Telehealth: Payer: Self-pay | Admitting: Family Medicine

## 2018-09-27 NOTE — Telephone Encounter (Signed)
Dr. Matthews - please advise.  

## 2018-09-27 NOTE — Telephone Encounter (Signed)
Can we add him on for a VV or telephone visit to discuss tomorrow?

## 2018-09-27 NOTE — Telephone Encounter (Signed)
Called and schedule patient for telephone visit for 09/28/2018 at 10:00 am

## 2018-09-27 NOTE — Telephone Encounter (Signed)
Pt had a virtual appt 8/13 for a cough.  Pt states his cough is not better, and persistent.  Pt has finished the prednisone.  Pt wants to know what he should do now?  [place], [place]. [phone] (Phone) [phone] (Fax)

## 2018-09-27 NOTE — Telephone Encounter (Signed)
Edward Hawkins can you help me schedule this pt please!

## 2018-09-28 ENCOUNTER — Ambulatory Visit (INDEPENDENT_AMBULATORY_CARE_PROVIDER_SITE_OTHER): Payer: PPO | Admitting: Family Medicine

## 2018-09-28 ENCOUNTER — Other Ambulatory Visit: Payer: Self-pay

## 2018-09-28 ENCOUNTER — Encounter: Payer: Self-pay | Admitting: Family Medicine

## 2018-09-28 DIAGNOSIS — J069 Acute upper respiratory infection, unspecified: Secondary | ICD-10-CM

## 2018-09-28 MED ORDER — PIOGLITAZONE HCL 15 MG PO TABS: 15.0000 mg | ORAL_TABLET | Freq: Every day | ORAL | 1 refills | Status: DC

## 2018-09-28 MED ORDER — HYDROCODONE-HOMATROPINE 5-1.5 MG/5ML PO SYRP: 5.0000 mL | ORAL_SOLUTION | Freq: Three times a day (TID) | ORAL | 0 refills | Status: DC | PRN

## 2018-09-28 NOTE — Progress Notes (Signed)
Edward Hawkins - 68 y.o. male MRN [id number]  Date of birth: [birth year]   This visit type was conducted due to national recommendations for restrictions regarding the COVID-19 Pandemic (e.g. social distancing).  This format is felt to be most appropriate for this patient at this time.  All issues noted in this document were discussed and addressed.  No physical exam was performed (except for noted visual exam findings with Video Visits).  I discussed the limitations of evaluation and management by telemedicine and the availability of in person appointments. The patient expressed understanding and agreed to proceed.  I connected with@ on 09/28/18 at 10:00 AM EDT by a video enabled telemedicine application and verified that I am speaking with the correct person using two identifiers.  Interactive audio and video telecommunications were attempted between this provider and patient, however failed, due to patient having technical difficulties OR patient did not have access to video capability.  We continued and completed visit with audio only.     Patient Location: Home [place] [address]   Provider location:   Home office  Chief Complaint  Patient presents with  . Cough    pt still has some cough left, med is not helping    HPI  Edward Hawkins is a 68 y.o. male who presents via audio/video conferencing for a telehealth visit today.  He is following up today from recent URI.  Reports that scratchy throat and wheezing have resolved but he continue to have dry cough.  He denies increased shortness of breath, fever, chills, headache, body aches.  Cough does keep him from sleeping well.     ROS:  A comprehensive ROS was completed and negative except as noted per HPI  Past Medical History:  Diagnosis Date  . Arthritis   . ASTHMA 10/08/2006  . Asthma    last flareup more than 10 yrs ago.  . Cataract    extractions  . Colon polyps    tubular adenomas  . CVA  10/10/2005  . DIABETES MELLITUS, TYPE II 10/08/2006  . Diverticulosis   . HYPERLIPIDEMIA 10/08/2006  . HYPERTENSION 10/08/2006  . Primary localized osteoarthritis of left knee 12/28/2016  . RECTAL BLEEDING 09/15/2009  . S/P LASIK surgery of both eyes   . Seasonal allergies     Past Surgical History:  Procedure Laterality Date  . CATARACT EXTRACTION, BILATERAL    . COLONOSCOPY    . EYE SURGERY    . FRACTURE SURGERY    . KNEE SURGERY     left knee x2  . PARTIAL KNEE ARTHROPLASTY Left 12/28/2016   Procedure: LEFT UNICOMPARTMENTAL KNEE;  Surgeon: Marchia Bond, MD;  Location: [facility];  Service: Orthopedics;  Laterality: Left;  . RADIOLOGY WITH ANESTHESIA N/A 10/28/2016   Procedure: MRI - CERVICAL SPINE WITHOUT;  Surgeon: Radiologist, Medication, MD;  Location: [facility];  Service: Radiology;  Laterality: N/A;  . Stress Cardiolite  11/05/2005  . TRANSTHORACIC ECHOCARDIOGRAM  10/12/2005    Family History  Problem Relation Age of Onset  . Cancer Brother        Lung Cancer  . Diabetes Mother   . Diabetes Father   . Cancer Daughter        Breast Cancer  . Colon cancer Neg Hx     Social History   Socioeconomic History  . Marital status: Single    Spouse name: Not on file  . Number of children: Not on file  . Years of education: Not on file  .  Highest education level: Not on file  Occupational History  . Occupation: Works at [employer]  . Financial resource strain: Not on file  . Food insecurity    Worry: Not on file    Inability: Not on file  . Transportation needs    Medical: Not on file    Non-medical: Not on file  Tobacco Use  . Smoking status: Never Smoker  . Smokeless tobacco: Never Used  Substance and Sexual Activity  . Alcohol use: No  . Drug use: No  . Sexual activity: Not on file  Lifestyle  . Physical activity    Days per week: Not on file    Minutes per session: Not on file  . Stress: Not on file  Relationships  . Social Herbalist on  phone: Not on file    Gets together: Not on file    Attends religious service: Not on file    Active member of club or organization: Not on file    Attends meetings of clubs or organizations: Not on file    Relationship status: Not on file  . Intimate partner violence    Fear of current or ex partner: Not on file    Emotionally abused: Not on file    Physically abused: Not on file    Forced sexual activity: Not on file  Other Topics Concern  . Not on file  Social History Narrative  . Not on file     Current Outpatient Medications:  .  ADVAIR DISKUS 250-50 MCG/DOSE AEPB, INHALE 1 DOSE BY MOUTH ONCE DAILY, Disp: 90 each, Rfl: 1 .  aspirin 325 MG tablet, Take 325 mg by mouth daily., Disp: , Rfl:  .  atorvastatin (LIPITOR) 20 MG tablet, Take 1 tablet (20 mg total) by mouth daily., Disp: 90 tablet, Rfl: 3 .  Cholecalciferol (EQL VITAMIN D3) 2000 units CAPS, Take 2,000 Units daily by mouth., Disp: , Rfl:  .  diltiazem (CARDIZEM) 60 MG tablet, Take 1 tablet (60 mg total) by mouth 2 (two) times daily., Disp: 180 tablet, Rfl: 0 .  fish oil-omega-3 fatty acids 1000 MG capsule, Take 1 g by mouth daily., Disp: , Rfl:  .  losartan (COZAAR) 100 MG tablet, Take 1 tablet (100 mg total) by mouth daily., Disp: 90 tablet, Rfl: 3 .  montelukast (SINGULAIR) 10 MG tablet, Take 1 tablet (10 mg total) by mouth at bedtime., Disp: 90 tablet, Rfl: 2 .  Multiple Vitamin (MULTIVITAMIN) tablet, Take 1 tablet by mouth daily. , Disp: , Rfl:  .  pioglitazone (ACTOS) 15 MG tablet, Take 1 tablet (15 mg total) by mouth daily., Disp: 90 tablet, Rfl: 1 .  traMADol (ULTRAM) 50 MG tablet, Take 1 tablet (50 mg total) by mouth at bedtime as needed., Disp: 30 tablet, Rfl: 2 .  HYDROcodone-homatropine (HYCODAN) 5-1.5 MG/5ML syrup, Take 5 mLs by mouth every 8 (eight) hours as needed for cough., Disp: 150 mL, Rfl: 0 .  sennosides-docusate sodium (SENOKOT-S) 8.6-50 MG tablet, Take 2 tablets by mouth daily. (Patient not taking:  Reported on 09/21/2018), Disp: 30 tablet, Rfl: 1 .  tizanidine (ZANAFLEX) 2 MG capsule, Take 1 capsule (2 mg total) by mouth 3 (three) times daily as needed for muscle spasms. (Patient not taking: Reported on 09/21/2018), Disp: 60 capsule, Rfl: 0  EXAM:  VITALS per patient if applicable: Temp 62.9 F (36.9 C) (Oral) Comment: pt reprt  Ht 5\' 11"  (1.803 m)   Wt 260 lb (117.9  kg) Comment: pt report  BMI 36.26 kg/m   GENERAL: alert, oriented, appears well and in no acute distress   LUNGS no signs of respiratory distress,  no obvious gross SOB, gasping or wheezing   ASSESSMENT AND PLAN:  Discussed the following assessment and plan:  URI (upper respiratory infection) Still with continued cough, denies worsening symptoms.  Cough not improved with tessalon.  Short term hycodan sent in.  Recommend he push fluids and try mucinex as well.         I discussed the assessment and treatment plan with the patient. The patient was provided an opportunity to ask questions and all were answered. The patient agreed with the plan and demonstrated an understanding of the instructions.   The patient was advised to call back or seek an in-person evaluation if the symptoms worsen or if the condition fails to improve as anticipated.   Luetta Nutting, DO

## 2018-09-28 NOTE — Assessment & Plan Note (Signed)
Still with continued cough, denies worsening symptoms.  Cough not improved with tessalon.  Short term hycodan sent in.  Recommend he push fluids and try mucinex as well.

## 2018-10-09 ENCOUNTER — Encounter: Payer: Self-pay | Admitting: Family Medicine

## 2018-10-09 ENCOUNTER — Other Ambulatory Visit: Payer: Self-pay

## 2018-10-09 ENCOUNTER — Ambulatory Visit (INDEPENDENT_AMBULATORY_CARE_PROVIDER_SITE_OTHER): Payer: PPO | Admitting: Family Medicine

## 2018-10-09 VITALS — BP 160/90 | HR 119 | Temp 98.6°F | Ht 71.0 in | Wt 258.6 lb

## 2018-10-09 DIAGNOSIS — I10 Essential (primary) hypertension: Secondary | ICD-10-CM

## 2018-10-09 DIAGNOSIS — Z23 Encounter for immunization: Secondary | ICD-10-CM

## 2018-10-09 DIAGNOSIS — M62838 Other muscle spasm: Secondary | ICD-10-CM

## 2018-10-09 DIAGNOSIS — E119 Type 2 diabetes mellitus without complications: Secondary | ICD-10-CM | POA: Diagnosis not present

## 2018-10-09 LAB — CBC WITH DIFFERENTIAL/PLATELET
Basophils Absolute: 0 10*3/uL (ref 0.0–0.1)
Basophils Relative: 0.5 % (ref 0.0–3.0)
Eosinophils Absolute: 0.2 10*3/uL (ref 0.0–0.7)
Eosinophils Relative: 2.3 % (ref 0.0–5.0)
HCT: 42.4 % (ref 39.0–52.0)
Hemoglobin: 14.2 g/dL (ref 13.0–17.0)
Lymphocytes Relative: 22.4 % (ref 12.0–46.0)
Lymphs Abs: 1.7 10*3/uL (ref 0.7–4.0)
MCHC: 33.5 g/dL (ref 30.0–36.0)
MCV: 98.1 fl (ref 78.0–100.0)
Monocytes Absolute: 0.9 10*3/uL (ref 0.1–1.0)
Monocytes Relative: 11.9 % (ref 3.0–12.0)
Neutro Abs: 4.9 10*3/uL (ref 1.4–7.7)
Neutrophils Relative %: 62.9 % (ref 43.0–77.0)
Platelets: 305 10*3/uL (ref 150.0–400.0)
RBC: 4.33 Mil/uL (ref 4.22–5.81)
RDW: 13.5 % (ref 11.5–15.5)
WBC: 7.8 10*3/uL (ref 4.0–10.5)

## 2018-10-09 LAB — BASIC METABOLIC PANEL
BUN: 16 mg/dL (ref 6–23)
CO2: 27 mEq/L (ref 19–32)
Calcium: 9.5 mg/dL (ref 8.4–10.5)
Chloride: 104 mEq/L (ref 96–112)
Creatinine, Ser: 1.07 mg/dL (ref 0.40–1.50)
GFR: 68.59 mL/min (ref >60.00)
Glucose, Bld: 121 mg/dL — ABNORMAL HIGH (ref 70–99)
Potassium: 4.4 mEq/L (ref 3.5–5.1)
Sodium: 139 mEq/L (ref 135–145)

## 2018-10-09 LAB — HEMOGLOBIN A1C: Hgb A1c MFr Bld: 6.5 % (ref 4.6–6.5)

## 2018-10-09 MED ORDER — NAPROXEN 500 MG PO TABS: 500.0000 mg | ORAL_TABLET | Freq: Two times a day (BID) | ORAL | 0 refills | Status: DC

## 2018-10-09 MED ORDER — TIZANIDINE HCL 2 MG PO CAPS: 2.0000 mg | ORAL_CAPSULE | Freq: Three times a day (TID) | ORAL | 0 refills | Status: DC | PRN

## 2018-10-09 NOTE — Assessment & Plan Note (Signed)
-  Possible 2/2 to sleep positioning and/or recent cough.  -Given HEP for Neck/shoulder.  -Start naproxen and tizanidine as needed.  Advised that tizanidine may cause drowsiness and he should use caution if operating vehicle or machinery.    -May continue ice/heat for comfort.

## 2018-10-09 NOTE — Patient Instructions (Signed)
Exercises for Levator Scapulae Muscle Pain Syndrome 1      Sit tall on a straight chair. Grab the bottom of the seat with your hand on the injured side to lower the shoulder by pulling down. Tilt and turn your head to the opposite side. Nod head forward until a stretch is felt along side and back of neck. Apply extra pressure (gently) with your hand to increase the stretch if needed. Hold the stretch and relax.  2      Sitting on a bench or chair on the side edge, hold under the bench with the hand on the tight side, ensuring that the arm is out from the body and that the shoulder is down. Drop your head forward onto your chest. Side bend and rotate the neck away from the tight side, bringing your chin towards the opposite armpit. Bring your opposite hand up onto the side of your head to assist the stretch. Return to the starting position and repeat.  3      Place one hand behind your buttock to lower your shoulder. Turn your head to the opposite side and look down. Gently pull down on your head with the other hand and maintain the position when you feel a stretching sensation. Repeat.  4      You can do the stretch seated or standing. Keeping the shoulder down on the side that you are stretching, look down and toward the opposite hip, until you feel a comfortable stretch in the back of your neck. Hold for the recommended time then relax. You can sit on your hand-on the side you are stretching-to pull the shoulder down even more. Use your opposite arm to pull your head down into a deeper stretch.  5      Step on a strong band on the same side as the side to stretch. Grab the band with the hand, allowing the band to anchor your arm and shoulder down. Side-bend your head toward the opposite shoulder of the anchoring side. Next, tuck your chin toward the opposite side as to look at your front pocket. You should feel a comfortable stretch on the side of your neck  and above your shoulder blade-on the same-side as the band. Hold the position and repeat the exercise on the other side by stepping on the band with the opposite foot.  6      Stand with your arms relaxed on each side of your body. Make backward circles with your shoulders.  7      Stand with your arms relaxed on each side of your body. Make forward circles with your shoulders. Relax and repeat.  8      Stand against a wall with a spikey ball placed on your upper back above your scapula (on the levator scapulae muscle). Apply a comfortable pressure ("feel good" pain). Keep the pressure, move your body up and down to slowly roll the ball along the muscle. You can pause on the tighter spots for up to 30 seconds. You can vary the direction of the rolling and switch for a more horizontal style if you want to.

## 2018-10-09 NOTE — Progress Notes (Signed)
Edward Hawkins - 68 y.o. male MRN [id number]  Date of birth: December 14, 1950  Subjective Chief Complaint  Patient presents with  . Pain    left shoulder pain/ using heat and ice/ woke up one morning and was there/ 7 days/ wants flu shot    HPI Edward Hawkins is a 68 y.o. male with history of T2DM, HTN, and HLD here today with complaint of neck/shoulder pain.  He reports that he woke up about 1 week ago with pain on the L side of his neck.  Has has radiation to the back of the shoulder.  He thinks is may be from strange positioning while sleeping.  He denies radiation down the arm, weakness, numbness or tingling.  He has tried alternating ice and heat without much improvement.  He did have URI with cough as well shortly before these symptoms began.  URI symptoms have resolved.    ROS:  A comprehensive ROS was completed and negative except as noted per HPI  No Known Allergies  Past Medical History:  Diagnosis Date  . Arthritis   . ASTHMA 10/08/2006  . Asthma    last flareup more than 10 yrs ago.  . Cataract    extractions  . Colon polyps    tubular adenomas  . CVA 10/10/2005  . DIABETES MELLITUS, TYPE II 10/08/2006  . Diverticulosis   . HYPERLIPIDEMIA 10/08/2006  . HYPERTENSION 10/08/2006  . Primary localized osteoarthritis of left knee 12/28/2016  . RECTAL BLEEDING 09/15/2009  . S/P LASIK surgery of both eyes   . Seasonal allergies     Past Surgical History:  Procedure Laterality Date  . CATARACT EXTRACTION, BILATERAL    . COLONOSCOPY    . EYE SURGERY    . FRACTURE SURGERY    . KNEE SURGERY     left knee x2  . PARTIAL KNEE ARTHROPLASTY Left 12/28/2016   Procedure: LEFT UNICOMPARTMENTAL KNEE;  Surgeon: Marchia Bond, MD;  Location: [facility];  Service: Orthopedics;  Laterality: Left;  . RADIOLOGY WITH ANESTHESIA N/A 10/28/2016   Procedure: MRI - CERVICAL SPINE WITHOUT;  Surgeon: Radiologist, Medication, MD;  Location: [facility];  Service: Radiology;  Laterality: N/A;  . Stress  Cardiolite  11/05/2005  . TRANSTHORACIC ECHOCARDIOGRAM  10/12/2005    Social History   Socioeconomic History  . Marital status: Single    Spouse name: Not on file  . Number of children: Not on file  . Years of education: Not on file  . Highest education level: Not on file  Occupational History  . Occupation: Works at [employer]  . Financial resource strain: Not on file  . Food insecurity    Worry: Not on file    Inability: Not on file  . Transportation needs    Medical: Not on file    Non-medical: Not on file  Tobacco Use  . Smoking status: Never Smoker  . Smokeless tobacco: Never Used  Substance and Sexual Activity  . Alcohol use: No  . Drug use: No  . Sexual activity: Not on file  Lifestyle  . Physical activity    Days per week: Not on file    Minutes per session: Not on file  . Stress: Not on file  Relationships  . Social Herbalist on phone: Not on file    Gets together: Not on file    Attends religious service: Not on file    Active member of club or organization: Not on file  Attends meetings of clubs or organizations: Not on file    Relationship status: Not on file  Other Topics Concern  . Not on file  Social History Narrative  . Not on file    Family History  Problem Relation Age of Onset  . Cancer Brother        Lung Cancer  . Diabetes Mother   . Diabetes Father   . Cancer Daughter        Breast Cancer  . Colon cancer Neg Hx     Health Maintenance  Topic Date Due  . PNA vac Low Risk Adult (2 of 2 - PCV13) 10/02/2016  . HEMOGLOBIN A1C  05/02/2018  . INFLUENZA VACCINE  09/09/2018  . OPHTHALMOLOGY EXAM  09/09/2018  . FOOT EXAM  11/02/2018  . COLONOSCOPY  12/10/2020  . TETANUS/TDAP  10/02/2023  . Hepatitis C Screening  Completed     ----------------------------------------------------------------------------------------------------------------------------------------------------------------------------------------------------------------- Physical Exam BP (!) 160/90   Pulse (!) 119   Temp 98.6 F (37 C) (Oral)   Ht 5\' 11"  (1.803 m)   Wt 258 lb 9.6 oz (117.3 kg)   SpO2 99%   BMI 36.07 kg/m   Physical Exam Constitutional:      Appearance: Normal appearance.  HENT:     Right Ear: Tympanic membrane normal.     Left Ear: Tympanic membrane normal.     Mouth/Throat:     Mouth: Mucous membranes are moist.  Eyes:     General: No scleral icterus. Neck:     Musculoskeletal: Neck supple.  Cardiovascular:     Rate and Rhythm: Normal rate and regular rhythm.  Pulmonary:     Effort: Pulmonary effort is normal.     Breath sounds: Normal breath sounds.  Musculoskeletal:     Comments: TTP and spasm noted along L levator scapulae   Skin:    General: Skin is warm and dry.  Neurological:     General: No focal deficit present.     Mental Status: He is alert.  Psychiatric:        Mood and Affect: Mood normal.        Behavior: Behavior normal.     ------------------------------------------------------------------------------------------------------------------------------------------------------------------------------------------------------------------- Assessment and Plan  Muscle spasm -Possible 2/2 to sleep positioning and/or recent cough.  -Given HEP for Neck/shoulder.  -Start naproxen and tizanidine as needed.  Advised that tizanidine may cause drowsiness and he should use caution if operating vehicle or machinery.    -May continue ice/heat for comfort.

## 2018-10-12 NOTE — Progress Notes (Signed)
Glucose is up compared to previous check.  I would recommend adding metformin in addition to his actos.  He should follow a low carbohydrate diet with regular exercise as well.

## 2018-10-13 ENCOUNTER — Other Ambulatory Visit: Payer: Self-pay | Admitting: Family Medicine

## 2018-10-13 MED ORDER — METFORMIN HCL ER 500 MG PO TB24: 1000.0000 mg | ORAL_TABLET | Freq: Every day | ORAL | 1 refills | Status: DC

## 2018-10-13 NOTE — Progress Notes (Signed)
Rx sent in

## 2018-10-20 ENCOUNTER — Other Ambulatory Visit: Payer: Self-pay | Admitting: Family Medicine

## 2018-10-20 DIAGNOSIS — J4520 Mild intermittent asthma, uncomplicated: Secondary | ICD-10-CM

## 2018-11-03 ENCOUNTER — Encounter: Payer: PPO | Admitting: Nurse Practitioner

## 2018-11-06 ENCOUNTER — Ambulatory Visit (INDEPENDENT_AMBULATORY_CARE_PROVIDER_SITE_OTHER): Payer: PPO | Admitting: Family Medicine

## 2018-11-06 ENCOUNTER — Encounter: Payer: Self-pay | Admitting: Family Medicine

## 2018-11-06 ENCOUNTER — Other Ambulatory Visit: Payer: Self-pay

## 2018-11-06 VITALS — BP 122/66 | HR 100 | Temp 97.9°F | Resp 18 | Ht 71.0 in | Wt 255.4 lb

## 2018-11-06 DIAGNOSIS — E785 Hyperlipidemia, unspecified: Secondary | ICD-10-CM | POA: Diagnosis not present

## 2018-11-06 DIAGNOSIS — Z0000 Encounter for general adult medical examination without abnormal findings: Secondary | ICD-10-CM

## 2018-11-06 DIAGNOSIS — E119 Type 2 diabetes mellitus without complications: Secondary | ICD-10-CM

## 2018-11-06 DIAGNOSIS — I10 Essential (primary) hypertension: Secondary | ICD-10-CM

## 2018-11-06 DIAGNOSIS — Z23 Encounter for immunization: Secondary | ICD-10-CM

## 2018-11-06 DIAGNOSIS — Z125 Encounter for screening for malignant neoplasm of prostate: Secondary | ICD-10-CM

## 2018-11-06 LAB — HEPATIC FUNCTION PANEL
ALT: 15 U/L (ref 0–53)
AST: 17 U/L (ref 0–37)
Albumin: 4.3 g/dL (ref 3.5–5.2)
Alkaline Phosphatase: 100 U/L (ref 39–117)
Bilirubin, Direct: 0.1 mg/dL (ref 0.0–0.3)
Total Bilirubin: 0.4 mg/dL (ref 0.2–1.2)
Total Protein: 7.2 g/dL (ref 6.0–8.3)

## 2018-11-06 LAB — LIPID PANEL
Cholesterol: 136 mg/dL (ref 0–200)
HDL: 37.2 mg/dL — ABNORMAL LOW (ref >39.00)
LDL Cholesterol: 74 mg/dL (ref 0–99)
NonHDL: 98.8
Total CHOL/HDL Ratio: 4
Triglycerides: 125 mg/dL (ref 0.0–149.0)
VLDL: 25 mg/dL (ref 0.0–40.0)

## 2018-11-06 LAB — BASIC METABOLIC PANEL
BUN: 15 mg/dL (ref 6–23)
CO2: 26 mEq/L (ref 19–32)
Calcium: 9.4 mg/dL (ref 8.4–10.5)
Chloride: 104 mEq/L (ref 96–112)
Creatinine, Ser: 1.08 mg/dL (ref 0.40–1.50)
GFR: 67.85 mL/min (ref >60.00)
Glucose, Bld: 122 mg/dL — ABNORMAL HIGH (ref 70–99)
Potassium: 4.3 mEq/L (ref 3.5–5.1)
Sodium: 139 mEq/L (ref 135–145)

## 2018-11-06 LAB — TSH: TSH: 4.8 u[IU]/mL — ABNORMAL HIGH (ref 0.35–4.50)

## 2018-11-06 LAB — PSA: PSA: 0.8 ng/mL (ref 0.10–4.00)

## 2018-11-06 NOTE — Progress Notes (Signed)
[facility] - 68 y.o. male MRN [id number]  Date of birth: 19-Apr-1950  Subjective Chief Complaint  Patient presents with  . Annual Exam    HPI Edward Hawkins is a 68 y.o. male here today for annual exam.  He was recently seen in August for follow up of his diabetes, BP elevated at that time as well.  BP is much better controlled today.  He is due for prevnar.  He reports L knee continues to give him problems.  Has had partial knee replacement and bakers cyst drained multiple times.  He takes tramadol for pain control, denies side effects.  L shoulder pain is improving.  He is working on dietary and exercise changes to better control his blood sugars.    Review of Systems  Constitutional: Negative for chills, fever, malaise/fatigue and weight loss.  HENT: Negative for congestion, ear pain and sore throat.   Eyes: Negative for blurred vision, double vision and pain.  Respiratory: Negative for cough and shortness of breath.   Cardiovascular: Negative for chest pain and palpitations.  Gastrointestinal: Negative for abdominal pain, blood in stool, constipation, heartburn and nausea.  Genitourinary: Negative for dysuria and urgency.  Musculoskeletal: Positive for joint pain. Negative for myalgias.  Neurological: Negative for dizziness and headaches.  Endo/Heme/Allergies: Does not bruise/bleed easily.  Psychiatric/Behavioral: Negative for depression. The patient is not nervous/anxious and does not have insomnia.     No Known Allergies  Past Medical History:  Diagnosis Date  . Arthritis   . ASTHMA 10/08/2006  . Asthma    last flareup more than 10 yrs ago.  . Cataract    extractions  . Colon polyps    tubular adenomas  . CVA 10/10/2005  . DIABETES MELLITUS, TYPE II 10/08/2006  . Diverticulosis   . HYPERLIPIDEMIA 10/08/2006  . HYPERTENSION 10/08/2006  . Primary localized osteoarthritis of left knee 12/28/2016  . RECTAL BLEEDING 09/15/2009  . S/P LASIK surgery of both eyes   .  Seasonal allergies     Past Surgical History:  Procedure Laterality Date  . CATARACT EXTRACTION, BILATERAL    . COLONOSCOPY    . EYE SURGERY    . FRACTURE SURGERY    . KNEE SURGERY     left knee x2  . PARTIAL KNEE ARTHROPLASTY Left 12/28/2016   Procedure: LEFT UNICOMPARTMENTAL KNEE;  Surgeon: Marchia Bond, MD;  Location: [facility];  Service: Orthopedics;  Laterality: Left;  . RADIOLOGY WITH ANESTHESIA N/A 10/28/2016   Procedure: MRI - CERVICAL SPINE WITHOUT;  Surgeon: Radiologist, Medication, MD;  Location: [facility];  Service: Radiology;  Laterality: N/A;  . Stress Cardiolite  11/05/2005  . TRANSTHORACIC ECHOCARDIOGRAM  10/12/2005    Social History   Socioeconomic History  . Marital status: Single    Spouse name: Not on file  . Number of children: Not on file  . Years of education: Not on file  . Highest education level: Not on file  Occupational History  . Occupation: Works at [employer]  . Financial resource strain: Not on file  . Food insecurity    Worry: Not on file    Inability: Not on file  . Transportation needs    Medical: Not on file    Non-medical: Not on file  Tobacco Use  . Smoking status: Never Smoker  . Smokeless tobacco: Never Used  Substance and Sexual Activity  . Alcohol use: No  . Drug use: No  . Sexual activity: Not on file  Lifestyle  .  Physical activity    Days per week: Not on file    Minutes per session: Not on file  . Stress: Not on file  Relationships  . Social Herbalist on phone: Not on file    Gets together: Not on file    Attends religious service: Not on file    Active member of club or organization: Not on file    Attends meetings of clubs or organizations: Not on file    Relationship status: Not on file  Other Topics Concern  . Not on file  Social History Narrative  . Not on file    Family History  Problem Relation Age of Onset  . Cancer Brother        Lung Cancer  . Diabetes Mother   . Diabetes Father    . Cancer Daughter        Breast Cancer  . Colon cancer Neg Hx     Health Maintenance  Topic Date Due  . PNA vac Low Risk Adult (2 of 2 - PCV13) 10/02/2016  . OPHTHALMOLOGY EXAM  09/09/2018  . FOOT EXAM  11/02/2018  . HEMOGLOBIN A1C  04/08/2019  . COLONOSCOPY  12/10/2020  . TETANUS/TDAP  10/02/2023  . INFLUENZA VACCINE  Completed  . Hepatitis C Screening  Completed    ----------------------------------------------------------------------------------------------------------------------------------------------------------------------------------------------------------------- Physical Exam BP 122/66 (BP Location: Right Arm)   Pulse 100   Temp 97.9 F (36.6 C)   Resp 18   Ht 5\' 11"  (1.803 m)   Wt 255 lb 6.4 oz (115.8 kg)   SpO2 99%   BMI 35.62 kg/m   Physical Exam Constitutional:      General: He is not in acute distress. HENT:     Head: Normocephalic and atraumatic.     Right Ear: External ear normal.     Left Ear: External ear normal.  Eyes:     General: No scleral icterus. Neck:     Musculoskeletal: Normal range of motion.     Thyroid: No thyromegaly.  Cardiovascular:     Rate and Rhythm: Normal rate and regular rhythm.     Heart sounds: Normal heart sounds.  Pulmonary:     Effort: Pulmonary effort is normal.     Breath sounds: Normal breath sounds.  Abdominal:     General: Bowel sounds are normal. There is no distension.     Palpations: Abdomen is soft.     Tenderness: There is no abdominal tenderness. There is no guarding.  Lymphadenopathy:     Cervical: No cervical adenopathy.  Skin:    General: Skin is warm and dry.     Findings: No rash.  Neurological:     Mental Status: He is alert and oriented to person, place, and time.     Cranial Nerves: No cranial nerve deficit.     Motor: No abnormal muscle tone.  Psychiatric:        Behavior: Behavior normal.    Diabetic Foot Exam - Simple   Simple Foot Form Diabetic Foot exam was performed with the  following findings: Yes 11/06/2018  9:48 AM  Visual Inspection No deformities, no ulcerations, no other skin breakdown bilaterally: Yes Sensation Testing Intact to touch and monofilament testing bilaterally: Yes Pulse Check Posterior Tibialis and Dorsalis pulse intact bilaterally: Yes Comments      ------------------------------------------------------------------------------------------------------------------------------------------------------------------------------------------------------------------- Assessment and Plan  Routine general medical examination at a health care facility -Well adult -Chronic conditions reviewed and stable.  Orders Placed This Encounter  Procedures  . Pneumococcal conjugate vaccine 13-valent  . Basic Metabolic Panel (BMET)  . Lipid panel  . TSH  . PSA  . Hepatic function panel  Screening: PSA Immunizations: prevnar 13 Anticipatory guidance/Risk factor reduction:  Continue to work on healthy lifestyle changes.  Additional recommendations per AVS.

## 2018-11-06 NOTE — Patient Instructions (Signed)
Preventive Care 68 Years and Older, Male Preventive care refers to lifestyle choices and visits with your health care provider that can promote health and wellness. This includes:  A yearly physical exam. This is also called an annual well check.  Regular dental and eye exams.  Immunizations.  Screening for certain conditions.  Healthy lifestyle choices, such as diet and exercise. What can I expect for my preventive care visit? Physical exam Your health care provider will check:  Height and weight. These may be used to calculate body mass index (BMI), which is a measurement that tells if you are at a healthy weight.  Heart rate and blood pressure.  Your skin for abnormal spots. Counseling Your health care provider may ask you questions about:  Alcohol, tobacco, and drug use.  Emotional well-being.  Home and relationship well-being.  Sexual activity.  Eating habits.  History of falls.  Memory and ability to understand (cognition).  Work and work Statistician. What immunizations do I need?  Influenza (flu) vaccine  This is recommended every year. Tetanus, diphtheria, and pertussis (Tdap) vaccine  You may need a Td booster every 10 years. Varicella (chickenpox) vaccine  You may need this vaccine if you have not already been vaccinated. Zoster (shingles) vaccine  You may need this after age 50. Pneumococcal conjugate (PCV13) vaccine  One dose is recommended after age 24. Pneumococcal polysaccharide (PPSV23) vaccine  One dose is recommended after age 33. Measles, mumps, and rubella (MMR) vaccine  You may need at least one dose of MMR if you were born in 1957 or later. You may also need a second dose. Meningococcal conjugate (MenACWY) vaccine  You may need this if you have certain conditions. Hepatitis A vaccine  You may need this if you have certain conditions or if you travel or work in places where you may be exposed to hepatitis A. Hepatitis B vaccine   You may need this if you have certain conditions or if you travel or work in places where you may be exposed to hepatitis B. Haemophilus influenzae type b (Hib) vaccine  You may need this if you have certain conditions. You may receive vaccines as individual doses or as more than one vaccine together in one shot (combination vaccines). Talk with your health care provider about the risks and benefits of combination vaccines. What tests do I need? Blood tests  Lipid and cholesterol levels. These may be checked every 5 years, or more frequently depending on your overall health.  Hepatitis C test.  Hepatitis B test. Screening  Lung cancer screening. You may have this screening every year starting at age 74 if you have a 30-pack-year history of smoking and currently smoke or have quit within the past 15 years.  Colorectal cancer screening. All adults should have this screening starting at age 57 and continuing until age 54. Your health care provider may recommend screening at age 47 if you are at increased risk. You will have tests every 1-10 years, depending on your results and the type of screening test.  Prostate cancer screening. Recommendations will vary depending on your family history and other risks.  Diabetes screening. This is done by checking your blood sugar (glucose) after you have not eaten for a while (fasting). You may have this done every 1-3 years.  Abdominal aortic aneurysm (AAA) screening. You may need this if you are a current or former smoker.  Sexually transmitted disease (STD) testing. Follow these instructions at home: Eating and drinking  Eat  a diet that includes fresh fruits and vegetables, whole grains, lean protein, and low-fat dairy products. Limit your intake of foods with high amounts of sugar, saturated fats, and salt.  Take vitamin and mineral supplements as recommended by your health care provider.  Do not drink alcohol if your health care provider  tells you not to drink.  If you drink alcohol: ? Limit how much you have to 0-2 drinks a day. ? Be aware of how much alcohol is in your drink. In the [place] one drink equals one 12 oz bottle of beer (355 mL), one 5 oz glass of wine (148 mL), or one 1 oz glass of hard liquor (44 mL). Lifestyle  Take daily care of your teeth and gums.  Stay active. Exercise for at least 30 minutes on 5 or more days each week.  Do not use any products that contain nicotine or tobacco, such as cigarettes, e-cigarettes, and chewing tobacco. If you need help quitting, ask your health care provider.  If you are sexually active, practice safe sex. Use a condom or other form of protection to prevent STIs (sexually transmitted infections).  Talk with your health care provider about taking a low-dose aspirin or statin. What's next?  Visit your health care provider once a year for a well check visit.  Ask your health care provider how often you should have your eyes and teeth checked.  Stay up to date on all vaccines. This information is not intended to replace advice given to you by your health care provider. Make sure you discuss any questions you have with your health care provider. Document Released: 02/21/2015 Document Revised: 01/19/2018 Document Reviewed: 01/19/2018 Elsevier Patient Education  2020 Elsevier Inc.  

## 2018-11-06 NOTE — Assessment & Plan Note (Signed)
-  Well adult -Chronic conditions reviewed and stable.  Orders Placed This Encounter  Procedures  . Pneumococcal conjugate vaccine 13-valent  . Basic Metabolic Panel (BMET)  . Lipid panel  . TSH  . PSA  . Hepatic function panel  Screening: PSA Immunizations: prevnar 13 Anticipatory guidance/Risk factor reduction:  Continue to work on healthy lifestyle changes.  Additional recommendations per AVS.

## 2018-11-10 ENCOUNTER — Other Ambulatory Visit: Payer: Self-pay | Admitting: Family Medicine

## 2018-11-10 DIAGNOSIS — G8929 Other chronic pain: Secondary | ICD-10-CM

## 2018-11-10 MED ORDER — TRAMADOL HCL 50 MG PO TABS: 50.0000 mg | ORAL_TABLET | Freq: Every evening | ORAL | 2 refills | Status: DC | PRN

## 2018-11-10 NOTE — Telephone Encounter (Signed)
Medication Refill - Medication: traMADol (ULTRAM) 50 MG tablet    Preferred Pharmacy (with phone number or street name):  [facility], [place]. [phone] (Phone) [phone] (Fax)

## 2018-11-10 NOTE — Telephone Encounter (Signed)
Requested medication (s) are due for refill today: yes  Requested medication (s) are on the active medication list: yes  Last refill:  Review for refill  Future visit scheduled: no  Notes to clinic:  Refill cannot be delegated    Requested Prescriptions  Pending Prescriptions Disp Refills   traMADol (ULTRAM) 50 MG tablet 30 tablet 2    Sig: Take 1 tablet (50 mg total) by mouth at bedtime as needed.     Not Delegated - Analgesics:  Opioid Agonists Failed - 11/10/2018  8:19 AM      Failed - This refill cannot be delegated      Failed - Urine Drug Screen completed in last 360 days.      Passed - Valid encounter within last 6 months    Recent Outpatient Visits          4 days ago Well adult exam   [facility], [place], DO   1 month ago Need for influenza vaccination   [facility], [place], DO   1 month ago Viral upper respiratory tract infection   [facility] [place], [place]   1 month ago Viral upper respiratory tract infection   [facility] [place], [place], DO   5 months ago Essential hypertension   [facility], [place], [place]

## 2018-11-13 ENCOUNTER — Other Ambulatory Visit: Payer: Self-pay | Admitting: Family Medicine

## 2018-11-13 DIAGNOSIS — R7989 Other specified abnormal findings of blood chemistry: Secondary | ICD-10-CM

## 2018-11-13 NOTE — Progress Notes (Signed)
-  TSH is slightly elevated, I would  like to recheck this.  Orders entered.  Please schedule for lab visit.  -Other labs look good!  Thanks!

## 2018-11-14 ENCOUNTER — Telehealth: Payer: Self-pay

## 2018-11-14 NOTE — Telephone Encounter (Signed)

## 2018-11-15 ENCOUNTER — Other Ambulatory Visit (INDEPENDENT_AMBULATORY_CARE_PROVIDER_SITE_OTHER): Payer: PPO

## 2018-11-15 DIAGNOSIS — R7989 Other specified abnormal findings of blood chemistry: Secondary | ICD-10-CM

## 2018-11-15 LAB — TSH: TSH: 5.19 u[IU]/mL — ABNORMAL HIGH (ref 0.35–4.50)

## 2018-11-15 LAB — T4, FREE: Free T4: 0.84 ng/dL (ref 0.60–1.60)

## 2018-11-27 ENCOUNTER — Other Ambulatory Visit: Payer: Self-pay | Admitting: Family Medicine

## 2018-11-27 DIAGNOSIS — I10 Essential (primary) hypertension: Secondary | ICD-10-CM

## 2018-11-27 NOTE — Progress Notes (Signed)
Repeat TSH is still a little high but free t4 levels are normal.  Nothing needed at this time but we need to recheck again in 6 months.

## 2018-12-26 ENCOUNTER — Other Ambulatory Visit: Payer: Self-pay | Admitting: Family Medicine

## 2018-12-26 DIAGNOSIS — J4520 Mild intermittent asthma, uncomplicated: Secondary | ICD-10-CM

## 2019-01-18 ENCOUNTER — Telehealth: Payer: Self-pay | Admitting: Family Medicine

## 2019-01-18 NOTE — Telephone Encounter (Signed)
Medication Refill - Medication: montelukast (SINGULAIR) 10 MG tablet Pt has 1 left/ please advise if this can be sent asap   Has the patient contacted their pharmacy? No. (Agent: If no, request that the patient contact the pharmacy for the refill.) (Agent: If yes, when and what did the pharmacy advise?)  Preferred Pharmacy (with phone number or street name):  [facility], [place]. Phone:  [phone]  Fax:  [phone]       Agent: Please be advised that RX refills may take up to 3 business days. We ask that you follow-up with your pharmacy.

## 2019-01-18 NOTE — Telephone Encounter (Signed)
Dr. Loletha Grayer please advise in Dr. Zigmund Daniel absence this afternoon (pt has 1 pill left). We have not send in this rx before.

## 2019-01-19 MED ORDER — MONTELUKAST SODIUM 10 MG PO TABS: 10.0000 mg | ORAL_TABLET | Freq: Every day | ORAL | 0 refills | Status: DC

## 2019-01-19 NOTE — Telephone Encounter (Signed)
90 day supply sent to pharm on file

## 2019-01-19 NOTE — Telephone Encounter (Signed)
Pt is aware.  

## 2019-02-07 ENCOUNTER — Other Ambulatory Visit: Payer: Self-pay | Admitting: Family Medicine

## 2019-02-07 DIAGNOSIS — G8929 Other chronic pain: Secondary | ICD-10-CM

## 2019-02-27 ENCOUNTER — Other Ambulatory Visit: Payer: Self-pay | Admitting: Family Medicine

## 2019-02-27 ENCOUNTER — Ambulatory Visit: Payer: PPO | Attending: Internal Medicine

## 2019-02-27 DIAGNOSIS — Z23 Encounter for immunization: Secondary | ICD-10-CM

## 2019-02-27 DIAGNOSIS — J4520 Mild intermittent asthma, uncomplicated: Secondary | ICD-10-CM

## 2019-02-27 DIAGNOSIS — I10 Essential (primary) hypertension: Secondary | ICD-10-CM

## 2019-02-27 NOTE — Progress Notes (Signed)
   Covid-19 Vaccination Clinic  Name:  Edward Hawkins    MRN: [id number] DOB: 1951/01/24  02/27/2019  Mr. [name] was observed post Covid-19 immunization for 15 minutes without incidence. He was provided with Vaccine Information Sheet and instruction to access the V-Safe system.   Mr. [name] was instructed to call 911 with any severe reactions post vaccine: Marland Kitchen Difficulty breathing  . Swelling of your face and throat  . A fast heartbeat  . A bad rash all over your body  . Dizziness and weakness    Immunizations Administered    Name Date Dose VIS Date Route   Pfizer COVID-19 Vaccine 02/27/2019  6:52 PM 0.3 mL 01/19/2019 Intramuscular   Manufacturer: [place]   Lot: [id number]   [place]: [id number]

## 2019-03-07 ENCOUNTER — Other Ambulatory Visit: Payer: Self-pay | Admitting: Family Medicine

## 2019-03-07 DIAGNOSIS — G8929 Other chronic pain: Secondary | ICD-10-CM

## 2019-03-07 DIAGNOSIS — M25562 Pain in left knee: Secondary | ICD-10-CM

## 2019-03-07 NOTE — Telephone Encounter (Signed)
CM-Plz see refill req/thx dmf 

## 2019-03-18 ENCOUNTER — Ambulatory Visit: Payer: PPO | Attending: Internal Medicine

## 2019-03-18 DIAGNOSIS — Z23 Encounter for immunization: Secondary | ICD-10-CM | POA: Insufficient documentation

## 2019-03-18 NOTE — Progress Notes (Signed)
   Covid-19 Vaccination Clinic  Name:  Edward Hawkins    MRN: [id number] DOB: [birth year]  03/18/2019  [name] was observed post Covid-19 immunization for 15 minutes without incidence. He was provided with Vaccine Information Sheet and instruction to access the V-Safe system.   [name] was instructed to call 911 with any severe reactions post vaccine: Marland Kitchen Difficulty breathing  . Swelling of your face and throat  . A fast heartbeat  . A bad rash all over your body  . Dizziness and weakness    Immunizations Administered    Name Date Dose VIS Date Route   Pfizer COVID-19 Vaccine 03/18/2019  5:24 PM 0.3 mL 01/19/2019 Intramuscular   Manufacturer: [place]   Lot: [id number]   [place]: [id number]

## 2019-03-23 DIAGNOSIS — T8484XA Pain due to internal orthopedic prosthetic devices, implants and grafts, initial encounter: Secondary | ICD-10-CM | POA: Diagnosis not present

## 2019-03-30 ENCOUNTER — Other Ambulatory Visit: Payer: Self-pay | Admitting: Family Medicine

## 2019-04-02 NOTE — Telephone Encounter (Signed)
90 days supply sent, LVM for the pt to call back, need to est care with new PCP.

## 2019-04-04 ENCOUNTER — Encounter: Payer: Self-pay | Admitting: Family Medicine

## 2019-04-04 ENCOUNTER — Other Ambulatory Visit: Payer: Self-pay

## 2019-04-04 ENCOUNTER — Ambulatory Visit (INDEPENDENT_AMBULATORY_CARE_PROVIDER_SITE_OTHER): Payer: PPO | Admitting: Family Medicine

## 2019-04-04 VITALS — Temp 98.2°F | Ht 71.0 in | Wt 254.0 lb

## 2019-04-04 DIAGNOSIS — M25562 Pain in left knee: Secondary | ICD-10-CM

## 2019-04-04 DIAGNOSIS — G8929 Other chronic pain: Secondary | ICD-10-CM

## 2019-04-04 DIAGNOSIS — R7989 Other specified abnormal findings of blood chemistry: Secondary | ICD-10-CM | POA: Insufficient documentation

## 2019-04-04 MED ORDER — TRAMADOL HCL 50 MG PO TABS: 50.0000 mg | ORAL_TABLET | Freq: Every evening | ORAL | 1 refills | Status: DC | PRN

## 2019-04-04 NOTE — Progress Notes (Signed)
Established Patient Office Visit  Subjective:  Patient ID: Edward Hawkins, male    DOB: 01-Dec-1950  Age: 69 y.o. MRN: [id number]  CC:  Chief Complaint  Patient presents with  . Follow-up    Refill on medications     HPI [name] presents for follow-up of his chronic knee pain.  He has had a partial knee replacement in this knee and has a Baker's cyst as well.  According to his orthopedic physician there is no further surgical treatment available to him.  He continues to work as a Presenter, broadcasting at [employer] and has been helping with the vaccination program.  He sometimes walks 6 miles on his job.  He has been taking the tramadol before bed with relief of his pain.  He sees Dr. Loanne Drilling for his diabetic care.  He will see me for his hypertension elevated cholesterol and recently elevated TSH.  He has follow-up scheduled with me in March.  Past Medical History:  Diagnosis Date  . Arthritis   . ASTHMA 10/08/2006  . Asthma    last flareup more than 10 yrs ago.  . Cataract    extractions  . Colon polyps    tubular adenomas  . CVA 10/10/2005  . DIABETES MELLITUS, TYPE II 10/08/2006  . Diverticulosis   . HYPERLIPIDEMIA 10/08/2006  . HYPERTENSION 10/08/2006  . Primary localized osteoarthritis of left knee 12/28/2016  . RECTAL BLEEDING 09/15/2009  . S/P LASIK surgery of both eyes   . Seasonal allergies     Past Surgical History:  Procedure Laterality Date  . CATARACT EXTRACTION, BILATERAL    . COLONOSCOPY    . EYE SURGERY    . FRACTURE SURGERY    . KNEE SURGERY     left knee x2  . PARTIAL KNEE ARTHROPLASTY Left 12/28/2016   Procedure: LEFT UNICOMPARTMENTAL KNEE;  Surgeon: Marchia Bond, MD;  Location: [facility];  Service: Orthopedics;  Laterality: Left;  . RADIOLOGY WITH ANESTHESIA N/A 10/28/2016   Procedure: MRI - CERVICAL SPINE WITHOUT;  Surgeon: Radiologist, Medication, MD;  Location: [place];  Service: Radiology;  Laterality: N/A;  . Stress Cardiolite  11/05/2005  .  TRANSTHORACIC ECHOCARDIOGRAM  10/12/2005    Family History  Problem Relation Age of Onset  . Cancer Brother        Lung Cancer  . Diabetes Mother   . Diabetes Father   . Cancer Daughter        Breast Cancer  . Colon cancer Neg Hx     Social History   Socioeconomic History  . Marital status: Single    Spouse name: Not on file  . Number of children: Not on file  . Years of education: Not on file  . Highest education level: Not on file  Occupational History  . Occupation: Works at [employer]  . Smoking status: Never Smoker  . Smokeless tobacco: Never Used  Substance and Sexual Activity  . Alcohol use: No  . Drug use: No  . Sexual activity: Not on file  Other Topics Concern  . Not on file  Social History Narrative  . Not on file   Social Determinants of Health   Financial Resource Strain:   . Difficulty of Paying Living Expenses: Not on file  Food Insecurity:   . Worried About Charity fundraiser in the Last Year: Not on file  . Ran Out of Food in the Last Year: Not on file  Transportation Needs:   .  Lack of Transportation (Medical): Not on file  . Lack of Transportation (Non-Medical): Not on file  Physical Activity:   . Days of Exercise per Week: Not on file  . Minutes of Exercise per Session: Not on file  Stress:   . Feeling of Stress : Not on file  Social Connections:   . Frequency of Communication with Friends and Family: Not on file  . Frequency of Social Gatherings with Friends and Family: Not on file  . Attends Religious Services: Not on file  . Active Member of Clubs or Organizations: Not on file  . Attends Archivist Meetings: Not on file  . Marital Status: Not on file  Intimate Partner Violence:   . Fear of Current or Ex-Partner: Not on file  . Emotionally Abused: Not on file  . Physically Abused: Not on file  . Sexually Abused: Not on file    Outpatient Medications Prior to Visit  Medication Sig Dispense Refill  . aspirin  325 MG tablet Take 325 mg by mouth daily.    Marland Kitchen atorvastatin (LIPITOR) 20 MG tablet Take 1 tablet (20 mg total) by mouth daily. 90 tablet 3  . Cholecalciferol (EQL VITAMIN D3) 2000 units CAPS Take 2,000 Units daily by mouth.    . diltiazem (CARDIZEM) 60 MG tablet Take 1 tablet by mouth twice daily 180 tablet 0  . fish oil-omega-3 fatty acids 1000 MG capsule Take 1 g by mouth daily.    . Fluticasone-Salmeterol (ADVAIR) 250-50 MCG/DOSE AEPB INHALE 1 DOSE BY MOUTH ONCE DAILY 60 each 0  . losartan (COZAAR) 100 MG tablet Take 1 tablet (100 mg total) by mouth daily. 90 tablet 3  . montelukast (SINGULAIR) 10 MG tablet Take 1 tablet (10 mg total) by mouth at bedtime. 90 tablet 0  . Multiple Vitamin (MULTIVITAMIN) tablet Take 1 tablet by mouth daily.     . naproxen (NAPROSYN) 500 MG tablet Take 1 tablet (500 mg total) by mouth 2 (two) times daily with a meal. 30 tablet 0  . pioglitazone (ACTOS) 15 MG tablet Take 1 tablet by mouth once daily 90 tablet 0  . traMADol (ULTRAM) 50 MG tablet TAKE 1 TO 2 TABLETS BY MOUTH AT BEDTIME AS NEEDED 45 tablet 0  . metFORMIN (GLUCOPHAGE XR) 500 MG 24 hr tablet Take 2 tablets (1,000 mg total) by mouth daily with breakfast. 180 tablet 1  . tizanidine (ZANAFLEX) 2 MG capsule Take 1-2 capsules (2-4 mg total) by mouth 3 (three) times daily as needed for muscle spasms. (Patient not taking: Reported on 04/04/2019) 60 capsule 0   No facility-administered medications prior to visit.    No Known Allergies  ROS Review of Systems  Constitutional: Negative.   Respiratory: Negative.   Cardiovascular: Negative.   Endocrine: Negative for polyphagia and polyuria.  Musculoskeletal: Positive for arthralgias and gait problem.  Psychiatric/Behavioral: Negative.       Objective:    Physical Exam  Constitutional: He is oriented to person, place, and time. He appears well-developed and well-nourished. No distress.  HENT:  Head: Normocephalic and atraumatic.  Pulmonary/Chest:  Effort normal.  Neurological: He is alert and oriented to person, place, and time.  Skin: He is not diaphoretic.  Psychiatric: He has a normal mood and affect. His behavior is normal.    Temp 98.2 F (36.8 C) (Tympanic) Comment: per pt  Ht 5\' 11"  (1.803 m)   Wt 254 lb (115.2 kg) Comment: per pt  BMI 35.43 kg/m  Wt Readings from Last  3 Encounters:  04/04/19 254 lb (115.2 kg)  11/06/18 255 lb 6.4 oz (115.8 kg)  10/09/18 258 lb 9.6 oz (117.3 kg)     There are no preventive care reminders to display for this patient.  There are no preventive care reminders to display for this patient.  Lab Results  Component Value Date   TSH 5.19 (H) 11/15/2018   Lab Results  Component Value Date   WBC 7.8 10/09/2018   HGB 14.2 10/09/2018   HCT 42.4 10/09/2018   MCV 98.1 10/09/2018   PLT 305.0 10/09/2018   Lab Results  Component Value Date   NA 139 11/06/2018   K 4.3 11/06/2018   CO2 26 11/06/2018   GLUCOSE 122 (H) 11/06/2018   BUN 15 11/06/2018   CREATININE 1.08 11/06/2018   BILITOT 0.4 11/06/2018   ALKPHOS 100 11/06/2018   AST 17 11/06/2018   ALT 15 11/06/2018   PROT 7.2 11/06/2018   ALBUMIN 4.3 11/06/2018   CALCIUM 9.4 11/06/2018   ANIONGAP 6 12/29/2016   GFR 67.85 11/06/2018   Lab Results  Component Value Date   CHOL 136 11/06/2018   Lab Results  Component Value Date   HDL 37.20 (L) 11/06/2018   Lab Results  Component Value Date   LDLCALC 74 11/06/2018   Lab Results  Component Value Date   TRIG 125.0 11/06/2018   Lab Results  Component Value Date   CHOLHDL 4 11/06/2018   Lab Results  Component Value Date   HGBA1C 6.5 10/09/2018      Assessment & Plan:   Problem List Items Addressed This Visit      Other   Chronic pain of left knee - Primary   Relevant Medications   traMADol (ULTRAM) 50 MG tablet      Meds ordered this encounter  Medications  . traMADol (ULTRAM) 50 MG tablet    Sig: Take 1-2 tablets (50-100 mg total) by mouth at bedtime as  needed.    Dispense:  45 tablet    Refill:  1    Follow-up: No follow-ups on file.    Libby Maw, MD   Virtual Visit via Video Note  I connected with Randell Loop on 04/04/19 at 11:30 AM EST by a video enabled telemedicine application and verified that I am speaking with the correct person using two identifiers.  Location: Patient: home Provider:    I discussed the limitations of evaluation and management by telemedicine and the availability of in person appointments. The patient expressed understanding and agreed to proceed.  History of Present Illness:    Observations/Objective:   Assessment and Plan:   Follow Up Instructions:    I discussed the assessment and treatment plan with the patient. The patient was provided an opportunity to ask questions and all were answered. The patient agreed with the plan and demonstrated an understanding of the instructions.   The patient was advised to call back or seek an in-person evaluation if the symptoms worsen or if the condition fails to improve as anticipated.  I provided 20 minutes of non-face-to-face time during this encounter.   Libby Maw, MD

## 2019-04-11 ENCOUNTER — Ambulatory Visit: Payer: PPO | Admitting: Family Medicine

## 2019-04-16 ENCOUNTER — Other Ambulatory Visit: Payer: Self-pay

## 2019-04-16 ENCOUNTER — Telehealth: Payer: Self-pay | Admitting: Family Medicine

## 2019-04-16 MED ORDER — METFORMIN HCL ER 500 MG PO TB24: 1000.0000 mg | ORAL_TABLET | Freq: Every day | ORAL | 1 refills | Status: DC

## 2019-04-16 MED ORDER — MONTELUKAST SODIUM 10 MG PO TABS: 10.0000 mg | ORAL_TABLET | Freq: Every day | ORAL | 0 refills | Status: DC

## 2019-04-16 NOTE — Telephone Encounter (Signed)
Rx sent in pt aware 

## 2019-04-16 NOTE — Telephone Encounter (Signed)
Patient is needing a Rx refill for singulair and metformin. Patient will be out of medication before he comes in for [facility] appt to Dr. Ethelene Hal. Please call patient once Rx has been sent in.

## 2019-04-23 ENCOUNTER — Other Ambulatory Visit: Payer: Self-pay

## 2019-04-23 ENCOUNTER — Ambulatory Visit (INDEPENDENT_AMBULATORY_CARE_PROVIDER_SITE_OTHER): Payer: PPO | Admitting: Family Medicine

## 2019-04-23 ENCOUNTER — Encounter: Payer: Self-pay | Admitting: Family Medicine

## 2019-04-23 VITALS — BP 142/72 | HR 77 | Temp 97.2°F | Ht 71.0 in | Wt 257.8 lb

## 2019-04-23 DIAGNOSIS — G8929 Other chronic pain: Secondary | ICD-10-CM | POA: Diagnosis not present

## 2019-04-23 DIAGNOSIS — M25562 Pain in left knee: Secondary | ICD-10-CM | POA: Diagnosis not present

## 2019-04-23 DIAGNOSIS — R7989 Other specified abnormal findings of blood chemistry: Secondary | ICD-10-CM

## 2019-04-23 DIAGNOSIS — E785 Hyperlipidemia, unspecified: Secondary | ICD-10-CM

## 2019-04-23 DIAGNOSIS — I10 Essential (primary) hypertension: Secondary | ICD-10-CM | POA: Diagnosis not present

## 2019-04-23 DIAGNOSIS — J4520 Mild intermittent asthma, uncomplicated: Secondary | ICD-10-CM | POA: Diagnosis not present

## 2019-04-23 DIAGNOSIS — E119 Type 2 diabetes mellitus without complications: Secondary | ICD-10-CM

## 2019-04-23 MED ORDER — DILTIAZEM HCL ER COATED BEADS 180 MG PO TB24: 180.0000 mg | ORAL_TABLET | Freq: Every day | ORAL | 1 refills | Status: DC

## 2019-04-23 NOTE — Progress Notes (Signed)
Established Patient Office Visit  Subjective:  Patient ID: Edward Hawkins, male    DOB: Jul 25, 1950  Age: 69 y.o. MRN: [id number]  CC:  Chief Complaint  Patient presents with  . Transitions Of Care    TOC from Dr. Zigmund Daniel, no concerns.     HPI Edward Hawkins presents for to meet his new doctor about transition of care.  We have met previously via televisit only.  He is here to follow-up for his hypertension, elevated cholesterol diabetes asthma and abnormal TSH.  Blood sugar blood pressures been running in the upper 140s on the diltiazem sixty twice daily with 100 mg of losartan daily.  Continues to take atorvastatin twenty daily for his cholesterol without issue.  Diabetes has been controlled with Metformin Actos.  Asthma has been well controlled without there and Singulair.  Uses tramadol as needed for chronic knee pain status post partial arthroplasty 7 years ago.  Past Medical History:  Diagnosis Date  . Arthritis   . ASTHMA 10/08/2006  . Asthma    last flareup more than 10 yrs ago.  . Cataract    extractions  . Colon polyps    tubular adenomas  . CVA 10/10/2005  . DIABETES MELLITUS, TYPE II 10/08/2006  . Diverticulosis   . HYPERLIPIDEMIA 10/08/2006  . HYPERTENSION 10/08/2006  . Primary localized osteoarthritis of left knee 12/28/2016  . RECTAL BLEEDING 09/15/2009  . S/P LASIK surgery of both eyes   . Seasonal allergies     Past Surgical History:  Procedure Laterality Date  . CATARACT EXTRACTION, BILATERAL    . COLONOSCOPY    . EYE SURGERY    . FRACTURE SURGERY    . KNEE SURGERY     left knee x2  . PARTIAL KNEE ARTHROPLASTY Left 12/28/2016   Procedure: LEFT UNICOMPARTMENTAL KNEE;  Surgeon: Marchia Bond, MD;  Location: [place];  Service: Orthopedics;  Laterality: Left;  . RADIOLOGY WITH ANESTHESIA N/A 10/28/2016   Procedure: MRI - CERVICAL SPINE WITHOUT;  Surgeon: Radiologist, Medication, MD;  Location: [place];  Service: Radiology;  Laterality: N/A;  . Stress  Cardiolite  11/05/2005  . TRANSTHORACIC ECHOCARDIOGRAM  10/12/2005    Family History  Problem Relation Age of Onset  . Cancer Brother        Lung Cancer  . Diabetes Mother   . Diabetes Father   . Cancer Daughter        Breast Cancer  . Colon cancer Neg Hx     Social History   Socioeconomic History  . Marital status: Single    Spouse name: Not on file  . Number of children: Not on file  . Years of education: Not on file  . Highest education level: Not on file  Occupational History  . Occupation: Works at [employer]  . Smoking status: Never Smoker  . Smokeless tobacco: Never Used  Substance and Sexual Activity  . Alcohol use: No  . Drug use: No  . Sexual activity: Not on file  Other Topics Concern  . Not on file  Social History Narrative  . Not on file   Social Determinants of Health   Financial Resource Strain:   . Difficulty of Paying Living Expenses:   Food Insecurity:   . Worried About Charity fundraiser in the Last Year:   . Arboriculturist in the Last Year:   Transportation Needs:   . Film/video editor (Medical):   Marland Kitchen Lack of Transportation (Non-Medical):  Physical Activity:   . Days of Exercise per Week:   . Minutes of Exercise per Session:   Stress:   . Feeling of Stress :   Social Connections:   . Frequency of Communication with Friends and Family:   . Frequency of Social Gatherings with Friends and Family:   . Attends Religious Services:   . Active Member of Clubs or Organizations:   . Attends Archivist Meetings:   Marland Kitchen Marital Status:   Intimate Partner Violence:   . Fear of Current or Ex-Partner:   . Emotionally Abused:   Marland Kitchen Physically Abused:   . Sexually Abused:     Outpatient Medications Prior to Visit  Medication Sig Dispense Refill  . aspirin 325 MG tablet Take 325 mg by mouth daily.    Marland Kitchen atorvastatin (LIPITOR) 20 MG tablet Take 1 tablet (20 mg total) by mouth daily. 90 tablet 3  . Cholecalciferol (EQL VITAMIN  D3) 2000 units CAPS Take 2,000 Units daily by mouth.    . fish oil-omega-3 fatty acids 1000 MG capsule Take 1 g by mouth daily.    . Fluticasone-Salmeterol (ADVAIR) 250-50 MCG/DOSE AEPB INHALE 1 DOSE BY MOUTH ONCE DAILY 60 each 0  . losartan (COZAAR) 100 MG tablet Take 1 tablet (100 mg total) by mouth daily. 90 tablet 3  . metFORMIN (GLUCOPHAGE XR) 500 MG 24 hr tablet Take 2 tablets (1,000 mg total) by mouth daily with breakfast. 180 tablet 1  . montelukast (SINGULAIR) 10 MG tablet Take 1 tablet (10 mg total) by mouth at bedtime. 90 tablet 0  . Multiple Vitamin (MULTIVITAMIN) tablet Take 1 tablet by mouth daily.     . pioglitazone (ACTOS) 15 MG tablet Take 1 tablet by mouth once daily 90 tablet 0  . traMADol (ULTRAM) 50 MG tablet Take 1-2 tablets (50-100 mg total) by mouth at bedtime as needed. 45 tablet 1  . diltiazem (CARDIZEM) 60 MG tablet Take 1 tablet by mouth twice daily 180 tablet 0  . naproxen (NAPROSYN) 500 MG tablet Take 1 tablet (500 mg total) by mouth 2 (two) times daily with a meal. (Patient not taking: Reported on 04/23/2019) 30 tablet 0  . tizanidine (ZANAFLEX) 2 MG capsule Take 1-2 capsules (2-4 mg total) by mouth 3 (three) times daily as needed for muscle spasms. (Patient not taking: Reported on 04/04/2019) 60 capsule 0   No facility-administered medications prior to visit.    No Known Allergies  ROS Review of Systems  Constitutional: Negative.   HENT: Negative.   Respiratory: Negative.   Cardiovascular: Negative.   Gastrointestinal: Negative.   Endocrine: Negative for polyphagia and polyuria.  Genitourinary: Negative.   Musculoskeletal: Positive for arthralgias.  Skin: Negative for pallor and rash.  Allergic/Immunologic: Negative for immunocompromised state.  Neurological: Negative for speech difficulty and light-headedness.  Hematological: Does not bruise/bleed easily.  Psychiatric/Behavioral: Negative.       Objective:    Physical Exam  Constitutional: He is  oriented to person, place, and time. He appears well-developed and well-nourished. No distress.  HENT:  Head: Normocephalic and atraumatic.  Right Ear: External ear normal.  Left Ear: External ear normal.  Eyes: Conjunctivae are normal. Right eye exhibits no discharge. Left eye exhibits no discharge. No scleral icterus.  Neck: No JVD present. No tracheal deviation present. No thyromegaly present.  Cardiovascular: Normal rate, regular rhythm and normal heart sounds.  Pulses:      Dorsalis pedis pulses are 1+ on the right side and 1+ on the  left side.       Posterior tibial pulses are 1+ on the right side and 1+ on the left side.  Pulmonary/Chest: Effort normal and breath sounds normal. No stridor.  Abdominal: Bowel sounds are normal.  Musculoskeletal:        General: No edema.  Lymphadenopathy:    He has no cervical adenopathy.  Neurological: He is alert and oriented to person, place, and time.  Skin: Skin is warm and dry. He is not diaphoretic.  Psychiatric: He has a normal mood and affect. His behavior is normal.   Diabetic Foot Exam - Simple   Simple Foot Form Diabetic Foot exam was performed with the following findings: Yes 04/23/2019  4:03 PM  Visual Inspection See comments: Yes Sensation Testing Intact to touch and monofilament testing bilaterally: Yes Pulse Check Posterior Tibialis and Dorsalis pulse intact bilaterally: Yes Comments Feet are cavus.     BP (!) 142/72   Pulse 77   Temp (!) 97.2 F (36.2 C) (Tympanic)   Ht 5' 11"  (1.803 m)   Wt 257 lb 12.8 oz (116.9 kg)   SpO2 96%   BMI 35.96 kg/m  Wt Readings from Last 3 Encounters:  04/23/19 257 lb 12.8 oz (116.9 kg)  04/04/19 254 lb (115.2 kg)  11/06/18 255 lb 6.4 oz (115.8 kg)     Health Maintenance Due  Topic Date Due  . HEMOGLOBIN A1C  04/08/2019    There are no preventive care reminders to display for this patient.  Lab Results  Component Value Date   TSH 5.19 (H) 11/15/2018   Lab Results    Component Value Date   WBC 7.8 10/09/2018   HGB 14.2 10/09/2018   HCT 42.4 10/09/2018   MCV 98.1 10/09/2018   PLT 305.0 10/09/2018   Lab Results  Component Value Date   NA 139 11/06/2018   K 4.3 11/06/2018   CO2 26 11/06/2018   GLUCOSE 122 (H) 11/06/2018   BUN 15 11/06/2018   CREATININE 1.08 11/06/2018   BILITOT 0.4 11/06/2018   ALKPHOS 100 11/06/2018   AST 17 11/06/2018   ALT 15 11/06/2018   PROT 7.2 11/06/2018   ALBUMIN 4.3 11/06/2018   CALCIUM 9.4 11/06/2018   ANIONGAP 6 12/29/2016   GFR 67.85 11/06/2018   Lab Results  Component Value Date   CHOL 136 11/06/2018   Lab Results  Component Value Date   HDL 37.20 (L) 11/06/2018   Lab Results  Component Value Date   LDLCALC 74 11/06/2018   Lab Results  Component Value Date   TRIG 125.0 11/06/2018   Lab Results  Component Value Date   CHOLHDL 4 11/06/2018   Lab Results  Component Value Date   HGBA1C 6.5 10/09/2018      Assessment & Plan:   Problem List Items Addressed This Visit      Cardiovascular and Mediastinum   Essential hypertension - Primary   Relevant Medications   diltiazem (CARDIZEM LA) 180 MG 24 hr tablet   Other Relevant Orders   Basic metabolic panel   CBC   Urinalysis, Routine w reflex microscopic   Microalbumin / creatinine urine ratio     Respiratory   Asthma     Endocrine   Diabetes ([place])   Relevant Orders   Hemoglobin A1c   Urinalysis, Routine w reflex microscopic   Microalbumin / creatinine urine ratio     Other   Dyslipidemia   Relevant Orders   LDL cholesterol, direct   Chronic pain  of left knee   Abnormal TSH   Relevant Orders   TSH      Meds ordered this encounter  Medications  . diltiazem (CARDIZEM LA) 180 MG 24 hr tablet    Sig: Take 1 tablet (180 mg total) by mouth daily.    Dispense:  90 tablet    Refill:  1    Follow-up: Return in about 3 months (around 07/24/2019).   Have switched from Cardizem sixty twice daily to Cardizem LA one eighty daily.   Patient will check and record blood pressures.  Continue other medicines as directed. Libby Maw, MD

## 2019-04-24 ENCOUNTER — Other Ambulatory Visit (INDEPENDENT_AMBULATORY_CARE_PROVIDER_SITE_OTHER): Payer: PPO

## 2019-04-24 DIAGNOSIS — I10 Essential (primary) hypertension: Secondary | ICD-10-CM | POA: Diagnosis not present

## 2019-04-24 DIAGNOSIS — E119 Type 2 diabetes mellitus without complications: Secondary | ICD-10-CM | POA: Diagnosis not present

## 2019-04-24 DIAGNOSIS — R7989 Other specified abnormal findings of blood chemistry: Secondary | ICD-10-CM | POA: Diagnosis not present

## 2019-04-24 DIAGNOSIS — E785 Hyperlipidemia, unspecified: Secondary | ICD-10-CM

## 2019-04-24 LAB — URINALYSIS, ROUTINE W REFLEX MICROSCOPIC
Bilirubin Urine: NEGATIVE
Hgb urine dipstick: NEGATIVE
Leukocytes,Ua: NEGATIVE
Nitrite: NEGATIVE
Specific Gravity, Urine: >=1.03 — AB (ref 1.000–1.030)
Total Protein, Urine: NEGATIVE
Urine Glucose: NEGATIVE
Urobilinogen, UA: 0.2 (ref 0.0–1.0)
pH: 5.5 (ref 5.0–8.0)

## 2019-04-24 LAB — MICROALBUMIN / CREATININE URINE RATIO
Creatinine,U: 234 mg/dL
Microalb Creat Ratio: 0.5 mg/g (ref 0.0–30.0)
Microalb, Ur: 1.2 mg/dL (ref 0.0–1.9)

## 2019-04-25 LAB — BASIC METABOLIC PANEL
BUN: 15 mg/dL (ref 6–23)
CO2: 28 mEq/L (ref 19–32)
Calcium: 9 mg/dL (ref 8.4–10.5)
Chloride: 107 mEq/L (ref 96–112)
Creatinine, Ser: 1.01 mg/dL (ref 0.40–1.50)
GFR: 73.2 mL/min (ref >60.00)
Glucose, Bld: 129 mg/dL — ABNORMAL HIGH (ref 70–99)
Potassium: 3.9 mEq/L (ref 3.5–5.1)
Sodium: 141 mEq/L (ref 135–145)

## 2019-04-25 LAB — CBC
HCT: 39.6 % (ref 39.0–52.0)
Hemoglobin: 12.9 g/dL — ABNORMAL LOW (ref 13.0–17.0)
MCHC: 32.7 g/dL (ref 30.0–36.0)
MCV: 99.7 fl (ref 78.0–100.0)
Platelets: 323 10*3/uL (ref 150.0–400.0)
RBC: 3.97 Mil/uL — ABNORMAL LOW (ref 4.22–5.81)
RDW: 13.6 % (ref 11.5–15.5)
WBC: 6.6 10*3/uL (ref 4.0–10.5)

## 2019-04-25 LAB — LDL CHOLESTEROL, DIRECT: Direct LDL: 84 mg/dL

## 2019-04-25 LAB — TSH: TSH: 3.19 u[IU]/mL (ref 0.35–4.50)

## 2019-04-25 LAB — HEMOGLOBIN A1C: Hgb A1c MFr Bld: 6.3 % (ref 4.6–6.5)

## 2019-04-26 ENCOUNTER — Other Ambulatory Visit: Payer: PPO

## 2019-04-30 ENCOUNTER — Other Ambulatory Visit: Payer: Self-pay | Admitting: Family Medicine

## 2019-04-30 DIAGNOSIS — J4520 Mild intermittent asthma, uncomplicated: Secondary | ICD-10-CM

## 2019-05-10 ENCOUNTER — Other Ambulatory Visit: Payer: Self-pay | Admitting: Family Medicine

## 2019-05-12 IMAGING — MR MR CERVICAL SPINE W/O CM
4 of 5 series · 19 of 48 positions shown · non-contrast
Comparison: None.

CLINICAL DATA: Neck and left shoulder pain.

EXAM:
MRI CERVICAL SPINE WITHOUT CONTRAST
TECHNIQUE: Multiplanar, multisequence MR imaging of the cervical spine was
performed. No intravenous contrast was administered.

[Series 2: T2 · sagittal · 3.0mm · 0.43mm/px · 6 of 16 slices shown (1 of 2)]
[im 1/16]
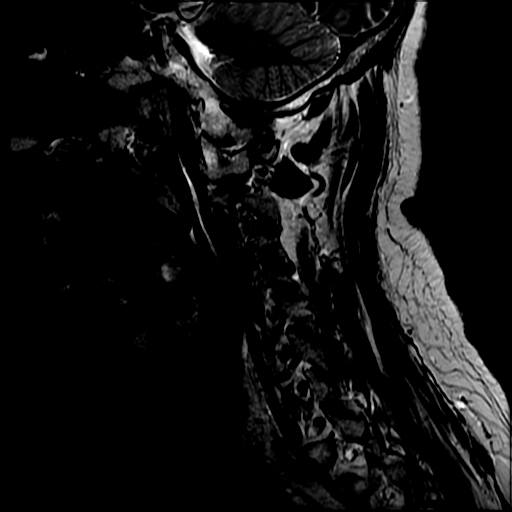
[im 4/16]
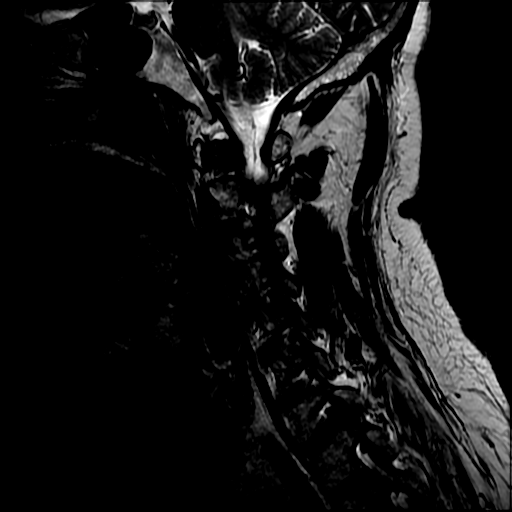
[im 7/16]
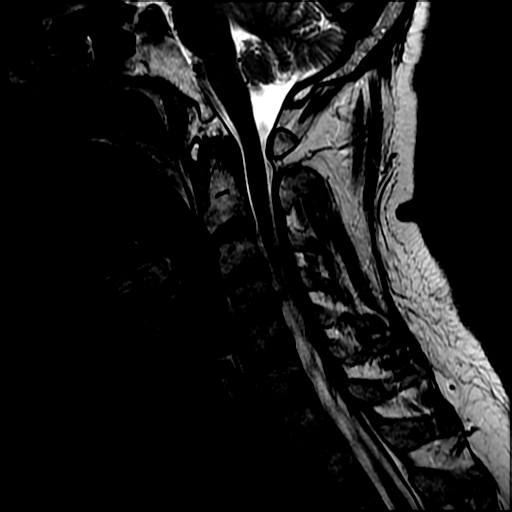
[im 10/16]
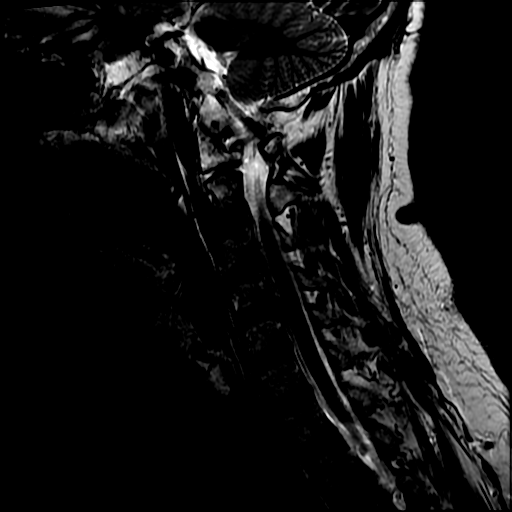
[im 13/16]
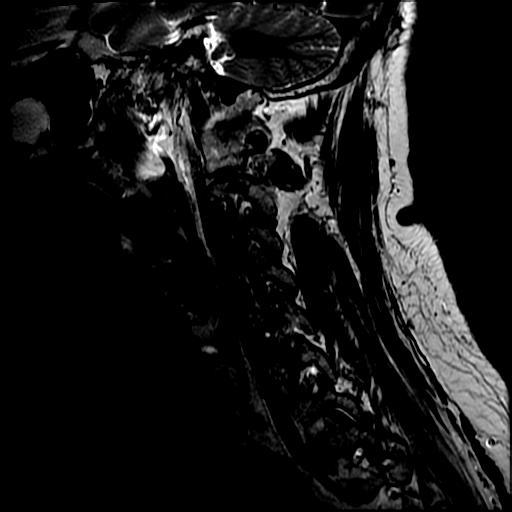
[im 16/16]
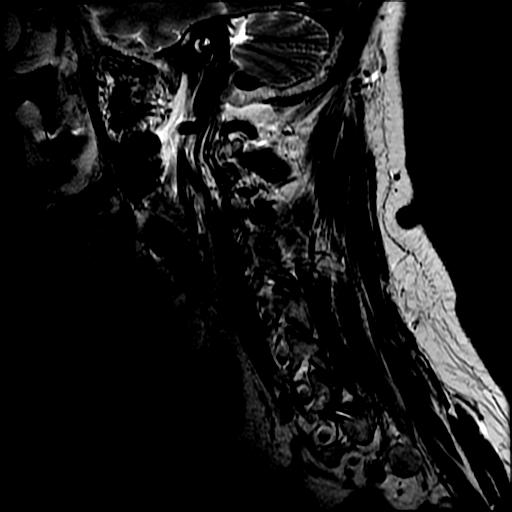

[Series 3: FLAIR · sagittal · 3.0mm · 0.43mm/px · 3 of 16 slices shown]
[im 1/16]
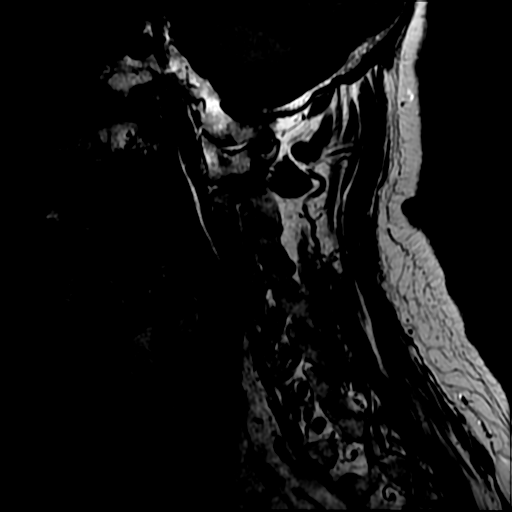
[im 8/16]
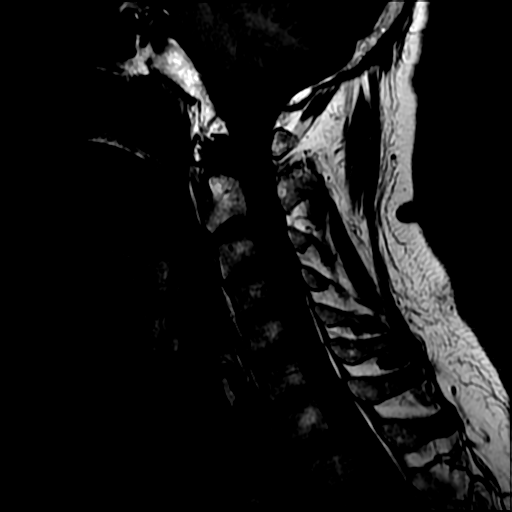
[im 16/16]
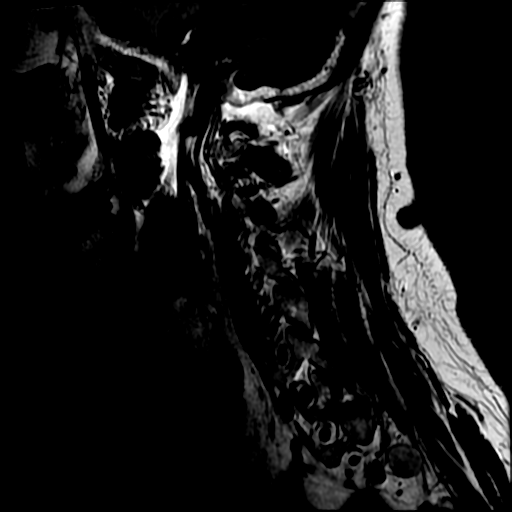

[Series 4: STIR · sagittal · 3.0mm · 0.43mm/px · 3 of 16 slices shown]
[im 1/16]
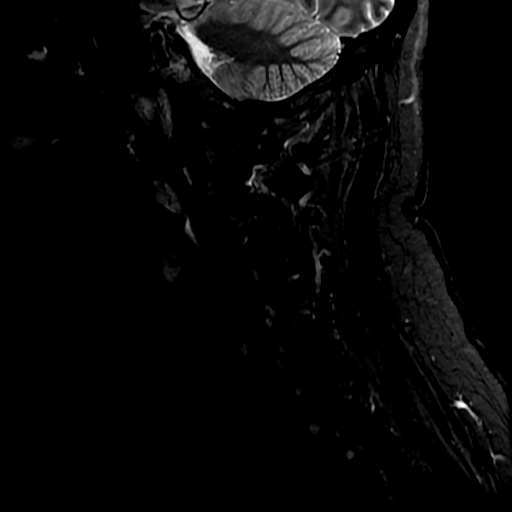
[im 8/16]
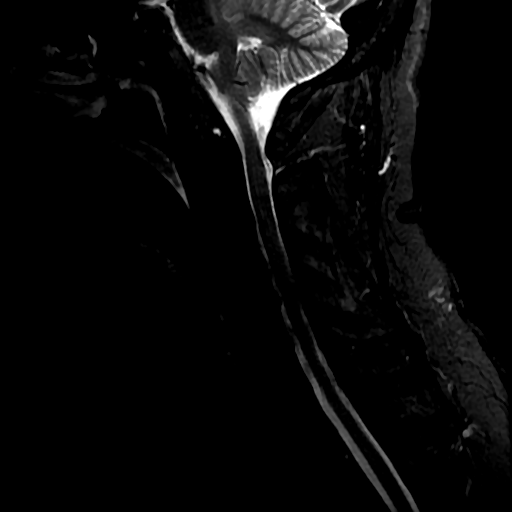
[im 16/16]
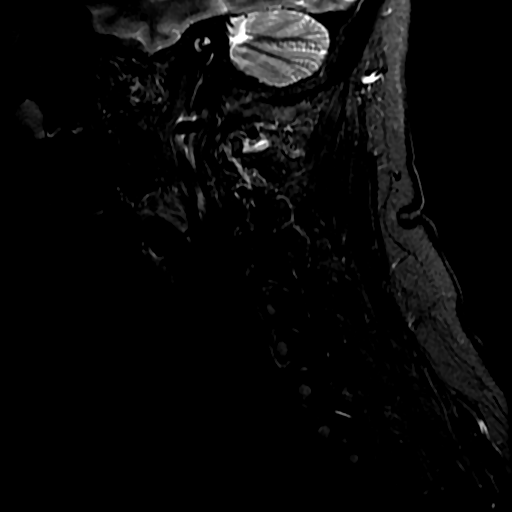

[Series 6: T2 · axial · 3.0mm · 0.35mm/px · z∈[-118,-6]mm · 7 of 44 slices shown (2 of 2)]
[im 1/44]
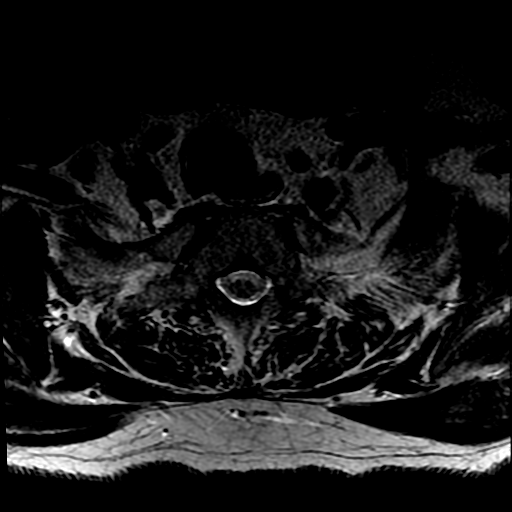
[im 7/44]
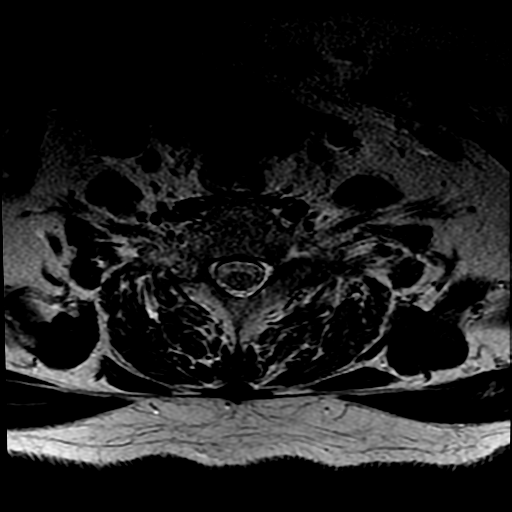
[im 13/44]
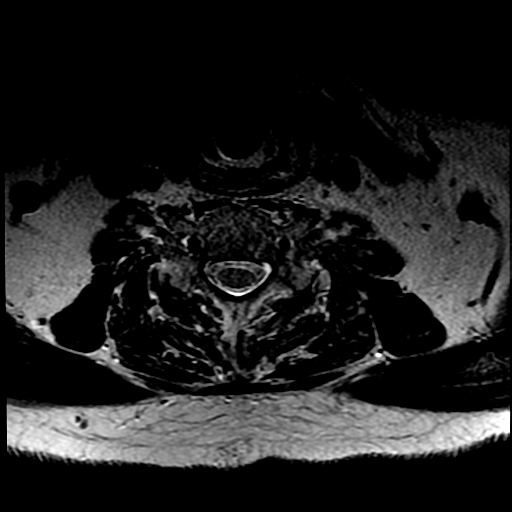
[im 19/44]
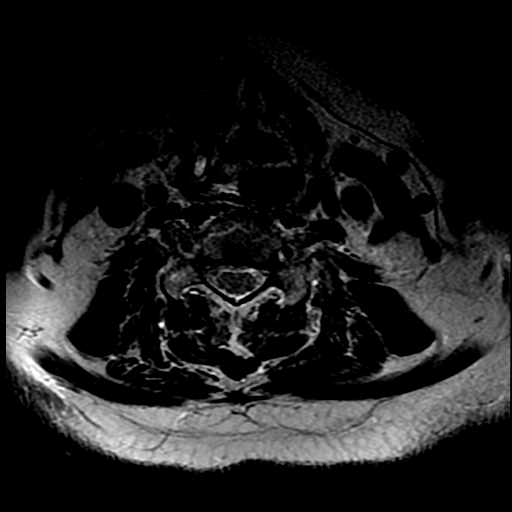
[im 22/44]
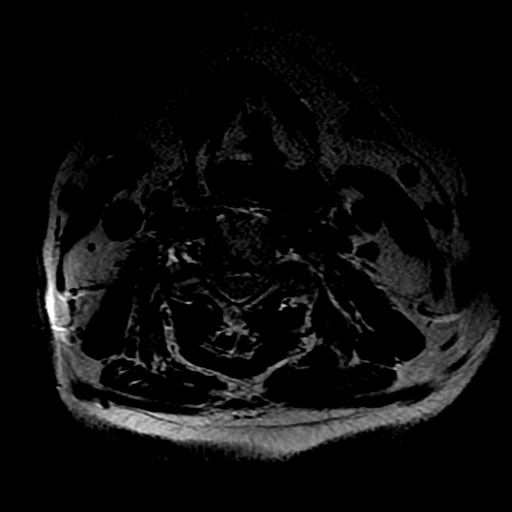
[im 25/44]
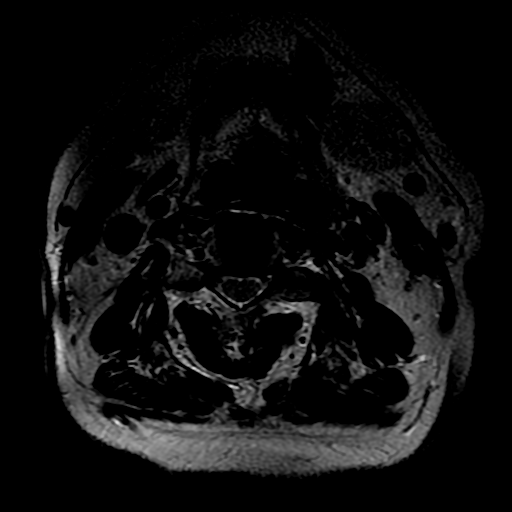
[im 37/44]
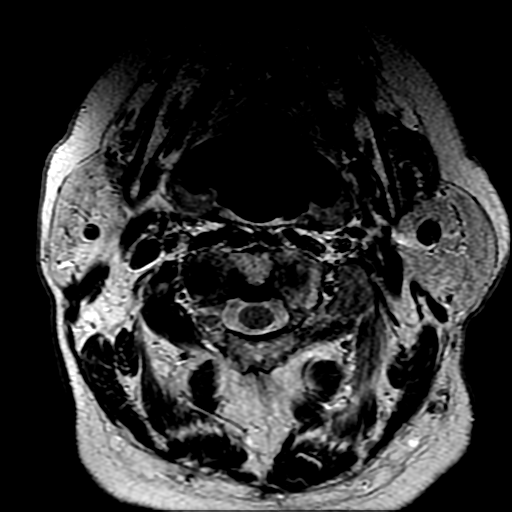

[19 of 48 positions shown; findings below may reference images not displayed]

FINDINGS: Alignment: Normal

Vertebrae: Normal marrow signal.  No bone lesions or fracture.

Cord: Normal cord signal intensity.  No cord lesions or syrinx.

Posterior Fossa, vertebral arteries, paraspinal tissues: No
significant findings. Mild degenerative changes at C1-2 with mild
pannus formation but no significant mass effect on the upper
cervical cord.

Disc levels:

C2-3:  No significant findings.

C3-4: Mild bulging annulus but no focal disc protrusion. Mild
uncinate spurring changes with mild right greater than left
foraminal stenosis.

C4-5: Mild bulging annulus but no focal disc protrusion. Mild
uncinate spurring changes with mild bilateral foraminal stenosis.

C5-6:  No significant findings.

C6-7:  No significant findings.

C7-T1:  No significant findings.
IMPRESSION: 1. Bulging discs at C3-4 and C4-5 but no focal disc protrusion or
spinal stenosis.
2. Bulging discs and uncinate spurring changes contributing to mild
bilateral foraminal stenosis at C3-4 and C4-5.

## 2019-05-29 ENCOUNTER — Other Ambulatory Visit: Payer: Self-pay | Admitting: Family Medicine

## 2019-05-29 DIAGNOSIS — M25562 Pain in left knee: Secondary | ICD-10-CM

## 2019-05-29 DIAGNOSIS — G8929 Other chronic pain: Secondary | ICD-10-CM

## 2019-05-29 NOTE — Telephone Encounter (Signed)
Last OV 04/23/19 Last fill 04/04/19  #45/1

## 2019-06-28 ENCOUNTER — Other Ambulatory Visit: Payer: Self-pay | Admitting: Family Medicine

## 2019-06-28 ENCOUNTER — Telehealth: Payer: Self-pay | Admitting: Family Medicine

## 2019-06-28 DIAGNOSIS — M25562 Pain in left knee: Secondary | ICD-10-CM

## 2019-06-28 NOTE — Telephone Encounter (Signed)
Patient is calling and requesting a refill for pioglitazone sent to [facility] on [address]. CB is [phone]

## 2019-06-29 NOTE — Telephone Encounter (Signed)
Rx sent in

## 2019-07-05 ENCOUNTER — Other Ambulatory Visit: Payer: Self-pay | Admitting: Family Medicine

## 2019-07-05 DIAGNOSIS — J4520 Mild intermittent asthma, uncomplicated: Secondary | ICD-10-CM

## 2019-07-12 IMAGING — DX DG KNEE 1-2V PORT*L*
2 series · 2 of 2 positions shown · non-contrast
Comparison: None.

CLINICAL DATA: Status unicompartmental knee replacement

EXAM:
PORTABLE LEFT KNEE - 1-2 VIEW

[knee ap]
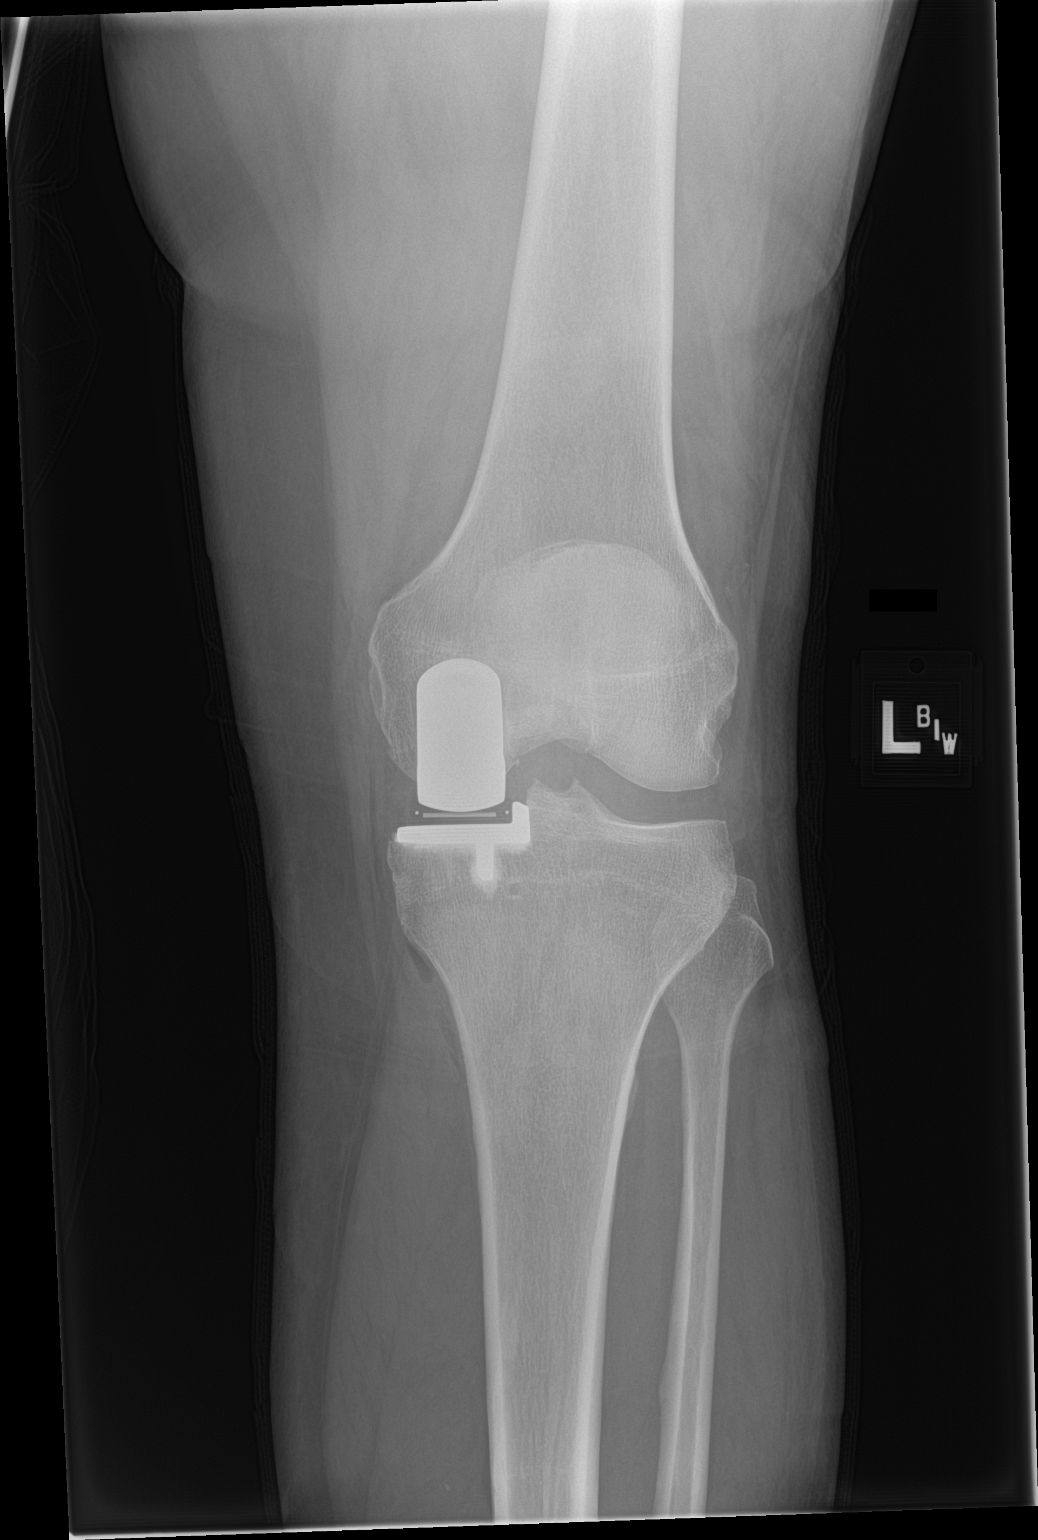

[knee lat]
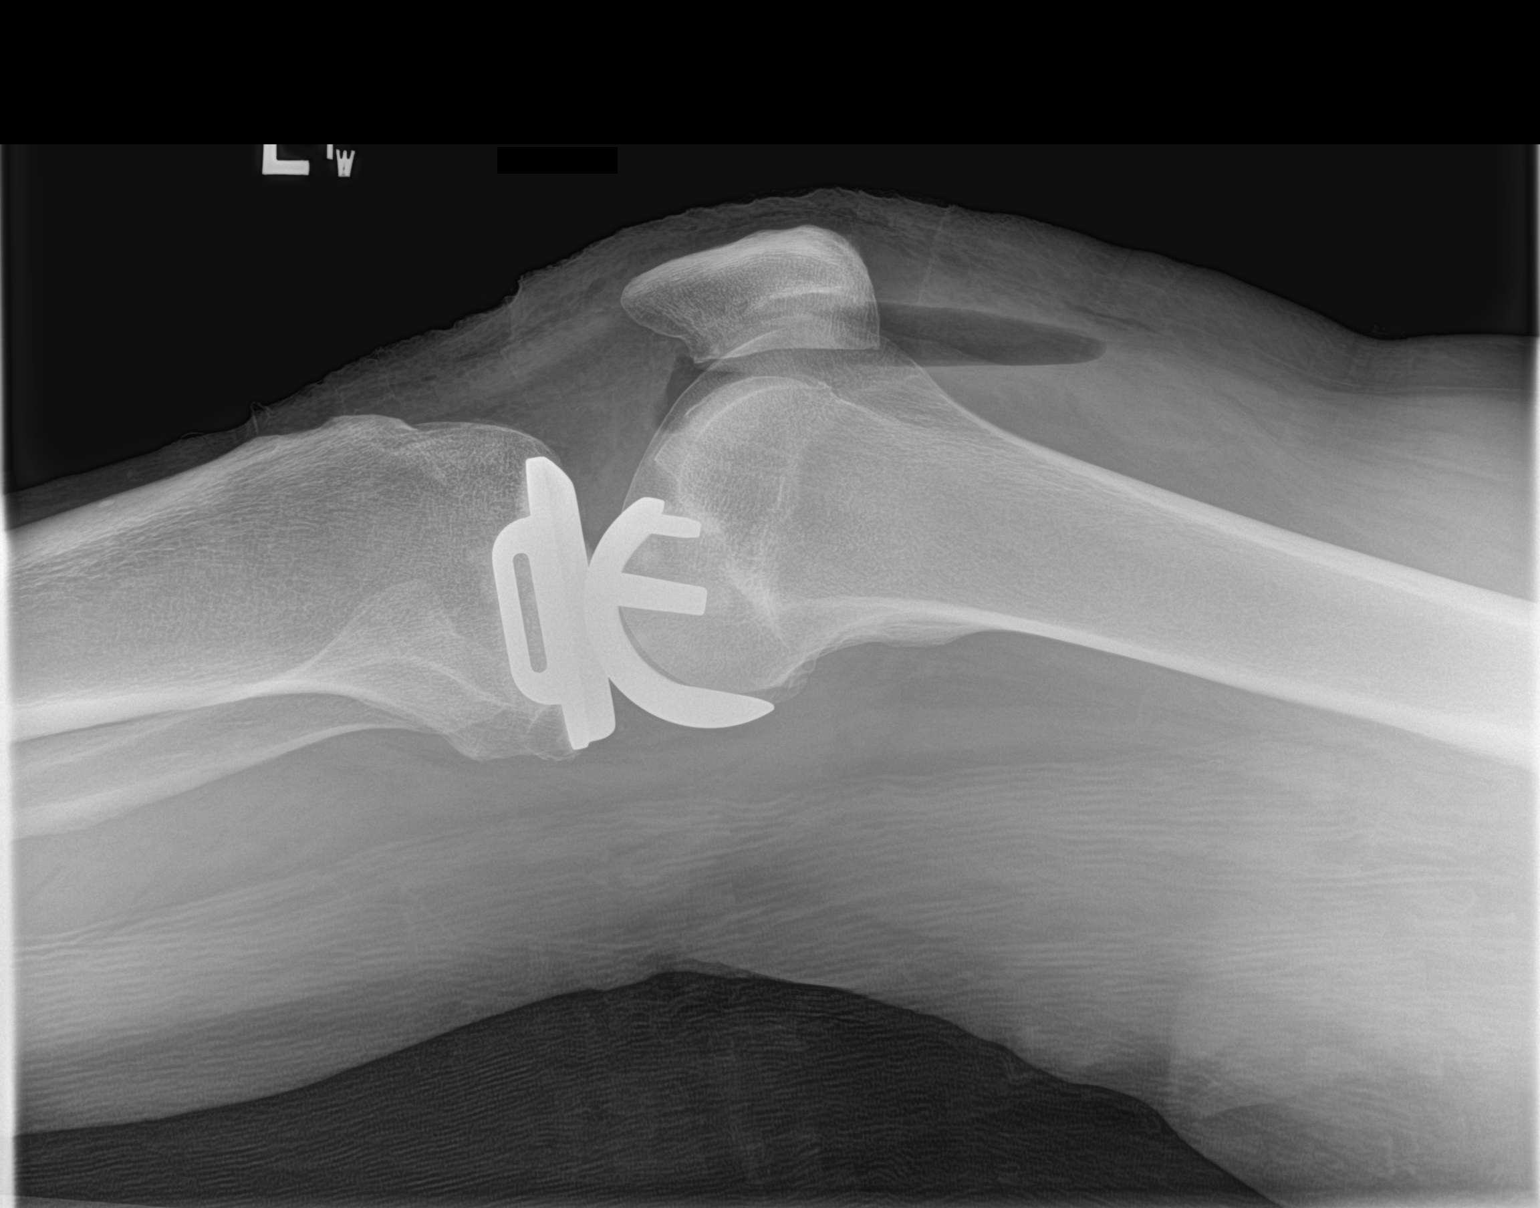

[2 of 2 positions shown; findings below may reference images not displayed]

FINDINGS: Frontal and lateral views obtained. There is a medially
unicompartmental knee replacement with prosthetic components
well-seated. No fracture or dislocation. Other joint spaces appear
unremarkable. There is soft tissue air in the joint, an expected
postoperative finding.
IMPRESSION: Medial unicompartmental knee replacement with prosthetic components
well-seated. Other joint spaces appear unremarkable. No fracture or
dislocation. Soft tissue air in the joint is an expected
postoperative finding.

## 2019-07-16 ENCOUNTER — Other Ambulatory Visit: Payer: Self-pay | Admitting: Family Medicine

## 2019-07-23 ENCOUNTER — Other Ambulatory Visit: Payer: Self-pay

## 2019-07-24 ENCOUNTER — Ambulatory Visit (INDEPENDENT_AMBULATORY_CARE_PROVIDER_SITE_OTHER): Payer: PPO | Admitting: Family Medicine

## 2019-07-24 ENCOUNTER — Encounter: Payer: Self-pay | Admitting: Family Medicine

## 2019-07-24 VITALS — BP 126/68 | HR 73 | Temp 97.6°F | Ht 71.0 in | Wt 250.0 lb

## 2019-07-24 DIAGNOSIS — G8929 Other chronic pain: Secondary | ICD-10-CM

## 2019-07-24 DIAGNOSIS — D539 Nutritional anemia, unspecified: Secondary | ICD-10-CM | POA: Diagnosis not present

## 2019-07-24 DIAGNOSIS — E119 Type 2 diabetes mellitus without complications: Secondary | ICD-10-CM | POA: Diagnosis not present

## 2019-07-24 DIAGNOSIS — M25562 Pain in left knee: Secondary | ICD-10-CM | POA: Diagnosis not present

## 2019-07-24 DIAGNOSIS — I10 Essential (primary) hypertension: Secondary | ICD-10-CM

## 2019-07-24 MED ORDER — DILTIAZEM HCL ER COATED BEADS 180 MG PO TB24: 180.0000 mg | ORAL_TABLET | Freq: Every day | ORAL | 1 refills | Status: DC

## 2019-07-24 MED ORDER — TRAMADOL HCL 50 MG PO TABS: 50.0000 mg | ORAL_TABLET | Freq: Every evening | ORAL | 2 refills | Status: DC | PRN

## 2019-07-24 NOTE — Progress Notes (Signed)
Established Patient Office Visit  Subjective:  Patient ID: Edward Hawkins, male    DOB: November 04, 1950  Age: 69 y.o. MRN: [id number]  CC:  Chief Complaint  Patient presents with   Follow-up    Patient is here today for a F/U with HTN. At 3.15.21 visit Cardizem 60 bid was changed to Cardizem LA 180mg  1qd. He was asked to monitor and record BP and continue all other meds and F/U in 3 months.    HPI Edward Hawkins presents for follow-up of his hypertension, chronic knee pain and hemoglobin of 12.9.  Blood pressure has responded well to the Cardizem LA 180.  Tolerating the medicine well.  Continues to take the Ultram regularly at night for his knee pain with Tylenol.  Hemoglobin A1c confirmed well-controlled diabetes.  Hemoglobin 12.9 with an elevated MCV.  He is taking a multivitamin.  Last colonoscopy was 2017.  Past Medical History:  Diagnosis Date   Arthritis    ASTHMA 10/08/2006   Asthma    last flareup more than 10 yrs ago.   Cataract    extractions   Colon polyps    tubular adenomas   CVA 10/10/2005   DIABETES MELLITUS, TYPE II 10/08/2006   Diverticulosis    HYPERLIPIDEMIA 10/08/2006   HYPERTENSION 10/08/2006   Primary localized osteoarthritis of left knee 12/28/2016   RECTAL BLEEDING 09/15/2009   S/P LASIK surgery of both eyes    Seasonal allergies     Past Surgical History:  Procedure Laterality Date   CATARACT EXTRACTION, BILATERAL     COLONOSCOPY     EYE SURGERY     FRACTURE SURGERY     KNEE SURGERY     left knee x2   PARTIAL KNEE ARTHROPLASTY Left 12/28/2016   Procedure: LEFT UNICOMPARTMENTAL KNEE;  Surgeon: Marchia Bond, MD;  Location: [place];  Service: Orthopedics;  Laterality: Left;   RADIOLOGY WITH ANESTHESIA N/A 10/28/2016   Procedure: MRI - CERVICAL SPINE WITHOUT;  Surgeon: Radiologist, Medication, MD;  Location: [place];  Service: Radiology;  Laterality: N/A;   Stress Cardiolite  11/05/2005   TRANSTHORACIC ECHOCARDIOGRAM  10/12/2005      Family History  Problem Relation Age of Onset   Cancer Brother        Lung Cancer   Diabetes Mother    Diabetes Father    Cancer Daughter        Breast Cancer   Colon cancer Neg Hx     Social History   Socioeconomic History   Marital status: Single    Spouse name: Not on file   Number of children: Not on file   Years of education: Not on file   Highest education level: Not on file  Occupational History   Occupation: Works at [employer] Use   Smoking status: Never Smoker   Smokeless tobacco: Never Used  Scientific laboratory technician Use: Never used  Substance and Sexual Activity   Alcohol use: No   Drug use: No   Sexual activity: Not on file  Other Topics Concern   Not on file  Social History Narrative   Not on file   Social Determinants of Health   Financial Resource Strain:    Difficulty of Paying Living Expenses:   Food Insecurity:    Worried About Charity fundraiser in the Last Year:    Arboriculturist in the Last Year:   Transportation Needs:    Film/video editor (Medical):    Lack  of Transportation (Non-Medical):   Physical Activity:    Days of Exercise per Week:    Minutes of Exercise per Session:   Stress:    Feeling of Stress :   Social Connections:    Frequency of Communication with Friends and Family:    Frequency of Social Gatherings with Friends and Family:    Attends Religious Services:    Active Member of Clubs or Organizations:    Attends Music therapist:    Marital Status:   Intimate Partner Violence:    Fear of Current or Ex-Partner:    Emotionally Abused:    Physically Abused:    Sexually Abused:     Outpatient Medications Prior to Visit  Medication Sig Dispense Refill   aspirin 325 MG tablet Take 325 mg by mouth daily.     atorvastatin (LIPITOR) 20 MG tablet Take 1 tablet by mouth once daily 90 tablet 0   Cholecalciferol (EQL VITAMIN D3) 2000 units CAPS Take 2,000 Units  daily by mouth.     fish oil-omega-3 fatty acids 1000 MG capsule Take 1 g by mouth daily.     Fluticasone-Salmeterol (ADVAIR) 250-50 MCG/DOSE AEPB INHALE 1 DOSE BY MOUTH ONCE DAILY 60 each 4   losartan (COZAAR) 100 MG tablet Take 1 tablet (100 mg total) by mouth daily. 90 tablet 3   montelukast (SINGULAIR) 10 MG tablet TAKE 1 TABLET BY MOUTH AT BEDTIME 90 tablet 0   Multiple Vitamin (MULTIVITAMIN) tablet Take 1 tablet by mouth daily.      pioglitazone (ACTOS) 15 MG tablet Take 1 tablet by mouth once daily 90 tablet 0   diltiazem (CARDIZEM LA) 180 MG 24 hr tablet Take 1 tablet (180 mg total) by mouth daily. 90 tablet 1   metFORMIN (GLUCOPHAGE XR) 500 MG 24 hr tablet Take 2 tablets (1,000 mg total) by mouth daily with breakfast. 180 tablet 1   traMADol (ULTRAM) 50 MG tablet TAKE 1 TO 2 TABLETS BY MOUTH AT BEDTIME AS NEEDED 45 tablet 0   No facility-administered medications prior to visit.    No Known Allergies  ROS Review of Systems  Constitutional: Negative.   HENT: Negative.   Respiratory: Negative.   Cardiovascular: Negative.   Gastrointestinal: Negative for anal bleeding and blood in stool.  Endocrine: Negative for polyphagia and polyuria.  Genitourinary: Negative for hematuria.  Musculoskeletal: Positive for arthralgias and gait problem.  Allergic/Immunologic: Negative for immunocompromised state.  Neurological: Negative for light-headedness and headaches.  Hematological: Does not bruise/bleed easily.  Psychiatric/Behavioral: Negative for decreased concentration and dysphoric mood.      Objective:    Physical Exam Vitals and nursing note reviewed.  Constitutional:      General: He is not in acute distress.    Appearance: Normal appearance. He is not ill-appearing, toxic-appearing or diaphoretic.  HENT:     Head: Normocephalic and atraumatic.     Right Ear: External ear normal.     Left Ear: External ear normal.  Eyes:     General: No scleral icterus.        Right eye: No discharge.        Left eye: No discharge.     Conjunctiva/sclera: Conjunctivae normal.  Cardiovascular:     Rate and Rhythm: Normal rate and regular rhythm.  Pulmonary:     Effort: Pulmonary effort is normal.     Breath sounds: Normal breath sounds.  Skin:    General: Skin is warm and dry.  Neurological:  Mental Status: He is alert and oriented to person, place, and time.  Psychiatric:        Mood and Affect: Mood normal.        Behavior: Behavior normal.     BP 126/68    Pulse 73    Temp 97.6 F (36.4 C) (Tympanic)    Ht 5\' 11"  (1.803 m)    Wt 250 lb (113.4 kg)    SpO2 96%    BMI 34.87 kg/m  Wt Readings from Last 3 Encounters:  07/24/19 250 lb (113.4 kg)  04/23/19 257 lb 12.8 oz (116.9 kg)  04/04/19 254 lb (115.2 kg)     There are no preventive care reminders to display for this patient.  There are no preventive care reminders to display for this patient.  Lab Results  Component Value Date   TSH 3.19 04/24/2019   Lab Results  Component Value Date   WBC 6.6 04/24/2019   HGB 12.9 (L) 04/24/2019   HCT 39.6 04/24/2019   MCV 99.7 04/24/2019   PLT 323.0 04/24/2019   Lab Results  Component Value Date   NA 141 04/24/2019   K 3.9 04/24/2019   CO2 28 04/24/2019   GLUCOSE 129 (H) 04/24/2019   BUN 15 04/24/2019   CREATININE 1.01 04/24/2019   BILITOT 0.4 11/06/2018   ALKPHOS 100 11/06/2018   AST 17 11/06/2018   ALT 15 11/06/2018   PROT 7.2 11/06/2018   ALBUMIN 4.3 11/06/2018   CALCIUM 9.0 04/24/2019   ANIONGAP 6 12/29/2016   GFR 73.20 04/24/2019   Lab Results  Component Value Date   CHOL 136 11/06/2018   Lab Results  Component Value Date   HDL 37.20 (L) 11/06/2018   Lab Results  Component Value Date   LDLCALC 74 11/06/2018   Lab Results  Component Value Date   TRIG 125.0 11/06/2018   Lab Results  Component Value Date   CHOLHDL 4 11/06/2018   Lab Results  Component Value Date   HGBA1C 6.3 04/24/2019      Assessment & Plan:    Problem List Items Addressed This Visit      Cardiovascular and Mediastinum   Essential hypertension - Primary   Relevant Medications   diltiazem (CARDIZEM LA) 180 MG 24 hr tablet     Endocrine   Type 2 diabetes mellitus without complication, without long-term current use of insulin ([facility])     Other   Chronic pain of left knee   Relevant Medications   traMADol (ULTRAM) 50 MG tablet   Macrocytic anemia      Meds ordered this encounter  Medications   diltiazem (CARDIZEM LA) 180 MG 24 hr tablet    Sig: Take 1 tablet (180 mg total) by mouth daily.    Dispense:  90 tablet    Refill:  1   traMADol (ULTRAM) 50 MG tablet    Sig: Take 1-2 tablets (50-100 mg total) by mouth at bedtime as needed.    Dispense:  45 tablet    Refill:  2    Follow-up: Return in about 6 months (around 01/23/2020).   Continue all current medicines as above Libby Maw, MD

## 2019-08-14 ENCOUNTER — Other Ambulatory Visit: Payer: Self-pay | Admitting: Family Medicine

## 2019-08-23 DIAGNOSIS — E119 Type 2 diabetes mellitus without complications: Secondary | ICD-10-CM | POA: Diagnosis not present

## 2019-08-23 DIAGNOSIS — Z7984 Long term (current) use of oral hypoglycemic drugs: Secondary | ICD-10-CM | POA: Diagnosis not present

## 2019-08-23 DIAGNOSIS — H524 Presbyopia: Secondary | ICD-10-CM | POA: Diagnosis not present

## 2019-08-23 DIAGNOSIS — Z961 Presence of intraocular lens: Secondary | ICD-10-CM | POA: Diagnosis not present

## 2019-09-12 NOTE — Progress Notes (Signed)
Subjective:   Edward Hawkins is a 69 y.o. male who presents for Medicare Annual/Subsequent preventive examination.  I connected with Quamir today by telephone and verified that I am speaking with the correct person using two identifiers. Location patient: home Location provider: work Persons participating in the virtual visit: patient, Marine scientist.    I discussed the limitations, risks, security and privacy concerns of performing an evaluation and management service by telephone and the availability of in person appointments. I also discussed with the patient that there may be a patient responsible charge related to this service. The patient expressed understanding and verbally consented to this telephonic visit.    Interactive audio and video telecommunications were attempted between this provider and patient, however failed, due to patient having technical difficulties OR patient did not have access to video capability.  We continued and completed visit with audio only.  Some vital signs may be absent or patient reported.   Time Spent with patient on telephone encounter: 20 minutes  Review of Systems:   Cardiac Risk Factors include: advanced age (>64men, >75 women);dyslipidemia;diabetes mellitus;obesity (BMI >30kg/m2);male gender     Objective:    Vitals: Ht 5\' 11"  (1.803 m)   Wt 250 lb (113.4 kg)   BMI 34.87 kg/m   Body mass index is 34.87 kg/m.  Advanced Directives 09/13/2019 11/01/2017 12/28/2016 12/20/2016 10/28/2016 10/03/2015 10/03/2014  Does Patient Have a Medical Advance Directive? Yes No Yes Yes No No No  Type of Advance Directive [place] will Living will - - -  Does patient want to make changes to medical advance directive? - - No - Patient declined - - - -  Copy of [place] in Chart? No - copy requested - - - - - -  Would patient like information on creating a medical advance directive? - - - - No - Patient declined - -      Tobacco Social History   Tobacco Use  Smoking Status Never Smoker  Smokeless Tobacco Never Used     Counseling given: Not Answered   Clinical Intake:  Pre-visit preparation completed: Yes  Pain : No/denies pain     Nutritional Status: BMI > 30  Obese Nutritional Risks: None Diabetes: Yes CBG done?: No Did pt. bring in CBG monitor from home?: No  How often do you need to have someone help you when you read instructions, pamphlets, or other written materials from your doctor or pharmacy?: 1 - Never What is the last grade level you completed in school?: 11th grade  Nutrition Risk Assessment:  Has the patient had any N/V/D within the last 2 months?  No  Does the patient have any non-healing wounds?  No  Has the patient had any unintentional weight loss or weight gain?  No   Diabetes:  Is the patient diabetic?  Yes  If diabetic, was a CBG obtained today?  No  Did the patient bring in their glucometer from home?  No phone visit How often do you monitor your CBG's? never.   Financial Strains and Diabetes Management:  Are you having any financial strains with the device, your supplies or your medication? No .  Does the patient want to be seen by Chronic Care Management for management of their diabetes?  No  Would the patient like to be referred to a Nutritionist or for Diabetic Management?  No   Diabetic Exams:  Diabetic Eye Exam: Completed 08/2019.  Diabetic Foot Exam: Completed 04/23/2019.  Interpreter Needed?: No  Information entered by :: Caroleen Hamman LPN  Past Medical History:  Diagnosis Date  . Arthritis   . ASTHMA 10/08/2006  . Asthma    last flareup more than 10 yrs ago.  . Cataract    extractions  . Colon polyps    tubular adenomas  . CVA 10/10/2005  . DIABETES MELLITUS, TYPE II 10/08/2006  . Diverticulosis   . HYPERLIPIDEMIA 10/08/2006  . HYPERTENSION 10/08/2006  . Primary localized osteoarthritis of left knee 12/28/2016  . RECTAL BLEEDING  09/15/2009  . S/P LASIK surgery of both eyes   . Seasonal allergies    Past Surgical History:  Procedure Laterality Date  . CATARACT EXTRACTION, BILATERAL    . COLONOSCOPY    . EYE SURGERY    . FRACTURE SURGERY    . KNEE SURGERY     left knee x2  . PARTIAL KNEE ARTHROPLASTY Left 12/28/2016   Procedure: LEFT UNICOMPARTMENTAL KNEE;  Surgeon: Marchia Bond, MD;  Location: [place] [facility];  Service: Orthopedics;  Laterality: Left;  . RADIOLOGY WITH ANESTHESIA N/A 10/28/2016   Procedure: MRI - CERVICAL SPINE WITHOUT;  Surgeon: Radiologist, Medication, MD;  Location: [facility];  Service: Radiology;  Laterality: N/A;  . Stress Cardiolite  11/05/2005  . TRANSTHORACIC ECHOCARDIOGRAM  10/12/2005   Family History  Problem Relation Age of Onset  . Cancer Brother        Lung Cancer  . Diabetes Mother   . Diabetes Father   . Cancer Daughter        Breast Cancer  . Colon cancer Neg Hx    Social History   Socioeconomic History  . Marital status: Single    Spouse name: Not on file  . Number of children: Not on file  . Years of education: Not on file  . Highest education level: Not on file  Occupational History  . Occupation: Works at [employer]  . Smoking status: Never Smoker  . Smokeless tobacco: Never Used  Vaping Use  . Vaping Use: Never used  Substance and Sexual Activity  . Alcohol use: No  . Drug use: No  . Sexual activity: Not on file  Other Topics Concern  . Not on file  Social History Narrative  . Not on file   Social Determinants of Health   Financial Resource Strain: Low Risk   . Difficulty of Paying Living Expenses: Not hard at all  Food Insecurity: No Food Insecurity  . Worried About Charity fundraiser in the Last Year: Never true  . Ran Out of Food in the Last Year: Never true  Transportation Needs: No Transportation Needs  . Lack of Transportation (Medical): No  . Lack of Transportation (Non-Medical): No  Physical Activity: Insufficiently Active  . Days of  Exercise per Week: 3 days  . Minutes of Exercise per Session: 40 min  Stress: No Stress Concern Present  . Feeling of Stress : Not at all  Social Connections: Socially Isolated  . Frequency of Communication with Friends and Family: More than three times a week  . Frequency of Social Gatherings with Friends and Family: More than three times a week  . Attends Religious Services: Never  . Active Member of Clubs or Organizations: No  . Attends Archivist Meetings: Never  . Marital Status: Never married    Outpatient Encounter Medications as of 09/13/2019  Medication Sig  . aspirin 325 MG tablet Take 325 mg by mouth daily.  Marland Kitchen atorvastatin (  LIPITOR) 20 MG tablet Take 1 tablet by mouth once daily  . Cholecalciferol (EQL VITAMIN D3) 2000 units CAPS Take 2,000 Units daily by mouth.  . diltiazem (CARDIZEM LA) 180 MG 24 hr tablet Take 1 tablet (180 mg total) by mouth daily.  . fish oil-omega-3 fatty acids 1000 MG capsule Take 1 g by mouth daily.  . Fluticasone-Salmeterol (ADVAIR) 250-50 MCG/DOSE AEPB INHALE 1 DOSE BY MOUTH ONCE DAILY  . losartan (COZAAR) 100 MG tablet Take 1 tablet by mouth once daily  . montelukast (SINGULAIR) 10 MG tablet TAKE 1 TABLET BY MOUTH AT BEDTIME  . Multiple Vitamin (MULTIVITAMIN) tablet Take 1 tablet by mouth daily.   . pioglitazone (ACTOS) 15 MG tablet Take 1 tablet by mouth once daily  . traMADol (ULTRAM) 50 MG tablet Take 1-2 tablets (50-100 mg total) by mouth at bedtime as needed.   No facility-administered encounter medications on file as of 09/13/2019.    Activities of Daily Living In your present state of health, do you have any difficulty performing the following activities: 09/13/2019  Hearing? N  Vision? N  Difficulty concentrating or making decisions? N  Walking or climbing stairs? N  Dressing or bathing? N  Doing errands, shopping? N  Preparing Food and eating ? N  Using the Toilet? N  In the past six months, have you accidently leaked urine?  N  Do you have problems with loss of bowel control? N  Managing your Medications? N  Managing your Finances? N  Housekeeping or managing your Housekeeping? N  Some recent data might be hidden    Patient Care Team: Libby Maw, MD as PCP - General (Family Medicine) Avie Echevaria., MD as Referring Physician (Sports Medicine) Rutherford Guys, MD as Attending Physician (Ophthalmology)   Assessment:   This is a routine wellness examination for [place].  Exercise Activities and Dietary recommendations Current Exercise Habits: The patient has a physically strenuous job, but has no regular exercise apart from work.  Goals Addressed            This Visit's Progress   . Patient Stated       Drink more water & continue walking       Fall Risk Fall Risk  09/13/2019 04/23/2019 11/06/2018 11/01/2017  Falls in the past year? 0 0 0 No  Number falls in past yr: 0 - - -  Injury with Fall? 0 - - -  Follow up Falls prevention discussed - - -   FALL RISK PREVENTION PERTAINING TO THE HOME:  Any stairs in or around the home? Yes  If so, are there any without handrails? No   Home free of loose throw rugs in walkways, pet beds, electrical cords, etc? Yes  Adequate lighting in your home to reduce risk of falls? Yes   ASSISTIVE DEVICES UTILIZED TO PREVENT FALLS:  Life alert? No  Use of a cane, walker or w/c? No  Grab bars in the bathroom? No  Shower chair or bench in shower? No  Elevated toilet seat or a handicapped toilet? No    TIMED UP AND GO:  Was the test performed? No . Phone visit    Depression Screen PHQ 2/9 Scores 09/13/2019 11/06/2018 11/01/2017  PHQ - 2 Score 0 0 0    Cognitive Function: No cognitive impairment noted. Patient plays games on his phone for brain health.        Immunization History  Administered Date(s) Administered  . Fluad Quad(high Dose 65+) 10/09/2018  .  Influenza, High Dose Seasonal PF 11/01/2016, 11/01/2017  . PFIZER SARS-COV-2  Vaccination 02/27/2019, 03/18/2019  . Pneumococcal Conjugate-13 11/06/2018  . Pneumococcal Polysaccharide-23 09/28/2010, 10/03/2015  . Td 11/09/2000  . Tdap 10/01/2013    Qualifies for Shingles Vaccine? Yes  Zostavax completed no. Due for Shingrix. Education has been provided regarding the importance of this vaccine. Pt has been advised to call insurance company to determine out of pocket expense. Advised may also receive vaccine at local pharmacy or Health Dept. Verbalized acceptance and understanding.  Tdap: Up to Date  Flu Vaccine: Due 10/2019  Pneumococcal Vaccine:  Up to Date  Covid-19 Vaccine: Completed vaccines  Screening Tests Health Maintenance  Topic Date Due  . INFLUENZA VACCINE  09/09/2019  . OPHTHALMOLOGY EXAM  09/19/2019  . HEMOGLOBIN A1C  10/25/2019  . FOOT EXAM  04/22/2020  . COLONOSCOPY  12/10/2020  . TETANUS/TDAP  10/02/2023  . COVID-19 Vaccine  Completed  . Hepatitis C Screening  Completed  . PNA vac Low Risk Adult  Completed   Cancer Screenings:  Colorectal Screening: Completed 12/11/2015. Repeat every 5 years.  Lung Cancer Screening: (Low Dose CT Chest recommended if Age 47-80 years, 30 pack-year currently smoking OR have quit w/in 15years.) does not qualify.    Additional Screening:  Hepatitis C Screening: Completed 10/03/2015  Dental Screening: Recommended annual dental exams for proper oral hygiene  Community Resource Referral:  CRR required this visit?  No        Plan:  I have personally reviewed and addressed the Medicare Annual Wellness questionnaire and have noted the following in the patient's chart:  A. Medical and social history B. Use of alcohol, tobacco or illicit drugs  C. Current medications and supplements D. Functional ability and status E.  Nutritional status F.  Physical activity G. Advance directives H. List of other physicians I.  Hospitalizations, surgeries, and ER visits in previous 12 months J.  [place]  such as hearing and vision if needed, cognitive and depression L. Referrals and appointments   In addition, I have reviewed and discussed with patient certain preventive protocols, quality metrics, and best practice recommendations. A written personalized care plan for preventive services as well as general preventive health recommendations were provided to patient.  Due to this being a telephonic visit, the after visit summary with patients personalized plan was offered to patient via mail or my-chart.  Per request, patient was mailed a copy of AVS.  Signed,   Marta Antu, LPN  09/11/6960 Nurse Health Advisor  Nurse Notes:

## 2019-09-13 ENCOUNTER — Ambulatory Visit (INDEPENDENT_AMBULATORY_CARE_PROVIDER_SITE_OTHER): Payer: PPO

## 2019-09-13 VITALS — Ht 71.0 in | Wt 250.0 lb

## 2019-09-13 DIAGNOSIS — Z0000 Encounter for general adult medical examination without abnormal findings: Secondary | ICD-10-CM | POA: Diagnosis not present

## 2019-09-13 NOTE — Patient Instructions (Signed)
Edward Hawkins , Thank you for taking time to complete your Medicare Wellness Visit. I appreciate your ongoing commitment to your health goals. Please review the following plan we discussed and let me know if I can assist you in the future.   Screening recommendations/referrals: Colonoscopy: Completed 12/11/2015-Due 12/10/2020 Recommended yearly ophthalmology/optometry visit for glaucoma screening and checkup Recommended yearly dental visit for hygiene and checkup  Vaccinations: Influenza vaccine: Up to Date-Due 10/2019 Pneumococcal vaccine: Completed vaccines Tdap vaccine: Up to Date- Due-09/22/2023 Shingles vaccine: Discuss with pharmacy Covid-19: Completed vaccines  Advanced directives: Please bring a copy of your [place] to your next office visit.  Conditions/risks identified: See problem list  Next appointment: Follow up in one year for your annual wellness visit.   Preventive Care 69 Years and Older, Male Preventive care refers to lifestyle choices and visits with your health care provider that can promote health and wellness. What does preventive care include?  A yearly physical exam. This is also called an annual well check.  Dental exams once or twice a year.  Routine eye exams. Ask your health care provider how often you should have your eyes checked.  Personal lifestyle choices, including:  Daily care of your teeth and gums.  Regular physical activity.  Eating a healthy diet.  Avoiding tobacco and drug use.  Limiting alcohol use.  Practicing safe sex.  Taking low doses of aspirin every day.  Taking vitamin and mineral supplements as recommended by your health care provider. What happens during an annual well check? The services and screenings done by your health care provider during your annual well check will depend on your age, overall health, lifestyle risk factors, and family history of disease. Counseling  Your health care provider  may ask you questions about your:  Alcohol use.  Tobacco use.  Drug use.  Emotional well-being.  Home and relationship well-being.  Sexual activity.  Eating habits.  History of falls.  Memory and ability to understand (cognition).  Work and work Statistician. Screening  You may have the following tests or measurements:  Height, weight, and BMI.  Blood pressure.  Lipid and cholesterol levels. These may be checked every 5 years, or more frequently if you are over 21 years old.  Skin check.  Lung cancer screening. You may have this screening every year starting at age 69 if you have a 30-pack-year history of smoking and currently smoke or have quit within the past 15 years.  Fecal occult blood test (FOBT) of the stool. You may have this test every year starting at age 69.  Flexible sigmoidoscopy or colonoscopy. You may have a sigmoidoscopy every 5 years or a colonoscopy every 10 years starting at age 69.  Prostate cancer screening. Recommendations will vary depending on your family history and other risks.  Hepatitis C blood test.  Hepatitis B blood test.  Sexually transmitted disease (STD) testing.  Diabetes screening. This is done by checking your blood sugar (glucose) after you have not eaten for a while (fasting). You may have this done every 1-3 years.  Abdominal aortic aneurysm (AAA) screening. You may need this if you are a current or former smoker.  Osteoporosis. You may be screened starting at age 73 if you are at high risk. Talk with your health care provider about your test results, treatment options, and if necessary, the need for more tests. Vaccines  Your health care provider may recommend certain vaccines, such as:  Influenza vaccine. This is recommended every  year.  Tetanus, diphtheria, and acellular pertussis (Tdap, Td) vaccine. You may need a Td booster every 10 years.  Zoster vaccine. You may need this after age 20.  Pneumococcal 13-valent  conjugate (PCV13) vaccine. One dose is recommended after age 63.  Pneumococcal polysaccharide (PPSV23) vaccine. One dose is recommended after age 62. Talk to your health care provider about which screenings and vaccines you need and how often you need them. This information is not intended to replace advice given to you by your health care provider. Make sure you discuss any questions you have with your health care provider. Document Released: 02/21/2015 Document Revised: 10/15/2015 Document Reviewed: 11/26/2014 Elsevier Interactive Patient Education  2017 [place] Prevention in the Home Falls can cause injuries. They can happen to people of all ages. There are many things you can do to make your home safe and to help prevent falls. What can I do on the outside of my home?  Regularly fix the edges of walkways and driveways and fix any cracks.  Remove anything that might make you trip as you walk through a door, such as a raised step or threshold.  Trim any bushes or trees on the path to your home.  Use bright outdoor lighting.  Clear any walking paths of anything that might make someone trip, such as rocks or tools.  Regularly check to see if handrails are loose or broken. Make sure that both sides of any steps have handrails.  Any raised decks and porches should have guardrails on the edges.  Have any leaves, snow, or ice cleared regularly.  Use sand or salt on walking paths during winter.  Clean up any spills in your garage right away. This includes oil or grease spills. What can I do in the bathroom?  Use night lights.  Install grab bars by the toilet and in the tub and shower. Do not use towel bars as grab bars.  Use non-skid mats or decals in the tub or shower.  If you need to sit down in the shower, use a plastic, non-slip stool.  Keep the floor dry. Clean up any water that spills on the floor as soon as it happens.  Remove soap buildup in the tub or  shower regularly.  Attach bath mats securely with double-sided non-slip rug tape.  Do not have throw rugs and other things on the floor that can make you trip. What can I do in the bedroom?  Use night lights.  Make sure that you have a light by your bed that is easy to reach.  Do not use any sheets or blankets that are too big for your bed. They should not hang down onto the floor.  Have a firm chair that has side arms. You can use this for support while you get dressed.  Do not have throw rugs and other things on the floor that can make you trip. What can I do in the kitchen?  Clean up any spills right away.  Avoid walking on wet floors.  Keep items that you use a lot in easy-to-reach places.  If you need to reach something above you, use a strong step stool that has a grab bar.  Keep electrical cords out of the way.  Do not use floor polish or wax that makes floors slippery. If you must use wax, use non-skid floor wax.  Do not have throw rugs and other things on the floor that can make you trip. What can  I do with my stairs?  Do not leave any items on the stairs.  Make sure that there are handrails on both sides of the stairs and use them. Fix handrails that are broken or loose. Make sure that handrails are as long as the stairways.  Check any carpeting to make sure that it is firmly attached to the stairs. Fix any carpet that is loose or worn.  Avoid having throw rugs at the top or bottom of the stairs. If you do have throw rugs, attach them to the floor with carpet tape.  Make sure that you have a light switch at the top of the stairs and the bottom of the stairs. If you do not have them, ask someone to add them for you. What else can I do to help prevent falls?  Wear shoes that:  Do not have high heels.  Have rubber bottoms.  Are comfortable and fit you well.  Are closed at the toe. Do not wear sandals.  If you use a stepladder:  Make sure that it is fully  opened. Do not climb a closed stepladder.  Make sure that both sides of the stepladder are locked into place.  Ask someone to hold it for you, if possible.  Clearly mark and make sure that you can see:  Any grab bars or handrails.  First and last steps.  Where the edge of each step is.  Use tools that help you move around (mobility aids) if they are needed. These include:  Canes.  Walkers.  Scooters.  Crutches.  Turn on the lights when you go into a dark area. Replace any light bulbs as soon as they burn out.  Set up your furniture so you have a clear path. Avoid moving your furniture around.  If any of your floors are uneven, fix them.  If there are any pets around you, be aware of where they are.  Review your medicines with your doctor. Some medicines can make you feel dizzy. This can increase your chance of falling. Ask your doctor what other things that you can do to help prevent falls. This information is not intended to replace advice given to you by your health care provider. Make sure you discuss any questions you have with your health care provider. Document Released: 11/21/2008 Document Revised: 07/03/2015 Document Reviewed: 03/01/2014 Elsevier Interactive Patient Education  2017 [employer].

## 2019-10-01 ENCOUNTER — Other Ambulatory Visit: Payer: Self-pay | Admitting: Family Medicine

## 2019-10-17 ENCOUNTER — Other Ambulatory Visit: Payer: Self-pay | Admitting: Family Medicine

## 2019-10-17 DIAGNOSIS — G8929 Other chronic pain: Secondary | ICD-10-CM

## 2019-11-01 ENCOUNTER — Other Ambulatory Visit: Payer: Self-pay | Admitting: Family Medicine

## 2019-11-07 ENCOUNTER — Other Ambulatory Visit: Payer: Self-pay

## 2019-11-08 ENCOUNTER — Encounter: Payer: Self-pay | Admitting: Family Medicine

## 2019-11-08 ENCOUNTER — Ambulatory Visit (INDEPENDENT_AMBULATORY_CARE_PROVIDER_SITE_OTHER): Payer: PPO | Admitting: Family Medicine

## 2019-11-08 VITALS — BP 130/62 | HR 81 | Temp 97.1°F | Ht 71.0 in | Wt 240.0 lb

## 2019-11-08 DIAGNOSIS — Z23 Encounter for immunization: Secondary | ICD-10-CM

## 2019-11-08 DIAGNOSIS — D539 Nutritional anemia, unspecified: Secondary | ICD-10-CM

## 2019-11-08 DIAGNOSIS — E119 Type 2 diabetes mellitus without complications: Secondary | ICD-10-CM | POA: Diagnosis not present

## 2019-11-08 DIAGNOSIS — Z0000 Encounter for general adult medical examination without abnormal findings: Secondary | ICD-10-CM | POA: Diagnosis not present

## 2019-11-08 DIAGNOSIS — E785 Hyperlipidemia, unspecified: Secondary | ICD-10-CM

## 2019-11-08 DIAGNOSIS — I10 Essential (primary) hypertension: Secondary | ICD-10-CM | POA: Diagnosis not present

## 2019-11-08 NOTE — Patient Instructions (Signed)
Health Maintenance After Age 69 After age 39, you are at a higher risk for certain long-term diseases and infections as well as injuries from falls. Falls are a major cause of broken bones and head injuries in people who are older than age 66. Getting regular preventive care can help to keep you healthy and well. Preventive care includes getting regular testing and making lifestyle changes as recommended by your health care provider. Talk with your health care provider about:  Which screenings and tests you should have. A screening is a test that checks for a disease when you have no symptoms.  A diet and exercise plan that is right for you. What should I know about screenings and tests to prevent falls? Screening and testing are the best ways to find a health problem early. Early diagnosis and treatment give you the best chance of managing medical conditions that are common after age 8. Certain conditions and lifestyle choices may make you more likely to have a fall. Your health care provider may recommend:  Regular vision checks. Poor vision and conditions such as cataracts can make you more likely to have a fall. If you wear glasses, make sure to get your prescription updated if your vision changes.  Medicine review. Work with your health care provider to regularly review all of the medicines you are taking, including over-the-counter medicines. Ask your health care provider about any side effects that may make you more likely to have a fall. Tell your health care provider if any medicines that you take make you feel dizzy or sleepy.  Osteoporosis screening. Osteoporosis is a condition that causes the bones to get weaker. This can make the bones weak and cause them to break more easily.  Blood pressure screening. Blood pressure changes and medicines to control blood pressure can make you feel dizzy.  Strength and balance checks. Your health care provider may recommend certain tests to check your  strength and balance while standing, walking, or changing positions.  Foot health exam. Foot pain and numbness, as well as not wearing proper footwear, can make you more likely to have a fall.  Depression screening. You may be more likely to have a fall if you have a fear of falling, feel emotionally low, or feel unable to do activities that you used to do.  Alcohol use screening. Using too much alcohol can affect your balance and may make you more likely to have a fall. What actions can I take to lower my risk of falls? General instructions  Talk with your health care provider about your risks for falling. Tell your health care provider if: ? You fall. Be sure to tell your health care provider about all falls, even ones that seem minor. ? You feel dizzy, sleepy, or off-balance.  Take over-the-counter and prescription medicines only as told by your health care provider. These include any supplements.  Eat a healthy diet and maintain a healthy weight. A healthy diet includes low-fat dairy products, low-fat (lean) meats, and fiber from whole grains, beans, and lots of fruits and vegetables. Home safety  Remove any tripping hazards, such as rugs, cords, and clutter.  Install safety equipment such as grab bars in bathrooms and safety rails on stairs.  Keep rooms and walkways well-lit. Activity   Follow a regular exercise program to stay fit. This will help you maintain your balance. Ask your health care provider what types of exercise are appropriate for you.  If you need a cane or  walker, use it as recommended by your health care provider.  Wear supportive shoes that have nonskid soles. Lifestyle  Do not drink alcohol if your health care provider tells you not to drink.  If you drink alcohol, limit how much you have: ? 0-1 drink a day for women. ? 0-2 drinks a day for men.  Be aware of how much alcohol is in your drink. In the [place] one drink equals one typical bottle of beer (12  oz), one-half glass of wine (5 oz), or one shot of hard liquor (1 oz).  Do not use any products that contain nicotine or tobacco, such as cigarettes and e-cigarettes. If you need help quitting, ask your health care provider. Summary  Having a healthy lifestyle and getting preventive care can help to protect your health and wellness after age 27.  Screening and testing are the best way to find a health problem early and help you avoid having a fall. Early diagnosis and treatment give you the best chance for managing medical conditions that are more common for people who are older than age 13.  Falls are a major cause of broken bones and head injuries in people who are older than age 81. Take precautions to prevent a fall at home.  Work with your health care provider to learn what changes you can make to improve your health and wellness and to prevent falls. This information is not intended to replace advice given to you by your health care provider. Make sure you discuss any questions you have with your health care provider. Document Revised: 05/18/2018 Document Reviewed: 12/08/2016 Elsevier Patient Education  2020 [place] 65 Years and Older, Male Preventive care refers to lifestyle choices and visits with your health care provider that can promote health and wellness. This includes:  A yearly physical exam. This is also called an annual well check.  Regular dental and eye exams.  Immunizations.  Screening for certain conditions.  Healthy lifestyle choices, such as diet and exercise. What can I expect for my preventive care visit? Physical exam Your health care provider will check:  Height and weight. These may be used to calculate body mass index (BMI), which is a measurement that tells if you are at a healthy weight.  Heart rate and blood pressure.  Your skin for abnormal spots. Counseling Your health care provider may ask you questions  about:  Alcohol, tobacco, and drug use.  Emotional well-being.  Home and relationship well-being.  Sexual activity.  Eating habits.  History of falls.  Memory and ability to understand (cognition).  Work and work Statistician. What immunizations do I need?  Influenza (flu) vaccine  This is recommended every year. Tetanus, diphtheria, and pertussis (Tdap) vaccine  You may need a Td booster every 10 years. Varicella (chickenpox) vaccine  You may need this vaccine if you have not already been vaccinated. Zoster (shingles) vaccine  You may need this after age 76. Pneumococcal conjugate (PCV13) vaccine  One dose is recommended after age 49. Pneumococcal polysaccharide (PPSV23) vaccine  One dose is recommended after age 66. Measles, mumps, and rubella (MMR) vaccine  You may need at least one dose of MMR if you were born in 1957 or later. You may also need a second dose. Meningococcal conjugate (MenACWY) vaccine  You may need this if you have certain conditions. Hepatitis A vaccine  You may need this if you have certain conditions or if you travel or work in places where  you may be exposed to hepatitis A. Hepatitis B vaccine  You may need this if you have certain conditions or if you travel or work in places where you may be exposed to hepatitis B. Haemophilus influenzae type b (Hib) vaccine  You may need this if you have certain conditions. You may receive vaccines as individual doses or as more than one vaccine together in one shot (combination vaccines). Talk with your health care provider about the risks and benefits of combination vaccines. What tests do I need? Blood tests  Lipid and cholesterol levels. These may be checked every 5 years, or more frequently depending on your overall health.  Hepatitis C test.  Hepatitis B test. Screening  Lung cancer screening. You may have this screening every year starting at age 78 if you have a 30-pack-year history of  smoking and currently smoke or have quit within the past 15 years.  Colorectal cancer screening. All adults should have this screening starting at age 81 and continuing until age 12. Your health care provider may recommend screening at age 13 if you are at increased risk. You will have tests every 1-10 years, depending on your results and the type of screening test.  Prostate cancer screening. Recommendations will vary depending on your family history and other risks.  Diabetes screening. This is done by checking your blood sugar (glucose) after you have not eaten for a while (fasting). You may have this done every 1-3 years.  Abdominal aortic aneurysm (AAA) screening. You may need this if you are a current or former smoker.  Sexually transmitted disease (STD) testing. Follow these instructions at home: Eating and drinking  Eat a diet that includes fresh fruits and vegetables, whole grains, lean protein, and low-fat dairy products. Limit your intake of foods with high amounts of sugar, saturated fats, and salt.  Take vitamin and mineral supplements as recommended by your health care provider.  Do not drink alcohol if your health care provider tells you not to drink.  If you drink alcohol: ? Limit how much you have to 0-2 drinks a day. ? Be aware of how much alcohol is in your drink. In the [place] one drink equals one 12 oz bottle of beer (355 mL), one 5 oz glass of wine (148 mL), or one 1 oz glass of hard liquor (44 mL). Lifestyle  Take daily care of your teeth and gums.  Stay active. Exercise for at least 30 minutes on 5 or more days each week.  Do not use any products that contain nicotine or tobacco, such as cigarettes, e-cigarettes, and chewing tobacco. If you need help quitting, ask your health care provider.  If you are sexually active, practice safe sex. Use a condom or other form of protection to prevent STIs (sexually transmitted infections).  Talk with your health care  provider about taking a low-dose aspirin or statin. What's next?  Visit your health care provider once a year for a well check visit.  Ask your health care provider how often you should have your eyes and teeth checked.  Stay up to date on all vaccines. This information is not intended to replace advice given to you by your health care provider. Make sure you discuss any questions you have with your health care provider. Document Revised: 01/19/2018 Document Reviewed: 01/19/2018 Elsevier Patient Education  2020 [employer].

## 2019-11-08 NOTE — Progress Notes (Signed)
Established Patient Office Visit  Subjective:  Patient ID: Edward Hawkins, male    DOB: [birth year]  Age: 69 y.o. MRN: [id number]  CC:  Chief Complaint  Patient presents with  . Annual Exam    CPE, no concerns.     HPI Edward Hawkins presents for a complete physical follow-up of hypertension, diabetes, hyperlipidemia.  He has been watching his calories and has been able to lose 10 pounds.  Continues to work as a Presenter, broadcasting.  Recent eye check in August.  Having no problems with his vision, urination or stooling.  Sees the dentist 3 times yearly.  He is nonfasting this morning.  Past Medical History:  Diagnosis Date  . Arthritis   . ASTHMA 10/08/2006  . Asthma    last flareup more than 10 yrs ago.  . Cataract    extractions  . Colon polyps    tubular adenomas  . CVA 10/10/2005  . DIABETES MELLITUS, TYPE II 10/08/2006  . Diverticulosis   . HYPERLIPIDEMIA 10/08/2006  . HYPERTENSION 10/08/2006  . Primary localized osteoarthritis of left knee 12/28/2016  . RECTAL BLEEDING 09/15/2009  . S/P LASIK surgery of both eyes   . Seasonal allergies     Past Surgical History:  Procedure Laterality Date  . CATARACT EXTRACTION, BILATERAL    . COLONOSCOPY    . EYE SURGERY    . FRACTURE SURGERY    . KNEE SURGERY     left knee x2  . PARTIAL KNEE ARTHROPLASTY Left 12/28/2016   Procedure: LEFT UNICOMPARTMENTAL KNEE;  Surgeon: Marchia Bond, MD;  Location: [facility];  Service: Orthopedics;  Laterality: Left;  . RADIOLOGY WITH ANESTHESIA N/A 10/28/2016   Procedure: MRI - CERVICAL SPINE WITHOUT;  Surgeon: Radiologist, Medication, MD;  Location: [facility];  Service: Radiology;  Laterality: N/A;  . Stress Cardiolite  11/05/2005  . TRANSTHORACIC ECHOCARDIOGRAM  10/12/2005    Family History  Problem Relation Age of Onset  . Cancer Brother        Lung Cancer  . Diabetes Mother   . Diabetes Father   . Cancer Daughter        Breast Cancer  . Colon cancer Neg Hx     Social History    Socioeconomic History  . Marital status: Single    Spouse name: Not on file  . Number of children: Not on file  . Years of education: Not on file  . Highest education level: Not on file  Occupational History  . Occupation: Works at [employer]  . Smoking status: Never Smoker  . Smokeless tobacco: Never Used  Vaping Use  . Vaping Use: Never used  Substance and Sexual Activity  . Alcohol use: No  . Drug use: No  . Sexual activity: Not on file  Other Topics Concern  . Not on file  Social History Narrative  . Not on file   Social Determinants of Health   Financial Resource Strain: Low Risk   . Difficulty of Paying Living Expenses: Not hard at all  Food Insecurity: No Food Insecurity  . Worried About Charity fundraiser in the Last Year: Never true  . Ran Out of Food in the Last Year: Never true  Transportation Needs: No Transportation Needs  . Lack of Transportation (Medical): No  . Lack of Transportation (Non-Medical): No  Physical Activity: Insufficiently Active  . Days of Exercise per Week: 3 days  . Minutes of Exercise per Session: 40 min  Stress: No Stress  Concern Present  . Feeling of Stress : Not at all  Social Connections: Socially Isolated  . Frequency of Communication with Friends and Family: More than three times a week  . Frequency of Social Gatherings with Friends and Family: More than three times a week  . Attends Religious Services: Never  . Active Member of Clubs or Organizations: No  . Attends Archivist Meetings: Never  . Marital Status: Never married  Intimate Partner Violence: Not At Risk  . Fear of Current or Ex-Partner: No  . Emotionally Abused: No  . Physically Abused: No  . Sexually Abused: No    Outpatient Medications Prior to Visit  Medication Sig Dispense Refill  . aspirin 325 MG tablet Take 325 mg by mouth daily.    Marland Kitchen atorvastatin (LIPITOR) 20 MG tablet Take 1 tablet by mouth once daily 90 tablet 0  .  Cholecalciferol (EQL VITAMIN D3) 2000 units CAPS Take 2,000 Units daily by mouth.    . diltiazem (CARDIZEM LA) 180 MG 24 hr tablet Take 1 tablet (180 mg total) by mouth daily. 90 tablet 1  . fish oil-omega-3 fatty acids 1000 MG capsule Take 1 g by mouth daily.    . Fluticasone-Salmeterol (ADVAIR) 250-50 MCG/DOSE AEPB INHALE 1 DOSE BY MOUTH ONCE DAILY 60 each 4  . losartan (COZAAR) 100 MG tablet Take 1 tablet by mouth once daily 90 tablet 0  . metFORMIN (GLUCOPHAGE-XR) 500 MG 24 hr tablet TAKE 2 TABLETS BY MOUTH ONCE DAILY WITH BREAKFAST 180 tablet 0  . montelukast (SINGULAIR) 10 MG tablet TAKE 1 TABLET BY MOUTH AT BEDTIME 90 tablet 0  . Multiple Vitamin (MULTIVITAMIN) tablet Take 1 tablet by mouth daily.     . pioglitazone (ACTOS) 15 MG tablet Take 1 tablet by mouth once daily 90 tablet 0  . traMADol (ULTRAM) 50 MG tablet TAKE 1 TO 2 TABLETS BY MOUTH AT BEDTIME AS NEEDED 45 tablet 0   No facility-administered medications prior to visit.    No Known Allergies  ROS Review of Systems  Constitutional: Negative.   HENT: Negative.   Eyes: Negative for photophobia and visual disturbance.  Respiratory: Negative.   Cardiovascular: Negative.   Gastrointestinal: Negative.   Endocrine: Negative for polyphagia and polyuria.  Genitourinary: Negative for difficulty urinating, frequency and urgency.  Musculoskeletal: Negative for gait problem and joint swelling.  Skin: Negative for pallor and rash.  Allergic/Immunologic: Negative for immunocompromised state.  Neurological: Negative for speech difficulty and weakness.  Hematological: Does not bruise/bleed easily.  Psychiatric/Behavioral: Negative.       Objective:    Physical Exam Vitals and nursing note reviewed.  Constitutional:      General: He is not in acute distress.    Appearance: Normal appearance. He is not ill-appearing, toxic-appearing or diaphoretic.  HENT:     Head: Normocephalic and atraumatic.     Right Ear: Tympanic  membrane, ear canal and external ear normal.     Left Ear: Tympanic membrane, ear canal and external ear normal.     Mouth/Throat:     Mouth: Mucous membranes are moist.     Pharynx: Oropharynx is clear. No oropharyngeal exudate or posterior oropharyngeal erythema.  Eyes:     General:        Right eye: No discharge.        Left eye: No discharge.     Extraocular Movements: Extraocular movements intact.     Conjunctiva/sclera: Conjunctivae normal.     Pupils: Pupils are equal, round,  and reactive to light.  Cardiovascular:     Rate and Rhythm: Normal rate and regular rhythm.  Pulmonary:     Effort: Pulmonary effort is normal.     Breath sounds: Normal breath sounds.  Abdominal:     General: Abdomen is flat. Bowel sounds are normal. There is no distension.     Palpations: Abdomen is soft. There is no mass.     Tenderness: There is no abdominal tenderness. There is no guarding or rebound.     Hernia: A hernia is present. Hernia is present in the umbilical area and ventral area.  Genitourinary:    Prostate: Enlarged. Not tender and no nodules present.     Rectum: Guaiac result negative. No mass, tenderness, anal fissure, external hemorrhoid or internal hemorrhoid. Normal anal tone.  Musculoskeletal:     Cervical back: Normal range of motion and neck supple. No rigidity or tenderness.     Right lower leg: No edema.     Left lower leg: No edema.  Lymphadenopathy:     Cervical: No cervical adenopathy.  Skin:    General: Skin is warm and dry.  Neurological:     Mental Status: He is alert and oriented to person, place, and time.  Psychiatric:        Mood and Affect: Mood normal.        Behavior: Behavior normal.     BP 130/62   Pulse 81   Temp (!) 97.1 F (36.2 C) (Tympanic)   Ht 5\' 11"  (1.803 m)   Wt 240 lb (108.9 kg)   SpO2 98%   BMI 33.47 kg/m  Wt Readings from Last 3 Encounters:  11/08/19 240 lb (108.9 kg)  09/13/19 250 lb (113.4 kg)  07/24/19 250 lb (113.4 kg)      Health Maintenance Due  Topic Date Due  . HEMOGLOBIN A1C  10/25/2019    There are no preventive care reminders to display for this patient.  Lab Results  Component Value Date   TSH 3.19 04/24/2019   Lab Results  Component Value Date   WBC 6.6 04/24/2019   HGB 12.9 (L) 04/24/2019   HCT 39.6 04/24/2019   MCV 99.7 04/24/2019   PLT 323.0 04/24/2019   Lab Results  Component Value Date   NA 141 04/24/2019   K 3.9 04/24/2019   CO2 28 04/24/2019   GLUCOSE 129 (H) 04/24/2019   BUN 15 04/24/2019   CREATININE 1.01 04/24/2019   BILITOT 0.4 11/06/2018   ALKPHOS 100 11/06/2018   AST 17 11/06/2018   ALT 15 11/06/2018   PROT 7.2 11/06/2018   ALBUMIN 4.3 11/06/2018   CALCIUM 9.0 04/24/2019   ANIONGAP 6 12/29/2016   GFR 73.20 04/24/2019   Lab Results  Component Value Date   CHOL 136 11/06/2018   Lab Results  Component Value Date   HDL 37.20 (L) 11/06/2018   Lab Results  Component Value Date   LDLCALC 74 11/06/2018   Lab Results  Component Value Date   TRIG 125.0 11/06/2018   Lab Results  Component Value Date   CHOLHDL 4 11/06/2018   Lab Results  Component Value Date   HGBA1C 6.3 04/24/2019      Assessment & Plan:   Problem List Items Addressed This Visit      Cardiovascular and Mediastinum   Essential hypertension   Relevant Orders   CBC   Comprehensive metabolic panel   Urinalysis, Routine w reflex microscopic   Microalbumin / creatinine urine ratio  Endocrine   Type 2 diabetes mellitus without complication, without long-term current use of insulin (HCC)   Relevant Orders   Comprehensive metabolic panel   Hemoglobin A1c   Microalbumin / creatinine urine ratio     Other   Dyslipidemia   Relevant Orders   Comprehensive metabolic panel   Lipid panel   Healthcare maintenance   Relevant Orders   PSA   Macrocytic anemia   Relevant Orders   B12 and Folate Panel   Need for influenza vaccination - Primary   Relevant Orders   Flu Vaccine  QUAD High Dose(Fluad) (Completed)      No orders of the defined types were placed in this encounter.   Follow-up: Return in about 6 months (around 05/07/2020), or Keep up the good work with weight loss!.  Return fasting for above ordered blood work.  Keep up the good work with weight loss.  Information was given on health maintenance and disease prevention and he was advised to follow it.  Continue all medications.  Libby Maw, MD

## 2019-11-12 ENCOUNTER — Other Ambulatory Visit: Payer: PPO

## 2019-11-14 ENCOUNTER — Other Ambulatory Visit: Payer: Self-pay | Admitting: Family Medicine

## 2019-11-14 DIAGNOSIS — G8929 Other chronic pain: Secondary | ICD-10-CM

## 2019-11-14 NOTE — Telephone Encounter (Signed)
Hey Dr Ethelene Hal,  Received a refill request for:  Tramadol 50 mg LR 10/17/19 , #45, 0 rf;s LOV 11/08/19 FOV  05/09/20  Please review and advise.  Thanks. Dm/cma

## 2019-11-15 ENCOUNTER — Other Ambulatory Visit: Payer: Self-pay | Admitting: Family Medicine

## 2019-11-19 ENCOUNTER — Other Ambulatory Visit (INDEPENDENT_AMBULATORY_CARE_PROVIDER_SITE_OTHER): Payer: PPO

## 2019-11-19 ENCOUNTER — Other Ambulatory Visit: Payer: Self-pay

## 2019-11-19 DIAGNOSIS — D539 Nutritional anemia, unspecified: Secondary | ICD-10-CM

## 2019-11-19 DIAGNOSIS — E119 Type 2 diabetes mellitus without complications: Secondary | ICD-10-CM

## 2019-11-19 DIAGNOSIS — E785 Hyperlipidemia, unspecified: Secondary | ICD-10-CM | POA: Diagnosis not present

## 2019-11-19 DIAGNOSIS — I10 Essential (primary) hypertension: Secondary | ICD-10-CM | POA: Diagnosis not present

## 2019-11-19 DIAGNOSIS — Z0000 Encounter for general adult medical examination without abnormal findings: Secondary | ICD-10-CM

## 2019-11-19 LAB — HEMOGLOBIN A1C: Hgb A1c MFr Bld: 6.4 % (ref 4.6–6.5)

## 2019-11-19 LAB — URINALYSIS, ROUTINE W REFLEX MICROSCOPIC
Bilirubin Urine: NEGATIVE
Hgb urine dipstick: NEGATIVE
Ketones, ur: NEGATIVE
Leukocytes,Ua: NEGATIVE
Nitrite: NEGATIVE
RBC / HPF: NONE SEEN (ref 0–?)
Specific Gravity, Urine: 1.025 (ref 1.000–1.030)
Total Protein, Urine: NEGATIVE
Urine Glucose: NEGATIVE
Urobilinogen, UA: 0.2 (ref 0.0–1.0)
pH: 6 (ref 5.0–8.0)

## 2019-11-19 LAB — COMPREHENSIVE METABOLIC PANEL
ALT: 17 U/L (ref 0–53)
AST: 17 U/L (ref 0–37)
Albumin: 4 g/dL (ref 3.5–5.2)
Alkaline Phosphatase: 89 U/L (ref 39–117)
BUN: 20 mg/dL (ref 6–23)
CO2: 27 mEq/L (ref 19–32)
Calcium: 8.9 mg/dL (ref 8.4–10.5)
Chloride: 105 mEq/L (ref 96–112)
Creatinine, Ser: 1.16 mg/dL (ref 0.40–1.50)
GFR: 63.55 mL/min (ref >60.00)
Glucose, Bld: 106 mg/dL — ABNORMAL HIGH (ref 70–99)
Potassium: 4.1 mEq/L (ref 3.5–5.1)
Sodium: 139 mEq/L (ref 135–145)
Total Bilirubin: 0.5 mg/dL (ref 0.2–1.2)
Total Protein: 6.8 g/dL (ref 6.0–8.3)

## 2019-11-19 LAB — CBC
HCT: 35.4 % — ABNORMAL LOW (ref 39.0–52.0)
Hemoglobin: 12 g/dL — ABNORMAL LOW (ref 13.0–17.0)
MCHC: 33.9 g/dL (ref 30.0–36.0)
MCV: 97.3 fl (ref 78.0–100.0)
Platelets: 315 10*3/uL (ref 150.0–400.0)
RBC: 3.64 Mil/uL — ABNORMAL LOW (ref 4.22–5.81)
RDW: 13.3 % (ref 11.5–15.5)
WBC: 7.2 10*3/uL (ref 4.0–10.5)

## 2019-11-19 LAB — LIPID PANEL
Cholesterol: 130 mg/dL (ref 0–200)
HDL: 39.7 mg/dL (ref >39.00)
LDL Cholesterol: 79 mg/dL (ref 0–99)
NonHDL: 90.24
Total CHOL/HDL Ratio: 3
Triglycerides: 58 mg/dL (ref 0.0–149.0)
VLDL: 11.6 mg/dL (ref 0.0–40.0)

## 2019-11-19 LAB — PSA: PSA: 0.54 ng/mL (ref 0.10–4.00)

## 2019-11-19 LAB — MICROALBUMIN / CREATININE URINE RATIO
Creatinine,U: 170.8 mg/dL
Microalb Creat Ratio: 0.4 mg/g (ref 0.0–30.0)
Microalb, Ur: <0.7 mg/dL (ref 0.0–1.9)

## 2019-11-19 LAB — B12 AND FOLATE PANEL
Folate: 23.8 ng/mL (ref >5.9)
Vitamin B-12: 517 pg/mL (ref 211–911)

## 2019-11-24 ENCOUNTER — Ambulatory Visit: Payer: PPO | Attending: Internal Medicine

## 2019-11-24 ENCOUNTER — Other Ambulatory Visit: Payer: Self-pay

## 2019-11-24 DIAGNOSIS — Z23 Encounter for immunization: Secondary | ICD-10-CM

## 2019-11-24 NOTE — Progress Notes (Signed)
   Covid-19 Vaccination Clinic  Name:  Edward Hawkins    MRN: [id number] DOB: 09/20/1950  11/24/2019  Edward Hawkins was observed post Covid-19 immunization for 15 minutes without incident. He was provided with Vaccine Information Sheet and instruction to access the V-Safe system.   Edward Hawkins was instructed to call 911 with any severe reactions post vaccine: Marland Kitchen Difficulty breathing  . Swelling of face and throat  . A fast heartbeat  . A bad rash all over body  . Dizziness and weakness

## 2020-01-02 ENCOUNTER — Other Ambulatory Visit: Payer: Self-pay | Admitting: Family Medicine

## 2020-01-22 ENCOUNTER — Other Ambulatory Visit: Payer: Self-pay | Admitting: Family Medicine

## 2020-01-30 ENCOUNTER — Other Ambulatory Visit: Payer: Self-pay | Admitting: Family Medicine

## 2020-02-12 ENCOUNTER — Other Ambulatory Visit: Payer: Self-pay | Admitting: Family Medicine

## 2020-02-12 DIAGNOSIS — G8929 Other chronic pain: Secondary | ICD-10-CM

## 2020-02-13 NOTE — Telephone Encounter (Signed)
Please see message and advise.  Thank you. ° °

## 2020-02-13 NOTE — Telephone Encounter (Signed)
Last OV 11/08/19 Last fill 11/15/19 #45/2

## 2020-02-19 ENCOUNTER — Other Ambulatory Visit: Payer: Self-pay | Admitting: Family Medicine

## 2020-02-22 ENCOUNTER — Telehealth: Payer: Self-pay

## 2020-02-22 MED ORDER — VALSARTAN 160 MG PO TABS: 160.0000 mg | ORAL_TABLET | Freq: Every day | ORAL | 1 refills | Status: DC

## 2020-02-22 NOTE — Telephone Encounter (Signed)
Received a request form patient's pharmacy, [facility] for a change to another medication due to the Losartan 100 mg is on back order.   Please advise.  Thanks.  Dm/cma

## 2020-02-22 NOTE — Telephone Encounter (Signed)
Patient notified VIA phone. Dm/cma  

## 2020-02-22 NOTE — Telephone Encounter (Signed)
Rx valsartan sent to pharm

## 2020-03-14 ENCOUNTER — Other Ambulatory Visit: Payer: Self-pay | Admitting: Family

## 2020-03-14 DIAGNOSIS — G8929 Other chronic pain: Secondary | ICD-10-CM

## 2020-03-14 DIAGNOSIS — M25562 Pain in left knee: Secondary | ICD-10-CM

## 2020-03-19 DIAGNOSIS — M25562 Pain in left knee: Secondary | ICD-10-CM | POA: Diagnosis not present

## 2020-04-03 ENCOUNTER — Other Ambulatory Visit: Payer: Self-pay | Admitting: Family

## 2020-04-10 ENCOUNTER — Other Ambulatory Visit: Payer: Self-pay | Admitting: Family

## 2020-04-10 DIAGNOSIS — G8929 Other chronic pain: Secondary | ICD-10-CM

## 2020-04-10 DIAGNOSIS — M25562 Pain in left knee: Secondary | ICD-10-CM

## 2020-04-28 ENCOUNTER — Other Ambulatory Visit: Payer: Self-pay | Admitting: Family

## 2020-04-28 ENCOUNTER — Other Ambulatory Visit: Payer: Self-pay | Admitting: Family Medicine

## 2020-04-28 DIAGNOSIS — I10 Essential (primary) hypertension: Secondary | ICD-10-CM

## 2020-04-28 NOTE — Telephone Encounter (Signed)
Last OV 11/08/19   Last fill Diltiazem 07/24/19  #90/0 Last fill Atorvastatin 01/22/20  #90/0 Next OV 05/09/20

## 2020-05-09 ENCOUNTER — Ambulatory Visit: Payer: PPO | Admitting: Family Medicine

## 2020-05-13 ENCOUNTER — Other Ambulatory Visit: Payer: Self-pay | Admitting: Family

## 2020-05-13 DIAGNOSIS — G8929 Other chronic pain: Secondary | ICD-10-CM

## 2020-05-13 DIAGNOSIS — M25562 Pain in left knee: Secondary | ICD-10-CM

## 2020-05-15 ENCOUNTER — Telehealth: Payer: Self-pay | Admitting: Family Medicine

## 2020-05-15 ENCOUNTER — Other Ambulatory Visit: Payer: Self-pay

## 2020-05-15 DIAGNOSIS — G8929 Other chronic pain: Secondary | ICD-10-CM

## 2020-05-15 NOTE — Telephone Encounter (Signed)
Refill for pending Rx last OV 11/08/19, last refill 04/10/20. Please advise.

## 2020-05-15 NOTE — Telephone Encounter (Signed)
What is the name of the medication? traMADol (ULTRAM) 50 MG tablet [[id number]]   Have you contacted your pharmacy to request a refill? He is running out of his med.    Which pharmacy would you like this sent to? [place], [place].  [address] [place] [address]  Phone:  [phone] Fax:  [phone]       Patient notified that their request is being sent to the clinical staff for review and that they should receive a call once it is complete. If they do not receive a call within 72 hours they can check with their pharmacy or our office.

## 2020-05-16 MED ORDER — TRAMADOL HCL 50 MG PO TABS: 50.0000 mg | ORAL_TABLET | Freq: Every evening | ORAL | 0 refills | Status: DC | PRN

## 2020-05-16 NOTE — Telephone Encounter (Signed)
Rx sent in

## 2020-05-29 ENCOUNTER — Other Ambulatory Visit: Payer: Self-pay | Admitting: Family Medicine

## 2020-05-29 ENCOUNTER — Other Ambulatory Visit: Payer: Self-pay | Admitting: Family

## 2020-05-29 DIAGNOSIS — J4520 Mild intermittent asthma, uncomplicated: Secondary | ICD-10-CM

## 2020-06-02 ENCOUNTER — Telehealth: Payer: Self-pay | Admitting: Family Medicine

## 2020-06-02 ENCOUNTER — Other Ambulatory Visit: Payer: Self-pay

## 2020-06-02 MED ORDER — MONTELUKAST SODIUM 10 MG PO TABS: 1.0000 | ORAL_TABLET | Freq: Every day | ORAL | 0 refills | Status: DC

## 2020-06-02 NOTE — Telephone Encounter (Signed)
Refill sent in

## 2020-06-02 NOTE — Telephone Encounter (Signed)
What is the name of the medication? montelukast (SINGULAIR) 10 MG tablet [[id number]]    Have you contacted your pharmacy to request a refill? Yes, he is needing a refill. He is also down to his last pill.  Which pharmacy would you like this sent to? [place], [place].  [address] [place] [address]  Phone:  [phone] Fax:  [phone]       Patient notified that their request is being sent to the clinical staff for review and that they should receive a call once it is complete. If they do not receive a call within 72 hours they can check with their pharmacy or our office.

## 2020-06-09 ENCOUNTER — Other Ambulatory Visit: Payer: Self-pay

## 2020-06-09 ENCOUNTER — Ambulatory Visit (INDEPENDENT_AMBULATORY_CARE_PROVIDER_SITE_OTHER): Payer: PPO | Admitting: Family Medicine

## 2020-06-09 ENCOUNTER — Encounter: Payer: Self-pay | Admitting: Family Medicine

## 2020-06-09 VITALS — BP 126/70 | HR 78 | Temp 98.0°F | Ht 71.0 in | Wt 240.0 lb

## 2020-06-09 DIAGNOSIS — I10 Essential (primary) hypertension: Secondary | ICD-10-CM | POA: Diagnosis not present

## 2020-06-09 DIAGNOSIS — E119 Type 2 diabetes mellitus without complications: Secondary | ICD-10-CM | POA: Diagnosis not present

## 2020-06-09 DIAGNOSIS — E785 Hyperlipidemia, unspecified: Secondary | ICD-10-CM

## 2020-06-09 DIAGNOSIS — D539 Nutritional anemia, unspecified: Secondary | ICD-10-CM

## 2020-06-09 DIAGNOSIS — M25562 Pain in left knee: Secondary | ICD-10-CM | POA: Diagnosis not present

## 2020-06-09 DIAGNOSIS — G8929 Other chronic pain: Secondary | ICD-10-CM | POA: Diagnosis not present

## 2020-06-09 NOTE — Progress Notes (Signed)
Established Patient Office Visit  Subjective:  Patient ID: Edward Hawkins, male    DOB: [birth year]  Age: 70 y.o. MRN: [id number]  CC:  Chief Complaint  Patient presents with  . Follow-up    6 month follow no concerns. Patient not fasting.     HPI Edward Hawkins presents for follow-up of hypertension, type 2 diabetes, elevated cholesterol, chronic knee pain and macrocytic anemia with normal B12 and folate.  He is due for his next colonoscopy this year.  Continues to work in [place] for [employer].  Walks anywhere from 3 to 7 miles per shift.  LDL cholesterol is well controlled with the 20 mg of atorvastatin.  He is taking diltiazem for blood pressure control.  Diabetes controlled well with metformin Actos.  For his chronic knee pain he uses tramadol at night and alternates low-dose ibuprofen and Tylenol.  Past Medical History:  Diagnosis Date  . Arthritis   . ASTHMA 10/08/2006  . Asthma    last flareup more than 10 yrs ago.  . Cataract    extractions  . Colon polyps    tubular adenomas  . CVA 10/10/2005  . DIABETES MELLITUS, TYPE II 10/08/2006  . Diverticulosis   . HYPERLIPIDEMIA 10/08/2006  . HYPERTENSION 10/08/2006  . Primary localized osteoarthritis of left knee 12/28/2016  . RECTAL BLEEDING 09/15/2009  . S/P LASIK surgery of both eyes   . Seasonal allergies     Past Surgical History:  Procedure Laterality Date  . CATARACT EXTRACTION, BILATERAL    . COLONOSCOPY    . EYE SURGERY    . FRACTURE SURGERY    . KNEE SURGERY     left knee x2  . PARTIAL KNEE ARTHROPLASTY Left 12/28/2016   Procedure: LEFT UNICOMPARTMENTAL KNEE;  Surgeon: Marchia Bond, MD;  Location: [facility];  Service: Orthopedics;  Laterality: Left;  . RADIOLOGY WITH ANESTHESIA N/A 10/28/2016   Procedure: MRI - CERVICAL SPINE WITHOUT;  Surgeon: Radiologist, Medication, MD;  Location: [facility];  Service: Radiology;  Laterality: N/A;  . Stress Cardiolite  11/05/2005  . TRANSTHORACIC ECHOCARDIOGRAM  10/12/2005     Family History  Problem Relation Age of Onset  . Cancer Brother        Lung Cancer  . Diabetes Mother   . Diabetes Father   . Cancer Daughter        Breast Cancer  . Colon cancer Neg Hx     Social History   Socioeconomic History  . Marital status: Single    Spouse name: Not on file  . Number of children: Not on file  . Years of education: Not on file  . Highest education level: Not on file  Occupational History  . Occupation: Works at [employer]  . Smoking status: Never Smoker  . Smokeless tobacco: Never Used  Vaping Use  . Vaping Use: Never used  Substance and Sexual Activity  . Alcohol use: No  . Drug use: No  . Sexual activity: Not on file  Other Topics Concern  . Not on file  Social History Narrative  . Not on file   Social Determinants of Health   Financial Resource Strain: Low Risk   . Difficulty of Paying Living Expenses: Not hard at all  Food Insecurity: No Food Insecurity  . Worried About Charity fundraiser in the Last Year: Never true  . Ran Out of Food in the Last Year: Never true  Transportation Needs: No Transportation Needs  . Lack of  Transportation (Medical): No  . Lack of Transportation (Non-Medical): No  Physical Activity: Insufficiently Active  . Days of Exercise per Week: 3 days  . Minutes of Exercise per Session: 40 min  Stress: No Stress Concern Present  . Feeling of Stress : Not at all  Social Connections: Socially Isolated  . Frequency of Communication with Friends and Family: More than three times a week  . Frequency of Social Gatherings with Friends and Family: More than three times a week  . Attends Religious Services: Never  . Active Member of Clubs or Organizations: No  . Attends Archivist Meetings: Never  . Marital Status: Never married  Intimate Partner Violence: Not At Risk  . Fear of Current or Ex-Partner: No  . Emotionally Abused: No  . Physically Abused: No  . Sexually Abused: No     Outpatient Medications Prior to Visit  Medication Sig Dispense Refill  . aspirin 325 MG tablet Take 325 mg by mouth daily.    Marland Kitchen atorvastatin (LIPITOR) 20 MG tablet Take 1 tablet by mouth once daily 90 tablet 0  . Cholecalciferol 50 MCG (2000 UT) CAPS Take 2,000 Units daily by mouth.    . diltiazem (CARDIZEM LA) 180 MG 24 hr tablet Take 1 tablet by mouth once daily 90 tablet 0  . fish oil-omega-3 fatty acids 1000 MG capsule Take 1 g by mouth daily.    . Fluticasone-Salmeterol (ADVAIR) 250-50 MCG/DOSE AEPB INHALE 1 DOSE BY MOUTH ONCE DAILY 60 each 0  . metFORMIN (GLUCOPHAGE-XR) 500 MG 24 hr tablet TAKE 2 TABLETS BY MOUTH ONCE DAILY WITH BREAKFAST 180 tablet 0  . montelukast (SINGULAIR) 10 MG tablet Take 1 tablet (10 mg total) by mouth at bedtime. 90 tablet 0  . Multiple Vitamin (MULTIVITAMIN) tablet Take 1 tablet by mouth daily.     . pioglitazone (ACTOS) 15 MG tablet Take 1 tablet by mouth once daily 90 tablet 0  . traMADol (ULTRAM) 50 MG tablet Take 1-2 tablets (50-100 mg total) by mouth at bedtime as needed. 45 tablet 0  . valsartan (DIOVAN) 160 MG tablet Take 1 tablet (160 mg total) by mouth daily. 90 tablet 1   No facility-administered medications prior to visit.    No Known Allergies  ROS Review of Systems  Constitutional: Negative.   HENT: Negative.   Eyes: Negative for photophobia and visual disturbance.  Respiratory: Negative.   Cardiovascular: Negative.   Gastrointestinal: Negative.   Endocrine: Negative for polyphagia and polyuria.  Genitourinary: Negative.   Musculoskeletal: Positive for arthralgias. Negative for gait problem.  Neurological: Negative for speech difficulty and weakness.  Psychiatric/Behavioral: Negative.       Objective:    Physical Exam Vitals and nursing note reviewed.  Constitutional:      General: He is not in acute distress.    Appearance: Normal appearance. He is diaphoretic. He is not ill-appearing or toxic-appearing.  HENT:     Head:  Normocephalic and atraumatic.     Right Ear: Tympanic membrane, ear canal and external ear normal.     Left Ear: Tympanic membrane, ear canal and external ear normal.     Mouth/Throat:     Mouth: Mucous membranes are moist.     Pharynx: Oropharynx is clear. No oropharyngeal exudate or posterior oropharyngeal erythema.  Eyes:     General: No scleral icterus.       Right eye: No discharge.        Left eye: No discharge.     Extraocular  Movements: Extraocular movements intact.     Conjunctiva/sclera: Conjunctivae normal.     Pupils: Pupils are equal, round, and reactive to light.  Neck:     Vascular: No carotid bruit.  Cardiovascular:     Rate and Rhythm: Normal rate and regular rhythm.  Pulmonary:     Effort: Pulmonary effort is normal.     Breath sounds: Normal breath sounds.  Musculoskeletal:     Cervical back: No rigidity or tenderness.     Right lower leg: No edema.     Left lower leg: No edema.  Lymphadenopathy:     Cervical: No cervical adenopathy.  Skin:    General: Skin is warm.  Neurological:     Mental Status: He is alert and oriented to person, place, and time.  Psychiatric:        Mood and Affect: Mood normal.        Behavior: Behavior normal.     BP 126/70   Pulse 78   Temp 98 F (36.7 C) (Temporal)   Ht 5\' 11"  (1.803 m)   Wt 240 lb (108.9 kg)   SpO2 96%   BMI 33.47 kg/m  Wt Readings from Last 3 Encounters:  06/09/20 240 lb (108.9 kg)  11/08/19 240 lb (108.9 kg)  09/13/19 250 lb (113.4 kg)     Health Maintenance Due  Topic Date Due  . FOOT EXAM  04/22/2020  . HEMOGLOBIN A1C  05/19/2020    There are no preventive care reminders to display for this patient.  Lab Results  Component Value Date   TSH 3.19 04/24/2019   Lab Results  Component Value Date   WBC 7.2 11/19/2019   HGB 12.0 (L) 11/19/2019   HCT 35.4 (L) 11/19/2019   MCV 97.3 11/19/2019   PLT 315.0 11/19/2019   Lab Results  Component Value Date   NA 139 11/19/2019   K 4.1  11/19/2019   CO2 27 11/19/2019   GLUCOSE 106 (H) 11/19/2019   BUN 20 11/19/2019   CREATININE 1.16 11/19/2019   BILITOT 0.5 11/19/2019   ALKPHOS 89 11/19/2019   AST 17 11/19/2019   ALT 17 11/19/2019   PROT 6.8 11/19/2019   ALBUMIN 4.0 11/19/2019   CALCIUM 8.9 11/19/2019   ANIONGAP 6 12/29/2016   GFR 63.55 11/19/2019   Lab Results  Component Value Date   CHOL 130 11/19/2019   Lab Results  Component Value Date   HDL 39.70 11/19/2019   Lab Results  Component Value Date   LDLCALC 79 11/19/2019   Lab Results  Component Value Date   TRIG 58.0 11/19/2019   Lab Results  Component Value Date   CHOLHDL 3 11/19/2019   Lab Results  Component Value Date   HGBA1C 6.4 11/19/2019      Assessment & Plan:   Problem List Items Addressed This Visit      Cardiovascular and Mediastinum   Essential hypertension   Relevant Orders   CBC   Comprehensive metabolic panel   Urinalysis, Routine w reflex microscopic     Endocrine   Type 2 diabetes mellitus without complication, without long-term current use of insulin (HCC) - Primary   Relevant Orders   Comprehensive metabolic panel   Hemoglobin A1c     Other   Dyslipidemia   Relevant Orders   Comprehensive metabolic panel   Lipid panel   Chronic pain of left knee   Macrocytic anemia   Relevant Orders   Iron, TIBC and Ferritin Panel  No orders of the defined types were placed in this encounter.   Follow-up: Return in about 6 months (around 12/10/2020), or TRY TO LOSE SOME WEIGHT. iT WILL HELP YOUR KNEE.Marland Kitchen   Continue all medicines as above and will adjust pending results of labs. Libby Maw, MD

## 2020-06-09 NOTE — Patient Instructions (Signed)
Calorie Counting for Weight Loss Calories are units of energy. Your body needs a certain number of calories from food to keep going throughout the day. When you eat or drink more calories than your body needs, your body stores the extra calories mostly as fat. When you eat or drink fewer calories than your body needs, your body burns fat to get the energy it needs. Calorie counting means keeping track of how many calories you eat and drink each day. Calorie counting can be helpful if you need to lose weight. If you eat fewer calories than your body needs, you should lose weight. Ask your health care provider what a healthy weight is for you. For calorie counting to work, you will need to eat the right number of calories each day to lose a healthy amount of weight per week. A dietitian can help you figure out how many calories you need in a day and will suggest ways to reach your calorie goal.  A healthy amount of weight to lose each week is usually 1-2 lb (0.5-0.9 kg). This usually means that your daily calorie intake should be reduced by 500-750 calories.  Eating 1,200-1,500 calories a day can help most women lose weight.  Eating 1,500-1,800 calories a day can help most men lose weight. What do I need to know about calorie counting? Work with your health care provider or dietitian to determine how many calories you should get each day. To meet your daily calorie goal, you will need to:  Find out how many calories are in each food that you would like to eat. Try to do this before you eat.  Decide how much of the food you plan to eat.  Keep a food log. Do this by writing down what you ate and how many calories it had. To successfully lose weight, it is important to balance calorie counting with a healthy lifestyle that includes regular activity. Where do I find calorie information? The number of calories in a food can be found on a Nutrition Facts label. If a food does not have a Nutrition Facts  label, try to look up the calories online or ask your dietitian for help. Remember that calories are listed per serving. If you choose to have more than one serving of a food, you will have to multiply the calories per serving by the number of servings you plan to eat. For example, the label on a package of bread might say that a serving size is 1 slice and that there are 90 calories in a serving. If you eat 1 slice, you will have eaten 90 calories. If you eat 2 slices, you will have eaten 180 calories.   How do I keep a food log? After each time that you eat, record the following in your food log as soon as possible:  What you ate. Be sure to include toppings, sauces, and other extras on the food.  How much you ate. This can be measured in cups, ounces, or number of items.  How many calories were in each food and drink.  The total number of calories in the food you ate. Keep your food log near you, such as in a pocket-sized notebook or on an app or website on your mobile phone. Some programs will calculate calories for you and show you how many calories you have left to meet your daily goal. What are some portion-control tips?  Know how many calories are in a serving. This will   help you know how many servings you can have of a certain food.  Use a measuring cup to measure serving sizes. You could also try weighing out portions on a kitchen scale. With time, you will be able to estimate serving sizes for some foods.  Take time to put servings of different foods on your favorite plates or in your favorite bowls and cups so you know what a serving looks like.  Try not to eat straight from a food's packaging, such as from a bag or box. Eating straight from the package makes it hard to see how much you are eating and can lead to overeating. Put the amount you would like to eat in a cup or on a plate to make sure you are eating the right portion.  Use smaller plates, glasses, and bowls for smaller  portions and to prevent overeating.  Try not to multitask. For example, avoid watching TV or using your computer while eating. If it is time to eat, sit down at a table and enjoy your food. This will help you recognize when you are full. It will also help you be more mindful of what and how much you are eating. What are tips for following this plan? Reading food labels  Check the calorie count compared with the serving size. The serving size may be smaller than what you are used to eating.  Check the source of the calories. Try to choose foods that are high in protein, fiber, and vitamins, and low in saturated fat, trans fat, and sodium. Shopping  Read nutrition labels while you shop. This will help you make healthy decisions about which foods to buy.  Pay attention to nutrition labels for low-fat or fat-free foods. These foods sometimes have the same number of calories or more calories than the full-fat versions. They also often have added sugar, starch, or salt to make up for flavor that was removed with the fat.  Make a grocery list of lower-calorie foods and stick to it. Cooking  Try to cook your favorite foods in a healthier way. For example, try baking instead of frying.  Use low-fat dairy products. Meal planning  Use more fruits and vegetables. One-half of your plate should be fruits and vegetables.  Include lean proteins, such as chicken, turkey, and fish. Lifestyle Each week, aim to do one of the following:  150 minutes of moderate exercise, such as walking.  75 minutes of vigorous exercise, such as running. General information  Know how many calories are in the foods you eat most often. This will help you calculate calorie counts faster.  Find a way of tracking calories that works for you. Get creative. Try different apps or programs if writing down calories does not work for you. What foods should I eat?  Eat nutritious foods. It is better to have a nutritious,  high-calorie food, such as an avocado, than a food with few nutrients, such as a bag of potato chips.  Use your calories on foods and drinks that will fill you up and will not leave you hungry soon after eating. ? Examples of foods that fill you up are nuts and nut butters, vegetables, lean proteins, and high-fiber foods such as whole grains. High-fiber foods are foods with more than 5 g of fiber per serving.  Pay attention to calories in drinks. Low-calorie drinks include water and unsweetened drinks. The items listed above may not be a complete list of foods and beverages you can eat.   Contact a dietitian for more information.   What foods should I limit? Limit foods or drinks that are not good sources of vitamins, minerals, or protein or that are high in unhealthy fats. These include:  Candy.  Other sweets.  Sodas, specialty coffee drinks, alcohol, and juice. The items listed above may not be a complete list of foods and beverages you should avoid. Contact a dietitian for more information. How do I count calories when eating out?  Pay attention to portions. Often, portions are much larger when eating out. Try these tips to keep portions smaller: ? Consider sharing a meal instead of getting your own. ? If you get your own meal, eat only half of it. Before you start eating, ask for a container and put half of your meal into it. ? When available, consider ordering smaller portions from the menu instead of full portions.  Pay attention to your food and drink choices. Knowing the way food is cooked and what is included with the meal can help you eat fewer calories. ? If calories are listed on the menu, choose the lower-calorie options. ? Choose dishes that include vegetables, fruits, whole grains, low-fat dairy products, and lean proteins. ? Choose items that are boiled, broiled, grilled, or steamed. Avoid items that are buttered, battered, fried, or served with cream sauce. Items labeled as  crispy are usually fried, unless stated otherwise. ? Choose water, low-fat milk, unsweetened iced tea, or other drinks without added sugar. If you want an alcoholic beverage, choose a lower-calorie option, such as a glass of wine or light beer. ? Ask for dressings, sauces, and syrups on the side. These are usually high in calories, so you should limit the amount you eat. ? If you want a salad, choose a garden salad and ask for grilled meats. Avoid extra toppings such as bacon, cheese, or fried items. Ask for the dressing on the side, or ask for olive oil and vinegar or lemon to use as dressing.  Estimate how many servings of a food you are given. Knowing serving sizes will help you be aware of how much food you are eating at restaurants. Where to find more information  Centers for Disease Control and Prevention: www.cdc.gov  U.[place]. Department of Agriculture: myplate.gov Summary  Calorie counting means keeping track of how many calories you eat and drink each day. If you eat fewer calories than your body needs, you should lose weight.  A healthy amount of weight to lose per week is usually 1-2 lb (0.5-0.9 kg). This usually means reducing your daily calorie intake by 500-750 calories.  The number of calories in a food can be found on a Nutrition Facts label. If a food does not have a Nutrition Facts label, try to look up the calories online or ask your dietitian for help.  Use smaller plates, glasses, and bowls for smaller portions and to prevent overeating.  Use your calories on foods and drinks that will fill you up and not leave you hungry shortly after a meal. This information is not intended to replace advice given to you by your health care provider. Make sure you discuss any questions you have with your health care provider. Document Revised: 03/08/2019 Document Reviewed: 03/08/2019 Elsevier Patient Education  2021 Elsevier Inc.  

## 2020-06-11 ENCOUNTER — Other Ambulatory Visit: Payer: Self-pay

## 2020-06-11 ENCOUNTER — Other Ambulatory Visit (INDEPENDENT_AMBULATORY_CARE_PROVIDER_SITE_OTHER): Payer: PPO

## 2020-06-11 DIAGNOSIS — I10 Essential (primary) hypertension: Secondary | ICD-10-CM

## 2020-06-11 DIAGNOSIS — E119 Type 2 diabetes mellitus without complications: Secondary | ICD-10-CM

## 2020-06-11 DIAGNOSIS — D539 Nutritional anemia, unspecified: Secondary | ICD-10-CM | POA: Diagnosis not present

## 2020-06-11 DIAGNOSIS — E785 Hyperlipidemia, unspecified: Secondary | ICD-10-CM | POA: Diagnosis not present

## 2020-06-11 LAB — COMPREHENSIVE METABOLIC PANEL
ALT: 16 U/L (ref 0–53)
AST: 17 U/L (ref 0–37)
Albumin: 4.1 g/dL (ref 3.5–5.2)
Alkaline Phosphatase: 83 U/L (ref 39–117)
BUN: 23 mg/dL (ref 6–23)
CO2: 28 mEq/L (ref 19–32)
Calcium: 9.1 mg/dL (ref 8.4–10.5)
Chloride: 104 mEq/L (ref 96–112)
Creatinine, Ser: 1.07 mg/dL (ref 0.40–1.50)
GFR: 70.4 mL/min (ref >60.00)
Glucose, Bld: 104 mg/dL — ABNORMAL HIGH (ref 70–99)
Potassium: 4.6 mEq/L (ref 3.5–5.1)
Sodium: 139 mEq/L (ref 135–145)
Total Bilirubin: 0.5 mg/dL (ref 0.2–1.2)
Total Protein: 6.8 g/dL (ref 6.0–8.3)

## 2020-06-11 LAB — CBC
HCT: 34 % — ABNORMAL LOW (ref 39.0–52.0)
Hemoglobin: 11.6 g/dL — ABNORMAL LOW (ref 13.0–17.0)
MCHC: 34.2 g/dL (ref 30.0–36.0)
MCV: 96.7 fl (ref 78.0–100.0)
Platelets: 303 10*3/uL (ref 150.0–400.0)
RBC: 3.51 Mil/uL — ABNORMAL LOW (ref 4.22–5.81)
RDW: 13.2 % (ref 11.5–15.5)
WBC: 5.5 10*3/uL (ref 4.0–10.5)

## 2020-06-11 LAB — LIPID PANEL
Cholesterol: 138 mg/dL (ref 0–200)
HDL: 38.2 mg/dL — ABNORMAL LOW (ref >39.00)
LDL Cholesterol: 88 mg/dL (ref 0–99)
NonHDL: 99.81
Total CHOL/HDL Ratio: 4
Triglycerides: 57 mg/dL (ref 0.0–149.0)
VLDL: 11.4 mg/dL (ref 0.0–40.0)

## 2020-06-11 LAB — URINALYSIS, ROUTINE W REFLEX MICROSCOPIC
Bilirubin Urine: NEGATIVE
Hgb urine dipstick: NEGATIVE
Ketones, ur: NEGATIVE
Leukocytes,Ua: NEGATIVE
Nitrite: NEGATIVE
RBC / HPF: NONE SEEN (ref 0–?)
Specific Gravity, Urine: 1.01 (ref 1.000–1.030)
Total Protein, Urine: NEGATIVE
Urine Glucose: NEGATIVE
Urobilinogen, UA: 0.2 (ref 0.0–1.0)
pH: 6.5 (ref 5.0–8.0)

## 2020-06-11 LAB — HEMOGLOBIN A1C: Hgb A1c MFr Bld: 6.3 % (ref 4.6–6.5)

## 2020-06-11 NOTE — Progress Notes (Signed)
Per orders of PCP Dr. Ethelene Hal pt is here for labs, tolerated  draw well.

## 2020-06-12 LAB — IRON,TIBC AND FERRITIN PANEL
%SAT: 34 % (calc) (ref 20–48)
Ferritin: 32 ng/mL (ref 24–380)
Iron: 124 ug/dL (ref 50–180)
TIBC: 366 mcg/dL (calc) (ref 250–425)

## 2020-06-13 ENCOUNTER — Telehealth: Payer: Self-pay | Admitting: Family Medicine

## 2020-06-13 ENCOUNTER — Other Ambulatory Visit: Payer: Self-pay | Admitting: Family Medicine

## 2020-06-13 DIAGNOSIS — G8929 Other chronic pain: Secondary | ICD-10-CM

## 2020-06-13 DIAGNOSIS — M25562 Pain in left knee: Secondary | ICD-10-CM

## 2020-06-13 NOTE — Telephone Encounter (Signed)
What is the name of the medication? traMADol (ULTRAM) 50 MG tablet [[id number]]    Have you contacted your pharmacy to request a refill? Katrina from [facility] is needing more information about his dx for pain to be able to get this script. Katrina  said we could fax over his Pt Profile.  Which pharmacy would you like this sent to? [facility], [place].  [address] [place] [address]  Phone:  [phone] Fax:  [phone]       Patient notified that their request is being sent to the clinical staff for review and that they should receive a call once it is complete. If they do not receive a call within 72 hours they can check with their pharmacy or our office.

## 2020-06-16 NOTE — Telephone Encounter (Signed)
Pt is checking on status of this message, he is down to his last pill. Please advise.

## 2020-06-16 NOTE — Telephone Encounter (Signed)
Spoke with patient who verbally understood Rx will be filled. Per patient he does see orthopedics annually his last appointment was in February 2022 with Dr. Lavena Stanford at [facility]

## 2020-06-16 NOTE — Telephone Encounter (Signed)
Refills sent in for Tramadol but [facility] will not fill Rx states that they need more information supporting reason for patient to continue taking this medication. Would like to speak with doctor to discuss alternatives and possibly having patient to come off medication. Katrina at [facility] would like to speak with Dr. Ethelene Hal regarding this matter contact number [phone]

## 2020-06-16 NOTE — Addendum Note (Signed)
Addended by: Jon Billings on: 06/16/2020 04:51 PM   Modules accepted: Orders

## 2020-06-16 NOTE — Telephone Encounter (Signed)
I am assouming that he saw Dr Mardelle Matte for another reason. I have referred him back to him for his chronic knee pain.

## 2020-06-17 NOTE — Telephone Encounter (Signed)
Patient aware of message below.

## 2020-06-18 ENCOUNTER — Telehealth: Payer: Self-pay | Admitting: Family Medicine

## 2020-06-18 DIAGNOSIS — U071 COVID-19: Secondary | ICD-10-CM

## 2020-06-18 MED ORDER — NIRMATRELVIR/RITONAVIR (PAXLOVID)TABLET: 3.0000 | ORAL_TABLET | Freq: Two times a day (BID) | ORAL | 0 refills | Status: AC

## 2020-06-18 NOTE — Telephone Encounter (Signed)
Patient tested positive for COVID. He wanted to make the provider aware because he is asthmatic. Wants to know if there is anything that he needs to be doing. Please call [phone] and advise.

## 2020-06-18 NOTE — Telephone Encounter (Signed)
Patient aware of message below and will pick up Rx today 

## 2020-06-18 NOTE — Telephone Encounter (Signed)
Pt's wife calling again. She needs to know if patient should go the infusion clinic or make appt for virtual visit. She is concerned because he is asthmatic. Please advise & thank you

## 2020-06-18 NOTE — Telephone Encounter (Signed)
Spoke with patient and wife who would like to know if "Monoclonal antibodies" could be sent in for patient do to positive covid test and current asthmatic patient. Informed patient and wife that patient would need to evaluated via virtual visit and due to our office being book the best thing to do is go to urgent care for possible treatment. Please advise.

## 2020-06-18 NOTE — Telephone Encounter (Signed)
Reviewed pts chart and he is high risk of complication/hospitalization d/t covid (age, HTN, DM, lung disease, obesity. Pt is vaccinated and 1 booster. Monoclonal antibody infusion is not necessary but will send in Rx for oral antiviral med paxlovid to pharmacy   Lab Results  Component Value Date   NA 139 06/11/2020   K 4.6 06/11/2020   CREATININE 1.07 06/11/2020   GFRNONAA >60 12/29/2016   GFRAA >60 12/29/2016   GLUCOSE 104 (H) 06/11/2020

## 2020-07-14 ENCOUNTER — Other Ambulatory Visit: Payer: Self-pay | Admitting: Family

## 2020-07-15 ENCOUNTER — Telehealth: Payer: Self-pay

## 2020-07-15 MED ORDER — PIOGLITAZONE HCL 15 MG PO TABS: 15.0000 mg | ORAL_TABLET | Freq: Every day | ORAL | 0 refills | Status: DC

## 2020-07-15 NOTE — Telephone Encounter (Signed)
Last OV 06/09/20 Last fill 04/03/20  #90/0

## 2020-08-01 ENCOUNTER — Other Ambulatory Visit: Payer: Self-pay | Admitting: Family

## 2020-08-01 ENCOUNTER — Other Ambulatory Visit: Payer: Self-pay | Admitting: Family Medicine

## 2020-08-01 DIAGNOSIS — I10 Essential (primary) hypertension: Secondary | ICD-10-CM

## 2020-08-05 ENCOUNTER — Telehealth: Payer: Self-pay | Admitting: Family Medicine

## 2020-08-05 MED ORDER — METFORMIN HCL ER 500 MG PO TB24: ORAL_TABLET | ORAL | 0 refills | Status: DC

## 2020-08-05 NOTE — Telephone Encounter (Signed)
Done

## 2020-08-05 NOTE — Telephone Encounter (Signed)
What is the name of the medication? metFORMIN (GLUCOPHAGE-XR) 500 MG 24 hr tablet [[id number]]  Have you contacted your pharmacy to request a refill? He is needing a refill on this script. He has one pill left. Please advise  Which pharmacy would you like this sent to? [facility], [place].  [address] [place] [address]  Phone:  [phone]  Fax:  [phone]     Patient notified that their request is being sent to the clinical staff for review and that they should receive a call once it is complete. If they do not receive a call within 72 hours they can check with their pharmacy or our office.

## 2020-08-12 ENCOUNTER — Other Ambulatory Visit: Payer: Self-pay | Admitting: Family Medicine

## 2020-08-12 DIAGNOSIS — J4520 Mild intermittent asthma, uncomplicated: Secondary | ICD-10-CM

## 2020-08-31 ENCOUNTER — Other Ambulatory Visit: Payer: Self-pay | Admitting: Family Medicine

## 2020-09-09 ENCOUNTER — Other Ambulatory Visit: Payer: Self-pay | Admitting: Family Medicine

## 2020-09-09 DIAGNOSIS — G8929 Other chronic pain: Secondary | ICD-10-CM

## 2020-09-15 DIAGNOSIS — E119 Type 2 diabetes mellitus without complications: Secondary | ICD-10-CM | POA: Diagnosis not present

## 2020-09-15 DIAGNOSIS — Z961 Presence of intraocular lens: Secondary | ICD-10-CM | POA: Diagnosis not present

## 2020-09-15 DIAGNOSIS — H524 Presbyopia: Secondary | ICD-10-CM | POA: Diagnosis not present

## 2020-09-15 LAB — HM DIABETES EYE EXAM

## 2020-09-30 ENCOUNTER — Other Ambulatory Visit: Payer: Self-pay | Admitting: Family Medicine

## 2020-10-09 ENCOUNTER — Telehealth: Payer: Self-pay | Admitting: Family Medicine

## 2020-10-09 ENCOUNTER — Other Ambulatory Visit: Payer: Self-pay | Admitting: Family Medicine

## 2020-10-09 DIAGNOSIS — J4520 Mild intermittent asthma, uncomplicated: Secondary | ICD-10-CM

## 2020-10-09 DIAGNOSIS — M25562 Pain in left knee: Secondary | ICD-10-CM

## 2020-10-09 DIAGNOSIS — G8929 Other chronic pain: Secondary | ICD-10-CM

## 2020-10-09 NOTE — Telephone Encounter (Signed)
Please advise message below  °

## 2020-10-09 NOTE — Telephone Encounter (Signed)
Katrina the pharmacist from [facility] on  [address] is reaching out to speak with Dr. Ethelene Hal. She filled pt's traMADol (ULTRAM) 50 MG tablet [id number] today, but she would like to discuss what to do going forward. She knows he is on this script for a Partial Knee Replacement. She thought he was going to an ortho specialist, so he would taper off this script. Please advise Katrina at [phone].

## 2020-10-16 ENCOUNTER — Other Ambulatory Visit: Payer: Self-pay | Admitting: Family Medicine

## 2020-10-16 ENCOUNTER — Telehealth: Payer: Self-pay | Admitting: Lab

## 2020-10-16 NOTE — Chronic Care Management (AMB) (Signed)
  Chronic Care Management   Note  10/16/2020 Name: Quran Dimiceli MRN: [id number] DOB: 06/29/1950  Jerardo Markwood is a 70 y.o. year old male who is a primary care patient of Libby Maw, MD. I reached out to [facility] by phone today in response to a referral sent by Mr. Lamorris Costabile's PCP, Libby Maw, MD.   Mr. Scherman was given information about Chronic Care Management services today including:  CCM service includes personalized support from designated clinical staff supervised by his physician, including individualized plan of care and coordination with other care providers 24/7 contact phone numbers for assistance for urgent and routine care needs. Service will only be billed when office clinical staff spend 20 minutes or more in a month to coordinate care. Only one practitioner may furnish and bill the service in a calendar month. The patient may stop CCM services at any time (effective at the end of the month) by phone call to the office staff.   Patient agreed to services and verbal consent obtained.   Follow up plan: [place]

## 2020-10-31 ENCOUNTER — Other Ambulatory Visit: Payer: Self-pay | Admitting: Family Medicine

## 2020-10-31 DIAGNOSIS — I10 Essential (primary) hypertension: Secondary | ICD-10-CM

## 2020-10-31 NOTE — Telephone Encounter (Signed)
Chart supports rx refill Last ov: 06/09/2020 Last refill: n/a

## 2020-11-06 ENCOUNTER — Other Ambulatory Visit: Payer: Self-pay | Admitting: Family Medicine

## 2020-11-06 DIAGNOSIS — M25562 Pain in left knee: Secondary | ICD-10-CM

## 2020-11-06 DIAGNOSIS — G8929 Other chronic pain: Secondary | ICD-10-CM

## 2020-11-06 DIAGNOSIS — I10 Essential (primary) hypertension: Secondary | ICD-10-CM

## 2020-11-21 ENCOUNTER — Telehealth: Payer: Self-pay | Admitting: Pharmacist

## 2020-11-21 NOTE — Chronic Care Management (AMB) (Signed)
    Chronic Care Management Pharmacy Assistant   Name: Ishaq Maffei  MRN: [id number] DOB: [birth year]  Alik Mawson is an 70 y.o. year old male who presents for his initial CCM visit with the clinical pharmacist.  Recent office visits:  06/09/20-William Krystal Clark, MD (PCP) 6 month follow up visit. Labs ordered. Follow up in 6 months.  Recent consult visits:  09/15/20-Mark Hazle Nordmann (Ophthalmology) Diabetic eye exam. Follow up in 1 year.  Hospital visits:  None in previous 6 months  Medications: Outpatient Encounter Medications as of 11/21/2020  Medication Sig   aspirin 325 MG tablet Take 325 mg by mouth daily.   atorvastatin (LIPITOR) 20 MG tablet Take 1 tablet by mouth once daily   Cholecalciferol 50 MCG (2000 UT) CAPS Take 2,000 Units daily by mouth.   diltiazem (CARDIZEM LA) 180 MG 24 hr tablet Take 1 tablet by mouth once daily   fish oil-omega-3 fatty acids 1000 MG capsule Take 1 g by mouth daily.   fluticasone-salmeterol (ADVAIR) 250-50 MCG/ACT AEPB INHALE 1 DOSE BY MOUTH ONCE DAILY   metFORMIN (GLUCOPHAGE-XR) 500 MG 24 hr tablet TAKE 2 TABLETS BY MOUTH ONCE DAILY WITH BREAKFAST   montelukast (SINGULAIR) 10 MG tablet TAKE 1 TABLET BY MOUTH AT BEDTIME   Multiple Vitamin (MULTIVITAMIN) tablet Take 1 tablet by mouth daily.    pioglitazone (ACTOS) 15 MG tablet Take 1 tablet by mouth once daily   traMADol (ULTRAM) 50 MG tablet TAKE 1 TO 2 TABLETS BY MOUTH AT BEDTIME AS NEEDED   valsartan (DIOVAN) 160 MG tablet Take 1 tablet by mouth once daily   No facility-administered encounter medications on file as of 11/21/2020.   Aspirin 325 MG tablet Last filled:None noted Atorvastatin (LIPITOR) 20 MG tablet Last filled:10/31/20 90 DS Cholecalciferol 50 MCG (2000 UT) CAPS Last filled:None noted Diltiazem (CARDIZEM LA) 180 MG 24 hr tablet Last filled:11/06/20 90 DS Fish oil-omega-3 fatty acids 1000 MG capsule Last filled:None noted Fluticasone-salmeterol (ADVAIR)  250-50 MCG/ACT AEPB Last filled:10/09/20 60 DS MetFORMIN (GLUCOPHAGE-XR) 500 MG 24 hr tablet Last filled:11/06/20 90 DS Montelukast (SINGULAIR) 10 MG tablet Last filled:09/30/20 90 DS Pioglitazone (ACTOS) 15 MG tablet Last filled:10/17/20 90 DS TraMADol (ULTRAM) 50 MG tablet Last filled:11/06/20 23 DS Valsartan (DIOVAN) 160 MG tablet Last filled:09/01/20 90 DS   Care Gaps: Zoster Vaccines- Shingrix:Never done COVID-19 Vaccine:Last completed: Nov 24, 2019 FOOT EXAM:Last completed: Apr 23, 2019 INFLUENZA VACCINE:Last completed: Nov 08, 2019  Star Rating Drugs: Atorvastatin (LIPITOR) 20 MG tablet Last filled:10/31/20 90 DS Valsartan (DIOVAN) 160 MG tablet Last filled:09/01/20 90 DS  Corrie Mckusick, [place]

## 2020-11-24 ENCOUNTER — Ambulatory Visit: Payer: PPO | Attending: Internal Medicine

## 2020-11-24 ENCOUNTER — Other Ambulatory Visit: Payer: Self-pay

## 2020-11-24 ENCOUNTER — Other Ambulatory Visit (HOSPITAL_BASED_OUTPATIENT_CLINIC_OR_DEPARTMENT_OTHER): Payer: Self-pay

## 2020-11-24 ENCOUNTER — Ambulatory Visit: Payer: PPO

## 2020-11-24 DIAGNOSIS — Z23 Encounter for immunization: Secondary | ICD-10-CM

## 2020-11-24 MED ORDER — PFIZER COVID-19 VAC BIVALENT 30 MCG/0.3ML IM SUSP
INTRAMUSCULAR | 0 refills | Status: DC
  Filled 2020-11-24: qty 0.3, 1d supply, fill #0

## 2020-11-24 NOTE — Progress Notes (Signed)
   Covid-19 Vaccination Clinic  Name:  Quintavious Rinck    MRN: [id number] DOB: June 06, 1950  11/24/2020  Mr. Deberry was observed post Covid-19 immunization for 15 minutes without incident. He was provided with Vaccine Information Sheet and instruction to access the V-Safe system.   Mr. Mizer was instructed to call 911 with any severe reactions post vaccine: Difficulty breathing  Swelling of face and throat  A fast heartbeat  A bad rash all over body  Dizziness and weakness

## 2020-12-02 ENCOUNTER — Other Ambulatory Visit: Payer: Self-pay

## 2020-12-02 MED ORDER — VALSARTAN 160 MG PO TABS: 160.0000 mg | ORAL_TABLET | Freq: Every day | ORAL | 0 refills | Status: DC

## 2020-12-03 ENCOUNTER — Other Ambulatory Visit: Payer: Self-pay | Admitting: Family Medicine

## 2020-12-03 DIAGNOSIS — M25562 Pain in left knee: Secondary | ICD-10-CM

## 2020-12-05 ENCOUNTER — Telehealth: Payer: PPO

## 2020-12-15 ENCOUNTER — Encounter: Payer: PPO | Admitting: Family Medicine

## 2020-12-16 ENCOUNTER — Other Ambulatory Visit: Payer: Self-pay | Admitting: Family Medicine

## 2020-12-16 DIAGNOSIS — J4520 Mild intermittent asthma, uncomplicated: Secondary | ICD-10-CM

## 2020-12-24 ENCOUNTER — Other Ambulatory Visit: Payer: Self-pay

## 2020-12-25 ENCOUNTER — Ambulatory Visit (INDEPENDENT_AMBULATORY_CARE_PROVIDER_SITE_OTHER): Payer: PPO | Admitting: Family Medicine

## 2020-12-25 ENCOUNTER — Encounter: Payer: Self-pay | Admitting: Internal Medicine

## 2020-12-25 ENCOUNTER — Encounter: Payer: Self-pay | Admitting: Family Medicine

## 2020-12-25 VITALS — BP 122/68 | HR 97 | Temp 98.0°F | Ht 71.0 in | Wt 234.6 lb

## 2020-12-25 DIAGNOSIS — M25562 Pain in left knee: Secondary | ICD-10-CM

## 2020-12-25 DIAGNOSIS — E785 Hyperlipidemia, unspecified: Secondary | ICD-10-CM

## 2020-12-25 DIAGNOSIS — G8929 Other chronic pain: Secondary | ICD-10-CM

## 2020-12-25 DIAGNOSIS — E119 Type 2 diabetes mellitus without complications: Secondary | ICD-10-CM

## 2020-12-25 DIAGNOSIS — Z23 Encounter for immunization: Secondary | ICD-10-CM

## 2020-12-25 DIAGNOSIS — I10 Essential (primary) hypertension: Secondary | ICD-10-CM | POA: Diagnosis not present

## 2020-12-25 DIAGNOSIS — Z0000 Encounter for general adult medical examination without abnormal findings: Secondary | ICD-10-CM | POA: Diagnosis not present

## 2020-12-25 NOTE — Progress Notes (Signed)
Established Patient Office Visit  Subjective:  Patient ID: Edward Hawkins, male    DOB: 10-04-1950  Age: 70 y.o. MRN: [id number]  CC:  Chief Complaint  Patient presents with   Annual Exam    CPE, no concerns. Patient not fasting.     HPI Edward Hawkins presents for a physical exam and follow-up of hypertension and diabetes elevated cholesterol and chronic left knee pain.  Continues to use tramadol at night to help him sleep.  Blood pressure has been controlled with Diovan and diltiazem.  Continues metformin and Actos for diabetes.  Cholesterol controlled with atorvastatin.  Recent eye check.  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  Past Medical History:  Diagnosis Date   Arthritis    ASTHMA 10/08/2006   Asthma    last flareup more than 10 yrs ago.   Cataract    extractions   Colon polyps    tubular adenomas   CVA 10/10/2005   DIABETES MELLITUS, TYPE II 10/08/2006   Diverticulosis    HYPERLIPIDEMIA 10/08/2006   HYPERTENSION 10/08/2006   Primary localized osteoarthritis of left knee 12/28/2016   RECTAL BLEEDING 09/15/2009   S/P LASIK surgery of both eyes    Seasonal allergies     Past Surgical History:  Procedure Laterality Date   CATARACT EXTRACTION, BILATERAL     COLONOSCOPY     EYE SURGERY     FRACTURE SURGERY     KNEE SURGERY     left knee x2   PARTIAL KNEE ARTHROPLASTY Left 12/28/2016   Procedure: LEFT UNICOMPARTMENTAL KNEE;  Surgeon: Marchia Bond, MD;  Location: [place];  Service: Orthopedics;  Laterality: Left;   RADIOLOGY WITH ANESTHESIA N/A 10/28/2016   Procedure: MRI - CERVICAL SPINE WITHOUT;  Surgeon: Radiologist, Medication, MD;  Location: [place];  Service: Radiology;  Laterality: N/A;   Stress Cardiolite  11/05/2005   TRANSTHORACIC ECHOCARDIOGRAM  10/12/2005    Family History  Problem Relation Age of Onset   Cancer Brother        Lung Cancer   Diabetes Mother    Diabetes Father    Cancer Daughter        Breast  Cancer   Colon cancer Neg Hx     Social History   Socioeconomic History   Marital status: Single    Spouse name: Not on file   Number of children: Not on file   Years of education: Not on file   Highest education level: Not on file  Occupational History   Occupation: Works at [employer] Use   Smoking status: Never   Smokeless tobacco: Never  Vaping Use   Vaping Use: Never used  Substance and Sexual Activity   Alcohol use: No   Drug use: No   Sexual activity: Not on file  Other Topics Concern   Not on file  Social History Narrative   Not on file   Social Determinants of Health   Financial Resource Strain: Not on file  Food Insecurity: Not on file  Transportation Needs: Not on file  Physical Activity: Not on file  Stress: Not on file  Social Connections: Not on file  Intimate Partner Violence: Not on file    Outpatient Medications Prior to Visit  Medication Sig Dispense Refill   aspirin 325 MG tablet Take 325 mg by mouth daily.     atorvastatin (LIPITOR) 20 MG tablet Take 1 tablet by mouth once daily 90 tablet 0   Cholecalciferol 50  MCG (2000 UT) CAPS Take 2,000 Units daily by mouth.     diltiazem (CARDIZEM LA) 180 MG 24 hr tablet Take 1 tablet by mouth once daily 90 tablet 0   fish oil-omega-3 fatty acids 1000 MG capsule Take 1 g by mouth daily.     fluticasone-salmeterol (ADVAIR) 250-50 MCG/ACT AEPB INHALE 1 PUFF INTO LUNGS ONCE DAILY 60 each 0   metFORMIN (GLUCOPHAGE-XR) 500 MG 24 hr tablet TAKE 2 TABLETS BY MOUTH ONCE DAILY WITH BREAKFAST 180 tablet 0   montelukast (SINGULAIR) 10 MG tablet TAKE 1 TABLET BY MOUTH AT BEDTIME 90 tablet 0   Multiple Vitamin (MULTIVITAMIN) tablet Take 1 tablet by mouth daily.      pioglitazone (ACTOS) 15 MG tablet Take 1 tablet by mouth once daily 90 tablet 0   traMADol (ULTRAM) 50 MG tablet TAKE 1 TO 2 TABLETS BY MOUTH AT BEDTIME AS NEEDED 45 tablet 0   valsartan (DIOVAN) 160 MG tablet Take 1 tablet (160 mg total) by mouth  daily. 90 tablet 0   COVID-19 mRNA bivalent vaccine, Pfizer, (PFIZER COVID-19 VAC BIVALENT) injection Inject into the muscle. 0.3 mL 0   No facility-administered medications prior to visit.    No Known Allergies  ROS Review of Systems  Constitutional:  Negative for chills, diaphoresis, fatigue, fever and unexpected weight change.  HENT: Negative.    Eyes:  Negative for photophobia and visual disturbance.  Respiratory: Negative.    Cardiovascular: Negative.   Gastrointestinal: Negative.   Endocrine: Negative for polyphagia and polyuria.  Genitourinary:  Negative for difficulty urinating, frequency and urgency.  Musculoskeletal:  Negative for gait problem and joint swelling.  Skin:  Negative for pallor and rash.  Neurological:  Negative for speech difficulty, weakness and light-headedness.  Hematological:  Does not bruise/bleed easily.  Psychiatric/Behavioral: Negative.       Objective:    Physical Exam Vitals and nursing note reviewed.  Constitutional:      General: He is not in acute distress.    Appearance: Normal appearance. He is not ill-appearing, toxic-appearing or diaphoretic.  HENT:     Head: Normocephalic and atraumatic.     Right Ear: Tympanic membrane, ear canal and external ear normal.     Left Ear: Tympanic membrane, ear canal and external ear normal.     Mouth/Throat:     Mouth: Mucous membranes are moist.     Pharynx: Oropharynx is clear. No oropharyngeal exudate or posterior oropharyngeal erythema.  Eyes:     General: No scleral icterus.       Right eye: No discharge.        Left eye: No discharge.     Extraocular Movements: Extraocular movements intact.     Conjunctiva/sclera: Conjunctivae normal.     Pupils: Pupils are equal, round, and reactive to light.  Neck:     Vascular: No carotid bruit.  Cardiovascular:     Rate and Rhythm: Normal rate and regular rhythm.     Pulses:          Dorsalis pedis pulses are 2+ on the right side and 2+ on the left  side.       Posterior tibial pulses are 2+ on the right side and 2+ on the left side.  Pulmonary:     Effort: Pulmonary effort is normal.     Breath sounds: Normal breath sounds.  Abdominal:     General: Bowel sounds are normal. There is no distension.     Palpations: There is no mass.  Tenderness: There is no abdominal tenderness. There is no guarding or rebound.     Hernia: No hernia is present.  Genitourinary:    Prostate: Not enlarged, not tender and no nodules present.     Rectum: Guaiac result negative. No mass, tenderness, anal fissure, external hemorrhoid or internal hemorrhoid. Normal anal tone.  Musculoskeletal:     Cervical back: No rigidity or tenderness.     Right lower leg: No edema.     Left lower leg: No edema.  Lymphadenopathy:     Cervical: No cervical adenopathy.  Skin:    General: Skin is warm and dry.  Neurological:     Mental Status: He is alert and oriented to person, place, and time.  Psychiatric:        Mood and Affect: Mood normal.        Behavior: Behavior normal.   Diabetic Foot Exam - Simple   Simple Foot Form Diabetic Foot exam was performed with the following findings: Yes 12/25/2020 11:41 AM  Visual Inspection No deformities, no ulcerations, no other skin breakdown bilaterally: Yes Sensation Testing Intact to touch and monofilament testing bilaterally: Yes Pulse Check Posterior Tibialis and Dorsalis pulse intact bilaterally: Yes Comments     .diabet BP 122/68 (BP Location: Left Arm, Patient Position: Sitting, Cuff Size: Large)   Pulse 97   Temp 98 F (36.7 C) (Temporal)   Ht 5\' 11"  (1.803 m)   Wt 234 lb 9.6 oz (106.4 kg)   SpO2 98%   BMI 32.72 kg/m  Wt Readings from Last 3 Encounters:  12/25/20 234 lb 9.6 oz (106.4 kg)  06/09/20 240 lb (108.9 kg)  11/08/19 240 lb (108.9 kg)     Health Maintenance Due  Topic Date Due   Zoster Vaccines- Shingrix (1 of 2) Never done   COLONOSCOPY (Pts 45-28yrs Insurance coverage will need to  be confirmed)  12/10/2020   HEMOGLOBIN A1C  12/12/2020    There are no preventive care reminders to display for this patient.  Lab Results  Component Value Date   TSH 3.19 04/24/2019   Lab Results  Component Value Date   WBC 5.5 06/11/2020   HGB 11.6 (L) 06/11/2020   HCT 34.0 (L) 06/11/2020   MCV 96.7 06/11/2020   PLT 303.0 06/11/2020   Lab Results  Component Value Date   NA 139 06/11/2020   K 4.6 06/11/2020   CO2 28 06/11/2020   GLUCOSE 104 (H) 06/11/2020   BUN 23 06/11/2020   CREATININE 1.07 06/11/2020   BILITOT 0.5 06/11/2020   ALKPHOS 83 06/11/2020   AST 17 06/11/2020   ALT 16 06/11/2020   PROT 6.8 06/11/2020   ALBUMIN 4.1 06/11/2020   CALCIUM 9.1 06/11/2020   ANIONGAP 6 12/29/2016   GFR 70.40 06/11/2020   Lab Results  Component Value Date   CHOL 138 06/11/2020   Lab Results  Component Value Date   HDL 38.20 (L) 06/11/2020   Lab Results  Component Value Date   LDLCALC 88 06/11/2020   Lab Results  Component Value Date   TRIG 57.0 06/11/2020   Lab Results  Component Value Date   CHOLHDL 4 06/11/2020   Lab Results  Component Value Date   HGBA1C 6.3 06/11/2020      Assessment & Plan:   Problem List Items Addressed This Visit       Cardiovascular and Mediastinum   Essential hypertension   Relevant Orders   CBC   Comprehensive metabolic panel   Microalbumin /  creatinine urine ratio   Urinalysis, Routine w reflex microscopic     Endocrine   Type 2 diabetes mellitus without complication, without long-term current use of insulin (HCC)   Relevant Orders   Comprehensive metabolic panel   Microalbumin / creatinine urine ratio   Urinalysis, Routine w reflex microscopic     Other   Dyslipidemia   Relevant Orders   Comprehensive metabolic panel   Hemoglobin A1c   LDL cholesterol, direct   Lipid panel   Chronic pain of left knee   Healthcare maintenance   Relevant Orders   PSA   Ambulatory referral to Gastroenterology   Need for  influenza vaccination - Primary   Relevant Orders   Flu Vaccine QUAD High Dose(Fluad) (Completed)    No orders of the defined types were placed in this encounter.   Follow-up: Return in about 6 months (around 06/24/2021).   Information was given about health maintenance and disease prevention.  Agrees to go for follow-up colonoscopy.  He will continue exercise by walking as tolerated.  Continue Ultram at night.  He will return fasting for above ordered blood work. Libby Maw, MD

## 2020-12-30 ENCOUNTER — Telehealth: Payer: Self-pay | Admitting: Family Medicine

## 2020-12-30 DIAGNOSIS — J069 Acute upper respiratory infection, unspecified: Secondary | ICD-10-CM

## 2020-12-30 MED ORDER — BENZONATATE 200 MG PO CAPS: 200.0000 mg | ORAL_CAPSULE | Freq: Two times a day (BID) | ORAL | 0 refills | Status: DC | PRN

## 2020-12-30 NOTE — Telephone Encounter (Signed)
Patient aware Rx sent to pharmacy. °

## 2020-12-30 NOTE — Telephone Encounter (Signed)
Please advise message below patient would like to know if something could be sent in for cough.

## 2020-12-31 ENCOUNTER — Other Ambulatory Visit: Payer: PPO

## 2021-01-02 ENCOUNTER — Other Ambulatory Visit: Payer: Self-pay | Admitting: Family Medicine

## 2021-01-02 DIAGNOSIS — G8929 Other chronic pain: Secondary | ICD-10-CM

## 2021-01-02 NOTE — Telephone Encounter (Signed)
Please advice Rx below

## 2021-01-05 ENCOUNTER — Other Ambulatory Visit: Payer: Self-pay

## 2021-01-05 ENCOUNTER — Other Ambulatory Visit (INDEPENDENT_AMBULATORY_CARE_PROVIDER_SITE_OTHER): Payer: PPO

## 2021-01-05 ENCOUNTER — Other Ambulatory Visit: Payer: Self-pay | Admitting: Family Medicine

## 2021-01-05 DIAGNOSIS — Z125 Encounter for screening for malignant neoplasm of prostate: Secondary | ICD-10-CM | POA: Diagnosis not present

## 2021-01-05 DIAGNOSIS — I10 Essential (primary) hypertension: Secondary | ICD-10-CM | POA: Diagnosis not present

## 2021-01-05 DIAGNOSIS — E119 Type 2 diabetes mellitus without complications: Secondary | ICD-10-CM | POA: Diagnosis not present

## 2021-01-05 DIAGNOSIS — E785 Hyperlipidemia, unspecified: Secondary | ICD-10-CM | POA: Diagnosis not present

## 2021-01-05 DIAGNOSIS — Z0000 Encounter for general adult medical examination without abnormal findings: Secondary | ICD-10-CM

## 2021-01-05 LAB — URINALYSIS, ROUTINE W REFLEX MICROSCOPIC
Bilirubin Urine: NEGATIVE
Hgb urine dipstick: NEGATIVE
Ketones, ur: NEGATIVE
Leukocytes,Ua: NEGATIVE
Nitrite: NEGATIVE
RBC / HPF: NONE SEEN (ref 0–?)
Specific Gravity, Urine: 1.025 (ref 1.000–1.030)
Total Protein, Urine: NEGATIVE
Urine Glucose: NEGATIVE
Urobilinogen, UA: 0.2 (ref 0.0–1.0)
pH: 6 (ref 5.0–8.0)

## 2021-01-05 LAB — LIPID PANEL
Cholesterol: 132 mg/dL (ref 0–200)
HDL: 41.8 mg/dL (ref >39.00)
LDL Cholesterol: 80 mg/dL (ref 0–99)
NonHDL: 89.84
Total CHOL/HDL Ratio: 3
Triglycerides: 48 mg/dL (ref 0.0–149.0)
VLDL: 9.6 mg/dL (ref 0.0–40.0)

## 2021-01-05 LAB — COMPREHENSIVE METABOLIC PANEL
ALT: 12 U/L (ref 0–53)
AST: 14 U/L (ref 0–37)
Albumin: 4 g/dL (ref 3.5–5.2)
Alkaline Phosphatase: 85 U/L (ref 39–117)
BUN: 19 mg/dL (ref 6–23)
CO2: 26 mEq/L (ref 19–32)
Calcium: 9 mg/dL (ref 8.4–10.5)
Chloride: 107 mEq/L (ref 96–112)
Creatinine, Ser: 1.04 mg/dL (ref 0.40–1.50)
GFR: 72.55 mL/min (ref >60.00)
Glucose, Bld: 102 mg/dL — ABNORMAL HIGH (ref 70–99)
Potassium: 4.6 mEq/L (ref 3.5–5.1)
Sodium: 142 mEq/L (ref 135–145)
Total Bilirubin: 0.4 mg/dL (ref 0.2–1.2)
Total Protein: 6.7 g/dL (ref 6.0–8.3)

## 2021-01-05 LAB — CBC
HCT: 34.1 % — ABNORMAL LOW (ref 39.0–52.0)
Hemoglobin: 11.4 g/dL — ABNORMAL LOW (ref 13.0–17.0)
MCHC: 33.5 g/dL (ref 30.0–36.0)
MCV: 97.6 fl (ref 78.0–100.0)
Platelets: 335 10*3/uL (ref 150.0–400.0)
RBC: 3.5 Mil/uL — ABNORMAL LOW (ref 4.22–5.81)
RDW: 13.1 % (ref 11.5–15.5)
WBC: 7.3 10*3/uL (ref 4.0–10.5)

## 2021-01-05 LAB — HEMOGLOBIN A1C: Hgb A1c MFr Bld: 6.5 % (ref 4.6–6.5)

## 2021-01-05 LAB — MICROALBUMIN / CREATININE URINE RATIO
Creatinine,U: 114.6 mg/dL
Microalb Creat Ratio: 0.6 mg/g (ref 0.0–30.0)
Microalb, Ur: <0.7 mg/dL (ref 0.0–1.9)

## 2021-01-05 LAB — LDL CHOLESTEROL, DIRECT: Direct LDL: 80 mg/dL

## 2021-01-05 LAB — PSA: PSA: 0.76 ng/mL (ref 0.10–4.00)

## 2021-01-05 NOTE — Progress Notes (Signed)
Per the orders of Dr. Ethelene Hal pt is here for labs, pt tolerated draw well. Pt was able to provide an adequate amount of urine for sample requested.

## 2021-01-21 ENCOUNTER — Telehealth: Payer: Self-pay | Admitting: Family Medicine

## 2021-01-22 NOTE — Telephone Encounter (Signed)
Pt called and said he is out of medication and asked if it can be sent in today

## 2021-01-30 ENCOUNTER — Other Ambulatory Visit: Payer: Self-pay | Admitting: Family Medicine

## 2021-01-30 DIAGNOSIS — I10 Essential (primary) hypertension: Secondary | ICD-10-CM

## 2021-02-02 ENCOUNTER — Other Ambulatory Visit: Payer: Self-pay | Admitting: Family Medicine

## 2021-02-02 DIAGNOSIS — G8929 Other chronic pain: Secondary | ICD-10-CM

## 2021-02-11 ENCOUNTER — Other Ambulatory Visit: Payer: Self-pay | Admitting: Family Medicine

## 2021-02-11 DIAGNOSIS — J4520 Mild intermittent asthma, uncomplicated: Secondary | ICD-10-CM

## 2021-02-11 DIAGNOSIS — I10 Essential (primary) hypertension: Secondary | ICD-10-CM

## 2021-02-12 ENCOUNTER — Telehealth: Payer: Self-pay | Admitting: Family Medicine

## 2021-02-12 NOTE — Telephone Encounter (Signed)
Noted  

## 2021-02-12 NOTE — Telephone Encounter (Signed)
[id number] would like all of his diltiazem (CARDIZEM LA) 180 MG 24 hr tablet [[id number]] be in capsule form from now on. It is much cheaper for him, it's $160 compared to $24 for the capsule.

## 2021-02-19 ENCOUNTER — Ambulatory Visit (AMBULATORY_SURGERY_CENTER): Payer: Self-pay | Admitting: *Deleted

## 2021-02-19 VITALS — Ht 71.0 in | Wt 234.0 lb

## 2021-02-19 DIAGNOSIS — Z86010 Personal history of colonic polyps: Secondary | ICD-10-CM

## 2021-02-19 MED ORDER — PLENVU 140 G PO SOLR: 1.0000 | ORAL | 0 refills | Status: DC

## 2021-02-19 NOTE — Progress Notes (Signed)
No egg or soy allergy known to patient  No issues known to pt with past sedation with any surgeries or procedures Patient denies ever being told they had issues or difficulty with intubation  No FH of Malignant Hyperthermia Pt is not on diet pills Pt is not on  home 02  Pt is not on blood thinners  Pt denies issues with constipation  No A fib or A flutter  Pt is fully vaccinated  for Covid   Pl[employer] Medicare Coupon to pt in PV today , Code to Pharmacy and  NO PA's for preps discussed with pt In PV today  Discussed with pt there will be an out-of-pocket cost for prep and that varies from $0 to 70 +  dollars - pt verbalized understanding   Due to the COVID-19 pandemic we are asking patients to follow certain guidelines in PV and the [place]   Pt aware of COVID protocols and [facility] guidelines   PV completed over the phone. Pt verified name, DOB, address and insurance during PV today.  Pt mailed instruction packet with copy of consent form to read and not return, and instructions.  Pt encouraged to call with questions or issues.  If pt has My chart, procedure instructions sent via My Chart

## 2021-02-24 ENCOUNTER — Ambulatory Visit (INDEPENDENT_AMBULATORY_CARE_PROVIDER_SITE_OTHER): Payer: PPO

## 2021-02-24 DIAGNOSIS — Z0000 Encounter for general adult medical examination without abnormal findings: Secondary | ICD-10-CM

## 2021-02-24 NOTE — Progress Notes (Signed)
Subjective:   Edward Hawkins is a 71 y.o. male who presents for an Subsequent Medicare Annual Wellness Visit.   I connected with Edward Hawkins today by telephone and verified that I am speaking with the correct person using two identifiers. Location patient: home Location provider: work Persons participating in the virtual visit: patient, provider.   I discussed the limitations, risks, security and privacy concerns of performing an evaluation and management service by telephone and the availability of in person appointments. I also discussed with the patient that there may be a patient responsible charge related to this service. The patient expressed understanding and verbally consented to this telephonic visit.    Interactive audio and video telecommunications were attempted between this provider and patient, however failed, due to patient having technical difficulties OR patient did not have access to video capability.  We continued and completed visit with audio only.    Review of Systems     Cardiac Risk Factors include: advanced age (>7mn, >>105women);diabetes mellitus;dyslipidemia;male gender;hypertension     Objective:    Today's Vitals   There is no height or weight on file to calculate BMI.  Advanced Directives 02/24/2021 09/13/2019 11/01/2017 12/28/2016 12/20/2016 10/28/2016 10/03/2015  Does Patient Have a Medical Advance Directive? Yes Yes No Yes Yes No No  Type of [place] Living will - -  Does patient want to make changes to medical advance directive? - - - No - Patient declined - - -  Copy of [place] Chart? No - copy requested No - copy requested - - - - -  Would patient like information on creating a medical advance directive? - - - - - No - Patient declined -    Current Medications (verified) Outpatient Encounter Medications as of 02/24/2021   Medication Sig   aspirin 325 MG tablet Take 325 mg by mouth daily.   atorvastatin (LIPITOR) 20 MG tablet Take 1 tablet by mouth once daily   Cholecalciferol 50 MCG (2000 UT) CAPS Take 2,000 Units daily by mouth.   diltiazem (CARDIZEM LA) 180 MG 24 hr tablet Take 1 tablet by mouth once daily   fish oil-omega-3 fatty acids 1000 MG capsule Take 1 g by mouth daily.   fluticasone-salmeterol (ADVAIR) 250-50 MCG/ACT AEPB INHALE 1 DOSE BY MOUTH ONCE DAILY   metFORMIN (GLUCOPHAGE-XR) 500 MG 24 hr tablet TAKE 2 TABLETS BY MOUTH ONCE DAILY WITH BREAKFAST   montelukast (SINGULAIR) 10 MG tablet TAKE 1 TABLET BY MOUTH AT BEDTIME   Multiple Vitamin (MULTIVITAMIN) tablet Take 1 tablet by mouth daily.    PEG-KCl-NaCl-NaSulf-Na Asc-C (PLENVU) 140 g SOLR Take 1 kit by mouth as directed. Plenvu Medicare Coupon BIN [id number] CNRX Group [id number] NO PAs   pioglitazone (ACTOS) 15 MG tablet Take 1 tablet by mouth once daily   traMADol (ULTRAM) 50 MG tablet TAKE 1 TO 2 TABLETS BY MOUTH AT BEDTIME AS NEEDED   valsartan (DIOVAN) 160 MG tablet Take 1 tablet (160 mg total) by mouth daily.   No facility-administered encounter medications on file as of 02/24/2021.    Allergies (verified) Patient has no known allergies.   History: Past Medical History:  Diagnosis Date   Arthritis    ASTHMA 10/08/2006   Asthma    last flareup more than 10 yrs ago.   Cataract    extractions   Colon polyps    tubular adenomas  CVA 10/10/2005   DIABETES MELLITUS, TYPE II 10/08/2006   Diverticulosis    HYPERLIPIDEMIA 10/08/2006   HYPERTENSION 10/08/2006   Primary localized osteoarthritis of left knee 12/28/2016   RECTAL BLEEDING 09/15/2009   S/P LASIK surgery of both eyes    Seasonal allergies    Past Surgical History:  Procedure Laterality Date   CATARACT EXTRACTION, BILATERAL     COLONOSCOPY     EYE SURGERY     FRACTURE SURGERY     KNEE SURGERY     left knee x2   PARTIAL KNEE ARTHROPLASTY Left 12/28/2016    Procedure: LEFT UNICOMPARTMENTAL KNEE;  Surgeon: Marchia Bond, MD;  Location: [facility];  Service: Orthopedics;  Laterality: Left;   RADIOLOGY WITH ANESTHESIA N/A 10/28/2016   Procedure: MRI - CERVICAL SPINE WITHOUT;  Surgeon: Radiologist, Medication, MD;  Location: [facility];  Service: Radiology;  Laterality: N/A;   Stress Cardiolite  11/05/2005   TRANSTHORACIC ECHOCARDIOGRAM  10/12/2005   Family History  Problem Relation Age of Onset   Diabetes Mother    Diabetes Father    Cancer Brother        Lung Cancer   Cancer Daughter        Breast Cancer   Colon cancer Neg Hx    Colon polyps Neg Hx    Esophageal cancer Neg Hx    Rectal cancer Neg Hx    Stomach cancer Neg Hx    Social History   Socioeconomic History   Marital status: Single    Spouse name: Not on file   Number of children: Not on file   Years of education: Not on file   Highest education level: Not on file  Occupational History   Occupation: Works at [employer] Use   Smoking status: Never   Smokeless tobacco: Never  Vaping Use   Vaping Use: Never used  Substance and Sexual Activity   Alcohol use: No   Drug use: No   Sexual activity: Not on file  Other Topics Concern   Not on file  Social History Narrative   Not on file   Social Determinants of Health   Financial Resource Strain: Low Risk    Difficulty of Paying Living Expenses: Not hard at all  Food Insecurity: No Food Insecurity   Worried About Charity fundraiser in the Last Year: Never true   [place] in the Last Year: Never true  Transportation Needs: No Transportation Needs   Lack of Transportation (Medical): No   Lack of Transportation (Non-Medical): No  Physical Activity: Sufficiently Active   Days of Exercise per Week: 3 days   Minutes of Exercise per Session: 60 min  Stress: No Stress Concern Present   Feeling of Stress : Not at all  Social Connections: Moderately Isolated   Frequency of Communication with Friends and Family: Twice a  week   Frequency of Social Gatherings with Friends and Family: Twice a week   Attends Religious Services: Never   Marine scientist or Organizations: No   Attends Music therapist: Never   Marital Status: Living with partner    Tobacco Counseling Counseling given: Not Answered   Clinical Intake:  Pre-visit preparation completed: Yes  Pain : No/denies pain     Nutritional Risks: None Diabetes: Yes CBG done?: No Did pt. bring in CBG monitor from home?: No  How often do you need to have someone help you when you read instructions, pamphlets, or other written  materials from your doctor or pharmacy?: 1 - Never What is the last grade level you completed in school?: 11th  Diabetic?yes Nutrition Risk Assessment:  Has the patient had any N/V/D within the last 2 months?  No  Does the patient have any non-healing wounds?  No  Has the patient had any unintentional weight loss or weight gain?  No   Diabetes:  Is the patient diabetic?  Yes  If diabetic, was a CBG obtained today?  No  Did the patient bring in their glucometer from home?  No  How often do you monitor your CBG's? Never .   Financial Strains and Diabetes Management:  Are you having any financial strains with the device, your supplies or your medication? No .  Does the patient want to be seen by Chronic Care Management for management of their diabetes?  No  Would the patient like to be referred to a Nutritionist or for Diabetic Management?  No   Diabetic Exams:  Diabetic Eye Exam: Completed 09/2020 Diabetic Foot Exam: Overdue, Pt has been advised about the importance in completing this exam. Pt is scheduled for diabetic foot exam on next office visit .   Interpreter Needed?: No  Information entered by :: [place] of Daily Living In your present state of health, do you have any difficulty performing the following activities: 02/24/2021  Hearing? N  Vision? N  Difficulty  concentrating or making decisions? N  Walking or climbing stairs? N  Dressing or bathing? N  Doing errands, shopping? N  Preparing Food and eating ? N  Using the Toilet? N  In the past six months, have you accidently leaked urine? N  Do you have problems with loss of bowel control? N  Managing your Medications? N  Managing your Finances? N  Housekeeping or managing your Housekeeping? N  Some recent data might be hidden    Patient Care Team: Libby Maw, MD as PCP - General (Family Medicine) Avie Echevaria., MD as Referring Physician (Sports Medicine) Rutherford Guys, MD as Attending Physician (Ophthalmology) Darius Bump, [facility] as Pharmacist (Pharmacist)  Indicate any recent Medical Services you may have received from other than [facility] providers in the past year (date may be approximate).     Assessment:   This is a routine wellness examination for [place].  Hearing/Vision screen Vision Screening - Comments:: Annual eye exams wear glasses   Dietary issues and exercise activities discussed: Current Exercise Habits: Home exercise routine, Type of exercise: walking, Time (Minutes): 45, Frequency (Times/Week): 3, Weekly Exercise (Minutes/Week): 135, Intensity: Mild, Exercise limited by: None identified   Goals Addressed             This Visit's Progress    Patient Stated   On track    Drink more water & continue walking       Depression Screen PHQ 2/9 Scores 02/24/2021 02/24/2021 12/25/2020 06/09/2020 11/08/2019 11/08/2019 09/13/2019  PHQ - 2 Score 0 0 0 0 0 0 0  PHQ- 9 Score - - - - 0 - -    Fall Risk Fall Risk  02/24/2021 12/25/2020 06/09/2020 11/08/2019 09/13/2019  Falls in the past year? 0 0 0 0 0  Number falls in past yr: 0 0 - - 0  Injury with Fall? 0 - - - 0  Follow up Falls evaluation completed - - - Falls prevention discussed    FALL RISK PREVENTION PERTAINING TO THE HOME:  Any stairs in or around the home?  Yes  If so, are there any without handrails?  No  Home free of loose throw rugs in walkways, pet beds, electrical cords, etc? Yes  Adequate lighting in your home to reduce risk of falls? Yes   ASSISTIVE DEVICES UTILIZED TO PREVENT FALLS:  Life alert? No  Use of a cane, walker or w/c? No  Grab bars in the bathroom? No  Shower chair or bench in shower? No  Elevated toilet seat or a handicapped toilet? No   Cognitive Function:  Normal cognitive status assessed by direct observation by this Nurse Health Advisor. No abnormalities found.        Immunizations Immunization History  Administered Date(s) Administered   Fluad Quad(high Dose 65+) 10/09/2018, 11/08/2019, 12/25/2020   Influenza, High Dose Seasonal PF 11/01/2016, 11/01/2017   PFIZER(Purple Top)SARS-COV-2 Vaccination 02/27/2019, 03/18/2019, 11/24/2019   Pfizer Covid-19 Vaccine Bivalent Booster 15yr & up 11/24/2020   Pneumococcal Conjugate-13 11/06/2018   Pneumococcal Polysaccharide-23 09/28/2010, 10/03/2015   Td 11/09/2000   Tdap 10/01/2013    TDAP status: Up to date  Flu Vaccine status: Up to date  Pneumococcal vaccine status: Up to date  Covid-19 vaccine status: Completed vaccines  Qualifies for Shingles Vaccine? Yes   Zostavax completed No   Shingrix Completed?: No.    Education has been provided regarding the importance of this vaccine. Patient has been advised to call insurance company to determine out of pocket expense if they have not yet received this vaccine. Advised may also receive vaccine at local pharmacy or Health Dept. Verbalized acceptance and understanding.  Screening Tests Health Maintenance  Topic Date Due   Zoster Vaccines- Shingrix (1 of 2) Never done   COLONOSCOPY (Pts 45-432yrInsurance coverage will need to be confirmed)  12/10/2020   HEMOGLOBIN A1C  07/05/2021   OPHTHALMOLOGY EXAM  09/15/2021   FOOT EXAM  12/25/2021   TETANUS/TDAP  10/02/2023   Pneumonia Vaccine 6557Years old  Completed   INFLUENZA VACCINE  Completed   COVID-19  Vaccine  Completed   Hepatitis C Screening  Completed   HPV VACCINES  Aged Out    Health Maintenance  Health Maintenance Due  Topic Date Due   Zoster Vaccines- Shingrix (1 of 2) Never done   COLONOSCOPY (Pts 45-492yrnsurance coverage will need to be confirmed)  12/10/2020    Colorectal cancer screening: Referral to GI placed schedule 03/05/2021. Pt aware the office will call re: appt.  Lung Cancer Screening: (Low Dose CT Chest recommended if Age 57-59-80ars, 30 pack-year currently smoking OR have quit w/in 15years.) does not qualify.   Lung Cancer Screening Referral: n/a  Additional Screening:  Hepatitis C Screening: does not qualify; Completed 10/03/2015  Vision Screening: Recommended annual ophthalmology exams for early detection of glaucoma and other disorders of the eye. Is the patient up to date with their annual eye exam?  Yes  Who is the provider or what is the name of the office in which the patient attends annual eye exams? Dr.Shipiro If pt is not established with a provider, would they like to be referred to a provider to establish care? No .   Dental Screening: Recommended annual dental exams for proper oral hygiene  Community Resource Referral / Chronic Care Management: CRR required this visit?  No   CCM required this visit?  No      Plan:     I have personally reviewed and noted the following in the patients chart:   Medical and social history Use of alcohol,  tobacco or illicit drugs  Current medications and supplements including opioid prescriptions. Patient is currently taking opioid prescriptions. Information provided to patient regarding non-opioid alternatives. Patient advised to discuss non-opioid treatment plan with their provider. Functional ability and status Nutritional status Physical activity Advanced directives List of other physicians Hospitalizations, surgeries, and ER visits in previous 12 months Vitals Screenings to include  cognitive, depression, and falls Referrals and appointments  In addition, I have reviewed and discussed with patient certain preventive protocols, quality metrics, and best practice recommendations. A written personalized care plan for preventive services as well as general preventive health recommendations were provided to patient.     Randel Pigg, LPN   [id number]   Nurse Notes: none

## 2021-02-24 NOTE — Patient Instructions (Signed)
Edward Hawkins , Thank you for taking time to come for your Medicare Wellness Visit. I appreciate your ongoing commitment to your health goals. Please review the following plan we discussed and let me know if I can assist you in the future.   Screening recommendations/referrals: Colonoscopy: scheduled 03/05/2021 Recommended yearly ophthalmology/optometry visit for glaucoma screening and checkup Recommended yearly dental visit for hygiene and checkup  Vaccinations: Influenza vaccine: completed  Pneumococcal vaccine: completed  Tdap vaccine: 10/01/2013 Shingles vaccine: will consider     Advanced directives: yes   Conditions/risks identified: none   Next appointment: none   Preventive Care 71 Years and Older, Male Preventive care refers to lifestyle choices and visits with your health care provider that can promote health and wellness. What does preventive care include? A yearly physical exam. This is also called an annual well check. Dental exams once or twice a year. Routine eye exams. Ask your health care provider how often you should have your eyes checked. Personal lifestyle choices, including: Daily care of your teeth and gums. Regular physical activity. Eating a healthy diet. Avoiding tobacco and drug use. Limiting alcohol use. Practicing safe sex. Taking low doses of aspirin every day. Taking vitamin and mineral supplements as recommended by your health care provider. What happens during an annual well check? The services and screenings done by your health care provider during your annual well check will depend on your age, overall health, lifestyle risk factors, and family history of disease. Counseling  Your health care provider may ask you questions about your: Alcohol use. Tobacco use. Drug use. Emotional well-being. Home and relationship well-being. Sexual activity. Eating habits. History of falls. Memory and ability to understand (cognition). Work and work  Statistician. Screening  You may have the following tests or measurements: Height, weight, and BMI. Blood pressure. Lipid and cholesterol levels. These may be checked every 5 years, or more frequently if you are over 24 years old. Skin check. Lung cancer screening. You may have this screening every year starting at age 48 if you have a 30-pack-year history of smoking and currently smoke or have quit within the past 15 years. Fecal occult blood test (FOBT) of the stool. You may have this test every year starting at age 34. Flexible sigmoidoscopy or colonoscopy. You may have a sigmoidoscopy every 5 years or a colonoscopy every 10 years starting at age 91. Prostate cancer screening. Recommendations will vary depending on your family history and other risks. Hepatitis C blood test. Hepatitis B blood test. Sexually transmitted disease (STD) testing. Diabetes screening. This is done by checking your blood sugar (glucose) after you have not eaten for a while (fasting). You may have this done every 1-3 years. Abdominal aortic aneurysm (AAA) screening. You may need this if you are a current or former smoker. Osteoporosis. You may be screened starting at age 72 if you are at high risk. Talk with your health care provider about your test results, treatment options, and if necessary, the need for more tests. Vaccines  Your health care provider may recommend certain vaccines, such as: Influenza vaccine. This is recommended every year. Tetanus, diphtheria, and acellular pertussis (Tdap, Td) vaccine. You may need a Td booster every 10 years. Zoster vaccine. You may need this after age 82. Pneumococcal 13-valent conjugate (PCV13) vaccine. One dose is recommended after age 65. Pneumococcal polysaccharide (PPSV23) vaccine. One dose is recommended after age 56. Talk to your health care provider about which screenings and vaccines you need and how often  you need them. This information is not intended to replace  advice given to you by your health care provider. Make sure you discuss any questions you have with your health care provider. Document Released: 02/21/2015 Document Revised: 10/15/2015 Document Reviewed: 11/26/2014 Elsevier Interactive Patient Education  2017 [place] Prevention in the Home Falls can cause injuries. They can happen to people of all ages. There are many things you can do to make your home safe and to help prevent falls. What can I do on the outside of my home? Regularly fix the edges of walkways and driveways and fix any cracks. Remove anything that might make you trip as you walk through a door, such as a raised step or threshold. Trim any bushes or trees on the path to your home. Use bright outdoor lighting. Clear any walking paths of anything that might make someone trip, such as rocks or tools. Regularly check to see if handrails are loose or broken. Make sure that both sides of any steps have handrails. Any raised decks and porches should have guardrails on the edges. Have any leaves, snow, or ice cleared regularly. Use sand or salt on walking paths during winter. Clean up any spills in your garage right away. This includes oil or grease spills. What can I do in the bathroom? Use night lights. Install grab bars by the toilet and in the tub and shower. Do not use towel bars as grab bars. Use non-skid mats or decals in the tub or shower. If you need to sit down in the shower, use a plastic, non-slip stool. Keep the floor dry. Clean up any water that spills on the floor as soon as it happens. Remove soap buildup in the tub or shower regularly. Attach bath mats securely with double-sided non-slip rug tape. Do not have throw rugs and other things on the floor that can make you trip. What can I do in the bedroom? Use night lights. Make sure that you have a light by your bed that is easy to reach. Do not use any sheets or blankets that are too big for your bed.  They should not hang down onto the floor. Have a firm chair that has side arms. You can use this for support while you get dressed. Do not have throw rugs and other things on the floor that can make you trip. What can I do in the kitchen? Clean up any spills right away. Avoid walking on wet floors. Keep items that you use a lot in easy-to-reach places. If you need to reach something above you, use a strong step stool that has a grab bar. Keep electrical cords out of the way. Do not use floor polish or wax that makes floors slippery. If you must use wax, use non-skid floor wax. Do not have throw rugs and other things on the floor that can make you trip. What can I do with my stairs? Do not leave any items on the stairs. Make sure that there are handrails on both sides of the stairs and use them. Fix handrails that are broken or loose. Make sure that handrails are as long as the stairways. Check any carpeting to make sure that it is firmly attached to the stairs. Fix any carpet that is loose or worn. Avoid having throw rugs at the top or bottom of the stairs. If you do have throw rugs, attach them to the floor with carpet tape. Make sure that you have a light  switch at the top of the stairs and the bottom of the stairs. If you do not have them, ask someone to add them for you. What else can I do to help prevent falls? Wear shoes that: Do not have high heels. Have rubber bottoms. Are comfortable and fit you well. Are closed at the toe. Do not wear sandals. If you use a stepladder: Make sure that it is fully opened. Do not climb a closed stepladder. Make sure that both sides of the stepladder are locked into place. Ask someone to hold it for you, if possible. Clearly mark and make sure that you can see: Any grab bars or handrails. First and last steps. Where the edge of each step is. Use tools that help you move around (mobility aids) if they are needed. These  include: Canes. Walkers. Scooters. Crutches. Turn on the lights when you go into a dark area. Replace any light bulbs as soon as they burn out. Set up your furniture so you have a clear path. Avoid moving your furniture around. If any of your floors are uneven, fix them. If there are any pets around you, be aware of where they are. Review your medicines with your doctor. Some medicines can make you feel dizzy. This can increase your chance of falling. Ask your doctor what other things that you can do to help prevent falls. This information is not intended to replace advice given to you by your health care provider. Make sure you discuss any questions you have with your health care provider. Document Released: 11/21/2008 Document Revised: 07/03/2015 Document Reviewed: 03/01/2014 Elsevier Interactive Patient Education  2017 [employer].

## 2021-03-05 ENCOUNTER — Ambulatory Visit (AMBULATORY_SURGERY_CENTER): Payer: PPO | Admitting: Internal Medicine

## 2021-03-05 ENCOUNTER — Encounter: Payer: Self-pay | Admitting: Internal Medicine

## 2021-03-05 ENCOUNTER — Other Ambulatory Visit: Payer: Self-pay

## 2021-03-05 VITALS — BP 126/66 | HR 63 | Temp 95.7°F | Resp 14 | Ht 71.0 in | Wt 234.0 lb

## 2021-03-05 DIAGNOSIS — I10 Essential (primary) hypertension: Secondary | ICD-10-CM | POA: Diagnosis not present

## 2021-03-05 DIAGNOSIS — Z86010 Personal history of colonic polyps: Secondary | ICD-10-CM | POA: Diagnosis not present

## 2021-03-05 DIAGNOSIS — D120 Benign neoplasm of cecum: Secondary | ICD-10-CM | POA: Diagnosis not present

## 2021-03-05 DIAGNOSIS — D122 Benign neoplasm of ascending colon: Secondary | ICD-10-CM | POA: Diagnosis not present

## 2021-03-05 DIAGNOSIS — D124 Benign neoplasm of descending colon: Secondary | ICD-10-CM

## 2021-03-05 DIAGNOSIS — E119 Type 2 diabetes mellitus without complications: Secondary | ICD-10-CM | POA: Diagnosis not present

## 2021-03-05 MED ORDER — SODIUM CHLORIDE 0.9 % IV SOLN: 500.0000 mL | Freq: Once | INTRAVENOUS | Status: DC

## 2021-03-05 NOTE — Patient Instructions (Signed)
Handouts given for polyps and diverticulosis.  YOU HAD AN ENDOSCOPIC PROCEDURE TODAY AT THE [facility]:   Refer to the procedure report that was given to you for any specific questions about what was found during the examination.  If the procedure report does not answer your questions, please call your gastroenterologist to clarify.  If you requested that your care partner not be given the details of your procedure findings, then the procedure report has been included in a sealed envelope for you to review at your convenience later.  YOU SHOULD EXPECT: Some feelings of bloating in the abdomen. Passage of more gas than usual.  Walking can help get rid of the air that was put into your GI tract during the procedure and reduce the bloating. If you had a lower endoscopy (such as a colonoscopy or flexible sigmoidoscopy) you may notice spotting of blood in your stool or on the toilet paper. If you underwent a bowel prep for your procedure, you may not have a normal bowel movement for a few days.  Please Note:  You might notice some irritation and congestion in your nose or some drainage.  This is from the oxygen used during your procedure.  There is no need for concern and it should clear up in a day or so.  SYMPTOMS TO REPORT IMMEDIATELY:  Following lower endoscopy (colonoscopy or flexible sigmoidoscopy):  Excessive amounts of blood in the stool  Significant tenderness or worsening of abdominal pains  Swelling of the abdomen that is new, acute  Fever of 100F or higher  For urgent or emergent issues, a gastroenterologist can be reached at any hour by calling [phone]. Do not use MyChart messaging for urgent concerns.    DIET:  We do recommend a small meal at first, but then you may proceed to your regular diet.  Drink plenty of fluids but you should avoid alcoholic beverages for 24 hours.  ACTIVITY:  You should plan to take it easy for the rest of today and you should NOT DRIVE  or use heavy machinery until tomorrow (because of the sedation medicines used during the test).    FOLLOW UP: Our staff will call the number listed on your records 48-72 hours following your procedure to check on you and address any questions or concerns that you may have regarding the information given to you following your procedure. If we do not reach you, we will leave a message.  We will attempt to reach you two times.  During this call, we will ask if you have developed any symptoms of COVID 19. If you develop any symptoms (ie: fever, flu-like symptoms, shortness of breath, cough etc.) before then, please call [phone].  If you test positive for Covid 19 in the 2 weeks post procedure, please call and report this information to us.    If any biopsies were taken you will be contacted by phone or by letter within the next 1-3 weeks.  Please call us at [phone] if you have not heard about the biopsies in 3 weeks.    SIGNATURES/CONFIDENTIALITY: You and/or your care partner have signed paperwork which will be entered into your electronic medical record.  These signatures attest to the fact that that the information above on your After Visit Summary has been reviewed and is understood.  Full responsibility of the confidentiality of this discharge information lies with you and/or your care-partner.  

## 2021-03-05 NOTE — Progress Notes (Signed)
To Pacu, VSS. Report to [email or web address] 

## 2021-03-05 NOTE — Progress Notes (Signed)
Called to room to assist during endoscopic procedure.  Patient ID and intended procedure confirmed with present staff. Received instructions for my participation in the procedure from the performing physician.  

## 2021-03-05 NOTE — Progress Notes (Signed)
HISTORY OF PRESENT ILLNESS:  Edward Hawkins is a 71 y.o. male with a history of multiple adenomatous colon polyps.  Previous examinations 2009, 2013, 2017.  He presents today for surveillance colonoscopy.  No active complaints.  REVIEW OF SYSTEMS:  All non-GI ROS negative.  Past Medical History:  Diagnosis Date   Arthritis    ASTHMA 10/08/2006   Asthma    last flareup more than 10 yrs ago.   Cataract    extractions   Colon polyps    tubular adenomas   CVA 10/10/2005   DIABETES MELLITUS, TYPE II 10/08/2006   Diverticulosis    HYPERLIPIDEMIA 10/08/2006   HYPERTENSION 10/08/2006   Primary localized osteoarthritis of left knee 12/28/2016   RECTAL BLEEDING 09/15/2009   S/P LASIK surgery of both eyes    Seasonal allergies     Past Surgical History:  Procedure Laterality Date   CATARACT EXTRACTION, BILATERAL     COLONOSCOPY     EYE SURGERY     FRACTURE SURGERY     KNEE SURGERY     left knee x2   PARTIAL KNEE ARTHROPLASTY Left 12/28/2016   Procedure: LEFT UNICOMPARTMENTAL KNEE;  Surgeon: Marchia Bond, MD;  Location: [facility];  Service: Orthopedics;  Laterality: Left;   RADIOLOGY WITH ANESTHESIA N/A 10/28/2016   Procedure: MRI - CERVICAL SPINE WITHOUT;  Surgeon: Radiologist, Medication, MD;  Location: [facility];  Service: Radiology;  Laterality: N/A;   Stress Cardiolite  11/05/2005   TRANSTHORACIC ECHOCARDIOGRAM  10/12/2005    Social History Edward Hawkins  reports that he has never smoked. He has never used smokeless tobacco. He reports that he does not drink alcohol and does not use drugs.  family history includes Cancer in his brother and daughter; Diabetes in his father and mother.  No Known Allergies     PHYSICAL EXAMINATION:  Vital signs: BP (!) 120/51    Pulse 90    Temp (!) 95.7 F (35.4 C)    Ht 5\' 11"  (1.803 m)    Wt 234 lb (106.1 kg)    SpO2 98%    BMI 32.64 kg/m  General: Well-developed, well-nourished, no acute distress HEENT: Sclerae are anicteric,  conjunctiva pink. Oral mucosa intact Lungs: Clear Heart: Regular Abdomen: soft, nontender, nondistended, no obvious ascites, no peritoneal signs, normal bowel sounds. No organomegaly. Extremities: No edema Psychiatric: alert and oriented x3. Cooperative     ASSESSMENT:  1.  History multiple adenomatous colon polyps.  Due for surveillance   PLAN:   1.  Surveillance colonoscopy

## 2021-03-05 NOTE — Op Note (Signed)
[facility] Patient Name: Edward Hawkins Procedure Date: 03/05/2021 10:24 AM MRN: [id number] Endoscopist: Docia Chuck. Henrene Pastor , MD Age: 71 Referring MD:  Date of Birth: December 26, 1950 Gender: Male Account #: [id number] Procedure:                Colonoscopy with cold snare polypectomy x 3 Indications:              High risk colon cancer surveillance: Personal                            history of multiple (3 or more) adenomas. Previous                            examinations 2009, 2013, 2017 Medicines:                Monitored Anesthesia Care Procedure:                Pre-Anesthesia Assessment:                           - Prior to the procedure, a History and Physical                            was performed, and patient medications and                            allergies were reviewed. The patient's tolerance of                            previous anesthesia was also reviewed. The risks                            and benefits of the procedure and the sedation                            options and risks were discussed with the patient.                            All questions were answered, and informed consent                            was obtained. Prior Anticoagulants: The patient has                            taken no previous anticoagulant or antiplatelet                            agents. ASA Grade Assessment: II - A patient with                            mild systemic disease. After reviewing the risks                            and benefits, the patient was deemed in  satisfactory condition to undergo the procedure.                           After obtaining informed consent, the colonoscope                            was passed under direct vision. Throughout the                            procedure, the patient's blood pressure, pulse, and                            oxygen saturations were monitored continuously. The                             Olympus Colonoscope #[id number] was introduced through                            the anus and advanced to the the cecum, identified                            by appendiceal orifice and ileocecal valve. The                            ileocecal valve, appendiceal orifice, and rectum                            were photographed. The quality of the bowel                            preparation was excellent. The colonoscopy was                            performed without difficulty. The patient tolerated                            the procedure well. The bowel preparation used was                            SUPREP via split dose instruction. Scope In: 10:41:09 AM Scope Out: 10:58:13 AM Scope Withdrawal Time: 0 hours 15 minutes 28 seconds  Total Procedure Duration: 0 hours 17 minutes 4 seconds  Findings:                 Three polyps were found in the descending colon,                            ascending colon and cecum. The polyps were 2 to 5                            mm in size. These polyps were removed with a cold                            snare. Resection and  retrieval were complete.                           Multiple diverticula were found in the sigmoid                            colon.                           Internal hemorrhoids were found during retroflexion.                           The exam was otherwise without abnormality on                            direct and retroflexion views. Complications:            No immediate complications. Estimated blood loss:                            None. Estimated Blood Loss:     Estimated blood loss: none. Impression:               - Three 2 to 5 mm polyps in the descending colon,                            in the ascending colon and in the cecum, removed                            with a cold snare. Resected and retrieved.                           - Diverticulosis in the sigmoid colon.                           - Internal hemorrhoids.                            - The examination was otherwise normal on direct                            and retroflexion views. Recommendation:           - Repeat colonoscopy in 5 years for surveillance.                           - Patient has a contact number available for                            emergencies. The signs and symptoms of potential                            delayed complications were discussed with the                            patient. Return to normal activities tomorrow.  Written discharge instructions were provided to the                            patient.                           - Resume previous diet.                           - Continue present medications.                           - Await pathology results. Docia Chuck. Henrene Pastor, MD 03/05/2021 11:07:29 AM This report has been signed electronically.

## 2021-03-09 ENCOUNTER — Telehealth: Payer: Self-pay

## 2021-03-09 ENCOUNTER — Encounter: Payer: Self-pay | Admitting: Internal Medicine

## 2021-03-09 NOTE — Telephone Encounter (Signed)
°  Follow up Call-  Call back number 03/05/2021  Post procedure Call Back phone  # [phone]  Permission to leave phone message Yes  Some recent data might be hidden     Patient questions:  Do you have a fever, pain , or abdominal swelling? No. Pain Score  0 *  Have you tolerated food without any problems? Yes.    Have you been able to return to your normal activities? Yes.    Do you have any questions about your discharge instructions: Diet   No. Medications  No. Follow up visit  No.  Do you have questions or concerns about your Care? No.  Actions: * If pain score is 4 or above: No action needed, pain <4.

## 2021-03-10 ENCOUNTER — Other Ambulatory Visit: Payer: Self-pay | Admitting: Family Medicine

## 2021-04-06 ENCOUNTER — Other Ambulatory Visit: Payer: Self-pay | Admitting: Family Medicine

## 2021-04-07 ENCOUNTER — Other Ambulatory Visit: Payer: Self-pay | Admitting: Family Medicine

## 2021-04-07 DIAGNOSIS — G8929 Other chronic pain: Secondary | ICD-10-CM

## 2021-04-07 DIAGNOSIS — M25562 Pain in left knee: Secondary | ICD-10-CM

## 2021-04-27 ENCOUNTER — Other Ambulatory Visit: Payer: Self-pay | Admitting: Family Medicine

## 2021-04-27 DIAGNOSIS — I10 Essential (primary) hypertension: Secondary | ICD-10-CM

## 2021-04-27 DIAGNOSIS — J4520 Mild intermittent asthma, uncomplicated: Secondary | ICD-10-CM

## 2021-05-04 ENCOUNTER — Other Ambulatory Visit: Payer: Self-pay | Admitting: Family Medicine

## 2021-05-04 DIAGNOSIS — G8929 Other chronic pain: Secondary | ICD-10-CM

## 2021-05-13 ENCOUNTER — Other Ambulatory Visit: Payer: Self-pay | Admitting: Family Medicine

## 2021-05-13 ENCOUNTER — Telehealth: Payer: Self-pay | Admitting: Family Medicine

## 2021-05-13 DIAGNOSIS — I10 Essential (primary) hypertension: Secondary | ICD-10-CM

## 2021-05-13 MED ORDER — DILTIAZEM HCL ER COATED BEADS 180 MG PO TB24: 180.0000 mg | ORAL_TABLET | Freq: Every day | ORAL | 0 refills | Status: DC

## 2021-05-13 NOTE — Telephone Encounter (Signed)
Caller Name: Edward Hawkins ?Call back phone #: [phone] ? ?MEDICATION(S): diltiazem (CARDIZEM LA) 180 MG 24 hr tablet [[id number]] ? ? ?Days of Med Remaining:  ? ?Has the patient contacted their pharmacy (YES/NO)?  Yes, he is needing the capsule form and not the tablet, the tablet is now very expensive.  ?IF YES, when and what did the pharmacy advise? Contact your pharmacy ?IF NO, request that the patient contact the pharmacy for the refills in the future.  ?           The pharmacy will send an electronic request (except for controlled medications). ? ?Preferred Pharmacy: [facility], [address]  ?[address] [place] [address]  ?Phone:  [phone]  Fax:  [phone]  ? ?~~~Please advise patient/caregiver to allow 2-3 business days to process RX refills. ? ?

## 2021-05-18 ENCOUNTER — Encounter: Payer: Self-pay | Admitting: Nurse Practitioner

## 2021-05-18 ENCOUNTER — Telehealth (INDEPENDENT_AMBULATORY_CARE_PROVIDER_SITE_OTHER): Payer: PPO | Admitting: Nurse Practitioner

## 2021-05-18 DIAGNOSIS — J0110 Acute frontal sinusitis, unspecified: Secondary | ICD-10-CM | POA: Diagnosis not present

## 2021-05-18 MED ORDER — AMOXICILLIN-POT CLAVULANATE 875-125 MG PO TABS: 1.0000 | ORAL_TABLET | Freq: Two times a day (BID) | ORAL | 0 refills | Status: DC

## 2021-05-18 NOTE — Progress Notes (Signed)
?[facility] ?[address] ?[place] [address] ?Dept: [phone] ?Dept Fax: [phone] ? ?Virtual Video Visit ? ?I connected with Randell Loop on 05/18/21 at  3:40 PM EDT by a video enabled telemedicine application and verified that I am speaking with the correct person using two identifiers. ? ?Location patient: Home ?Location provider: Clinic ?Persons participating in the virtual visit: Patient, Provider ? ?I discussed the limitations of evaluation and management by telemedicine and the availability of in person appointments. The patient expressed understanding and agreed to proceed. ? ?Chief Complaint  ?Patient presents with  ? Sinusitis  ?  Nasal congestion, headache, and bad taste in mouth for over 1 week  ? ? ?SUBJECTIVE: ? ?HPI: Edward Hawkins is a 71 y.o. male who presents with headache, sinus pressure, terrible taste in his mouth, and congestion for over a week. Covid-19 test at work was negative.  ? ?UPPER RESPIRATORY TRACT INFECTION ? ?Fever: no ?Cough: no ?Shortness of breath: no ?Wheezing: no ?Chest pain: no ?Chest tightness: no ?Chest congestion: no ?Nasal congestion: yes ?Runny nose: yes ?Post nasal drip: yes ?Sneezing: no ?Sore throat: no ?Swollen glands: no ?Sinus pressure: yes ?Headache: yes ?Face pain: no ?Toothache: no ?Ear pain: no bilateral ?Ear pressure: no bilateral ?Eyes red/itching:no ?Eye drainage/crusting: no  ?Vomiting: no ?Rash: no ?Fatigue: yes ?Sick contacts: no ?Strep contacts: no  ?Context: stable ?Recurrent sinusitis: no ?Relief with OTC cold/cough medications: no  ?Treatments attempted: zyrtec, ibuprofen ? ? ?Patient Active Problem List  ? Diagnosis Date Noted  ? Need for influenza vaccination 11/08/2019  ? Abnormal TSH 04/04/2019  ? Muscle spasm 10/09/2018  ? URI (upper respiratory infection) 09/21/2018  ? Primary localized osteoarthritis of left knee 12/28/2016  ? S/P knee replacement 12/28/2016  ? Back  pain 12/11/2016  ? Macrocytic anemia 11/01/2016  ? Hypocalcemia 11/01/2016  ? Fever 03/31/2016  ? Preop examination 05/17/2014  ? Healthcare maintenance 09/29/2012  ? Routine general medical examination at a health care facility 09/28/2010  ? Chronic pain of left knee 09/28/2010  ? Cerebral artery occlusion with cerebral infarction ([facility]) 11/11/2009  ? RECTAL BLEEDING 09/15/2009  ? Type 2 diabetes mellitus without complication, without long-term current use of insulin ([facility]) 10/08/2006  ? Dyslipidemia 10/08/2006  ? Essential hypertension 10/08/2006  ? Asthma 10/08/2006  ? ? ?Past Surgical History:  ?Procedure Laterality Date  ? CATARACT EXTRACTION, BILATERAL    ? COLONOSCOPY    ? EYE SURGERY    ? FRACTURE SURGERY    ? KNEE SURGERY    ? left knee x2  ? PARTIAL KNEE ARTHROPLASTY Left 12/28/2016  ? Procedure: LEFT UNICOMPARTMENTAL KNEE;  Surgeon: Marchia Bond, MD;  Location: [facility];  Service: Orthopedics;  Laterality: Left;  ? RADIOLOGY WITH ANESTHESIA N/A 10/28/2016  ? Procedure: MRI - CERVICAL SPINE WITHOUT;  Surgeon: Radiologist, Medication, MD;  Location: [facility];  Service: Radiology;  Laterality: N/A;  ? Stress Cardiolite  11/05/2005  ? TRANSTHORACIC ECHOCARDIOGRAM  10/12/2005  ? ? ?Family History  ?Problem Relation Age of Onset  ? Diabetes Mother   ? Diabetes Father   ? Cancer Brother   ?     Lung Cancer  ? Cancer Daughter   ?     Breast Cancer  ? Colon cancer Neg Hx   ? Colon polyps Neg Hx   ? Esophageal cancer Neg Hx   ? Rectal cancer Neg Hx   ? Stomach cancer Neg Hx   ? ? ?Social History  ? ?  Tobacco Use  ? Smoking status: Never  ? Smokeless tobacco: Never  ?Vaping Use  ? Vaping Use: Never used  ?Substance Use Topics  ? Alcohol use: No  ? Drug use: No  ? ? ? ?Current Outpatient Medications:  ?  amoxicillin-clavulanate (AUGMENTIN) 875-125 MG tablet, Take 1 tablet by mouth 2 (two) times daily., Disp: 20 tablet, Rfl: 0 ?  aspirin 325 MG tablet, Take 325 mg by mouth daily., Disp: , Rfl:  ?  atorvastatin (LIPITOR) 20 MG  tablet, Take 1 tablet by mouth once daily, Disp: 90 tablet, Rfl: 0 ?  Cholecalciferol 50 MCG (2000 UT) CAPS, Take 2,000 Units daily by mouth., Disp: , Rfl:  ?  diltiazem (CARDIZEM LA) 180 MG 24 hr tablet, Take 1 tablet (180 mg total) by mouth daily., Disp: 90 tablet, Rfl: 0 ?  fish oil-omega-3 fatty acids 1000 MG capsule, Take 1 g by mouth daily., Disp: , Rfl:  ?  fluticasone-salmeterol (ADVAIR) 250-50 MCG/ACT AEPB, INHALE 1 DOSE BY MOUTH ONCE DAILY, Disp: 60 each, Rfl: 0 ?  metFORMIN (GLUCOPHAGE-XR) 500 MG 24 hr tablet, TAKE 2 TABLETS BY MOUTH ONCE DAILY WITH BREAKFAST, Disp: 180 tablet, Rfl: 0 ?  montelukast (SINGULAIR) 10 MG tablet, TAKE 1 TABLET BY MOUTH AT BEDTIME, Disp: 90 tablet, Rfl: 0 ?  Multiple Vitamin (MULTIVITAMIN) tablet, Take 1 tablet by mouth daily. , Disp: , Rfl:  ?  pioglitazone (ACTOS) 15 MG tablet, Take 1 tablet by mouth once daily, Disp: 90 tablet, Rfl: 1 ?  traMADol (ULTRAM) 50 MG tablet, TAKE 1 TO 2 TABLETS BY MOUTH AT BEDTIME AS NEEDED, Disp: 45 tablet, Rfl: 0 ?  valsartan (DIOVAN) 160 MG tablet, Take 1 tablet by mouth once daily, Disp: 90 tablet, Rfl: 2 ? ?No Known Allergies ? ?ROS: See pertinent positives and negatives per HPI. ? ?OBSERVATIONS/OBJECTIVE: ? ?VITALS per patient if applicable: ?There were no vitals filed for this visit. ?There is no height or weight on file to calculate BMI. ?  ? ?GENERAL: Alert and oriented. Appears well and in no acute distress. ? ?HEENT: Atraumatic. Conjunctiva clear. No obvious abnormalities on inspection of external nose and ears. ? ?NECK: Normal movements of the head and neck. ? ?LUNGS: On inspection, no signs of respiratory distress. Breathing rate appears normal. No obvious gross SOB, gasping or wheezing, and no conversational dyspnea. ? ?CV: No obvious cyanosis. ? ?MS: Moves all visible extremities without noticeable abnormality. ? ?PSYCH/NEURO: Pleasant and cooperative. No obvious depression or anxiety. Speech and thought processing grossly  intact. ? ?ASSESSMENT AND PLAN: ? ?Problem List Items Addressed This Visit   ?None ?Visit Diagnoses   ? ? Acute non-recurrent frontal sinusitis    -  Primary  ? Continue Zyrtec daily.  Encourage fluids, rest.  With symptoms over 1 week, start Augmentin twice a day for 10 days.  Follow-up if symptoms do not improve  ? Relevant Medications  ? amoxicillin-clavulanate (AUGMENTIN) 875-125 MG tablet  ? ?  ? ?  ?I discussed the assessment and treatment plan with the patient. The patient was provided an opportunity to ask questions and all were answered. The patient agreed with the plan and demonstrated an understanding of the instructions. ?  ?The patient was advised to call back or seek an in-person evaluation if the symptoms worsen or if the condition fails to improve as anticipated. ? ?Time spent on call 9 minutes with patient face to face via video conference. More than 50% of this time was spent in counseling and  coordination of care. 5 minutes total spent in review of patient's record and preparation of their chart. ? ? ?Charyl Dancer, NP  ?

## 2021-05-18 NOTE — Patient Instructions (Signed)
It was great to see you! ? ?Continue Zyrtec daily.  Start Augmentin 1 tablet twice a day with food for 10 days.  Make sure you are eating plenty of rest and drinking fluids. ? ?Let's follow-up if your symptoms do not improve or worsen ? ?Take care, ? ?Vance Peper, NP ? ?

## 2021-06-02 ENCOUNTER — Other Ambulatory Visit: Payer: Self-pay | Admitting: Family Medicine

## 2021-06-02 DIAGNOSIS — G8929 Other chronic pain: Secondary | ICD-10-CM

## 2021-06-26 ENCOUNTER — Encounter: Payer: Self-pay | Admitting: Family Medicine

## 2021-06-26 ENCOUNTER — Ambulatory Visit (INDEPENDENT_AMBULATORY_CARE_PROVIDER_SITE_OTHER): Payer: PPO | Admitting: Family Medicine

## 2021-06-26 VITALS — BP 126/72 | HR 72 | Temp 97.5°F | Ht 71.0 in | Wt 233.6 lb

## 2021-06-26 DIAGNOSIS — J4520 Mild intermittent asthma, uncomplicated: Secondary | ICD-10-CM

## 2021-06-26 DIAGNOSIS — G8929 Other chronic pain: Secondary | ICD-10-CM | POA: Diagnosis not present

## 2021-06-26 DIAGNOSIS — M25562 Pain in left knee: Secondary | ICD-10-CM

## 2021-06-26 DIAGNOSIS — I10 Essential (primary) hypertension: Secondary | ICD-10-CM | POA: Diagnosis not present

## 2021-06-26 DIAGNOSIS — E119 Type 2 diabetes mellitus without complications: Secondary | ICD-10-CM

## 2021-06-26 DIAGNOSIS — E785 Hyperlipidemia, unspecified: Secondary | ICD-10-CM | POA: Diagnosis not present

## 2021-06-26 LAB — BASIC METABOLIC PANEL
BUN: 24 mg/dL — ABNORMAL HIGH (ref 6–23)
CO2: 26 mEq/L (ref 19–32)
Calcium: 9.1 mg/dL (ref 8.4–10.5)
Chloride: 105 mEq/L (ref 96–112)
Creatinine, Ser: 1.19 mg/dL (ref 0.40–1.50)
GFR: 61.52 mL/min (ref >60.00)
Glucose, Bld: 108 mg/dL — ABNORMAL HIGH (ref 70–99)
Potassium: 5.1 mEq/L (ref 3.5–5.1)
Sodium: 140 mEq/L (ref 135–145)

## 2021-06-26 LAB — LDL CHOLESTEROL, DIRECT: Direct LDL: 89 mg/dL

## 2021-06-26 LAB — HEMOGLOBIN A1C: Hgb A1c MFr Bld: 6.2 % (ref 4.6–6.5)

## 2021-06-26 MED ORDER — ATORVASTATIN CALCIUM 40 MG PO TABS: 40.0000 mg | ORAL_TABLET | Freq: Every day | ORAL | 3 refills | Status: DC

## 2021-06-26 MED ORDER — FLUTICASONE-SALMETEROL 250-50 MCG/ACT IN AEPB: INHALATION_SPRAY | RESPIRATORY_TRACT | 5 refills | Status: DC

## 2021-06-26 NOTE — Progress Notes (Signed)
Established Patient Office Visit  Subjective   Patient ID: Dartanyon Frankowski, male    DOB: [birth year]  Age: 71 y.o. MRN: [id number]  Chief Complaint  Patient presents with   Follow-up    6 month follow up, no concerns. Patient fasting.     HPI follow-up of type 2 diabetes, hypertension and elevated LDL cholesterol.  LDL cholesterol has been running in the 90s on 20 of atorvastatin.  He is tolerating the drug well.  Blood pressure controlled.  Diltiazem and valsartan.  Diabetes controlled with metformin and Actos.  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  Knee pain controlled with tramadol.    Review of Systems  Constitutional: Negative.   HENT: Negative.    Eyes:  Negative for blurred vision, discharge and redness.  Respiratory: Negative.    Cardiovascular: Negative.   Gastrointestinal:  Negative for abdominal pain.  Genitourinary: Negative.   Musculoskeletal:  Positive for joint pain. Negative for myalgias.  Skin:  Negative for rash.  Neurological:  Negative for tingling, loss of consciousness and weakness.  Endo/Heme/Allergies:  Negative for polydipsia.     Objective:     BP 126/72 (BP Location: Right Arm, Patient Position: Sitting, Cuff Size: Normal)   Pulse 72   Temp (!) 97.5 F (36.4 C) (Temporal)   Ht '5\' 11"'$  (1.803 m)   Wt 233 lb 9.6 oz (106 kg)   SpO2 97%   BMI 32.58 kg/m    Physical Exam Constitutional:      General: He is not in acute distress.    Appearance: Normal appearance. He is not ill-appearing, toxic-appearing or diaphoretic.  HENT:     Head: Normocephalic and atraumatic.     Right Ear: External ear normal.     Left Ear: External ear normal.     Mouth/Throat:     Mouth: Mucous membranes are moist.     Pharynx: Oropharynx is clear. No oropharyngeal exudate or posterior oropharyngeal erythema.  Eyes:     General: No scleral icterus.       Right eye:  No discharge.        Left eye: No discharge.     Extraocular Movements: Extraocular movements intact.     Conjunctiva/sclera: Conjunctivae normal.     Pupils: Pupils are equal, round, and reactive to light.  Cardiovascular:     Rate and Rhythm: Normal rate and regular rhythm.  Pulmonary:     Effort: Pulmonary effort is normal. No respiratory distress.     Breath sounds: Normal breath sounds.  Abdominal:     General: Bowel sounds are normal.  Musculoskeletal:     Cervical back: No rigidity or tenderness.  Skin:    General: Skin is warm and dry.  Neurological:     Mental Status: He is alert and oriented to person, place, and time.  Psychiatric:        Mood and Affect: Mood normal.        Behavior: Behavior normal.     No results found for any visits on 06/26/21.    The ASCVD Risk score (Arnett DK, et al., 2019) failed to calculate for the following reasons:   The patient has a prior MI or stroke diagnosis    Assessment & Plan:   Problem List Items Addressed This Visit       Cardiovascular and Mediastinum   Essential hypertension - Primary   Relevant Medications  atorvastatin (LIPITOR) 40 MG tablet   Other Relevant Orders   Basic metabolic panel     Respiratory   Asthma   Relevant Medications   fluticasone-salmeterol (ADVAIR) 250-50 MCG/ACT AEPB     Endocrine   Type 2 diabetes mellitus without complication, without long-term current use of insulin (HCC)   Relevant Medications   atorvastatin (LIPITOR) 40 MG tablet   Other Relevant Orders   Basic metabolic panel   Hemoglobin A1c     Other   Dyslipidemia   Relevant Medications   atorvastatin (LIPITOR) 40 MG tablet   Other Relevant Orders   LDL cholesterol, direct   Chronic pain of left knee    Return in about 6 months (around 12/27/2021).  Continue all medicines as above.  I have increased atorvastatin to 40 mg daily.  Libby Maw, MD

## 2021-07-01 ENCOUNTER — Other Ambulatory Visit: Payer: Self-pay | Admitting: Family Medicine

## 2021-07-01 DIAGNOSIS — G8929 Other chronic pain: Secondary | ICD-10-CM

## 2021-07-17 ENCOUNTER — Other Ambulatory Visit: Payer: Self-pay | Admitting: Family Medicine

## 2021-08-19 ENCOUNTER — Other Ambulatory Visit: Payer: Self-pay | Admitting: Family Medicine

## 2021-08-19 DIAGNOSIS — I10 Essential (primary) hypertension: Secondary | ICD-10-CM

## 2021-09-09 DIAGNOSIS — T8484XD Pain due to internal orthopedic prosthetic devices, implants and grafts, subsequent encounter: Secondary | ICD-10-CM | POA: Diagnosis not present

## 2021-09-14 ENCOUNTER — Encounter: Payer: Self-pay | Admitting: Family Medicine

## 2021-09-14 DIAGNOSIS — E119 Type 2 diabetes mellitus without complications: Secondary | ICD-10-CM | POA: Diagnosis not present

## 2021-09-14 DIAGNOSIS — H524 Presbyopia: Secondary | ICD-10-CM | POA: Diagnosis not present

## 2021-09-14 DIAGNOSIS — Z961 Presence of intraocular lens: Secondary | ICD-10-CM | POA: Diagnosis not present

## 2021-09-14 LAB — HM DIABETES EYE EXAM

## 2021-09-17 DIAGNOSIS — M25562 Pain in left knee: Secondary | ICD-10-CM | POA: Diagnosis not present

## 2021-09-21 DIAGNOSIS — T8484XD Pain due to internal orthopedic prosthetic devices, implants and grafts, subsequent encounter: Secondary | ICD-10-CM | POA: Diagnosis not present

## 2021-09-22 ENCOUNTER — Other Ambulatory Visit: Payer: Self-pay | Admitting: Family Medicine

## 2021-09-22 DIAGNOSIS — I10 Essential (primary) hypertension: Secondary | ICD-10-CM

## 2021-09-29 ENCOUNTER — Other Ambulatory Visit: Payer: Self-pay | Admitting: Family Medicine

## 2021-09-29 DIAGNOSIS — G8929 Other chronic pain: Secondary | ICD-10-CM

## 2021-10-19 DIAGNOSIS — M1712 Unilateral primary osteoarthritis, left knee: Secondary | ICD-10-CM | POA: Diagnosis not present

## 2021-10-19 DIAGNOSIS — T8484XD Pain due to internal orthopedic prosthetic devices, implants and grafts, subsequent encounter: Secondary | ICD-10-CM | POA: Diagnosis not present

## 2021-10-21 ENCOUNTER — Other Ambulatory Visit: Payer: Self-pay | Admitting: Family Medicine

## 2021-11-16 DIAGNOSIS — M1712 Unilateral primary osteoarthritis, left knee: Secondary | ICD-10-CM | POA: Diagnosis not present

## 2021-11-23 DIAGNOSIS — M1712 Unilateral primary osteoarthritis, left knee: Secondary | ICD-10-CM | POA: Diagnosis not present

## 2021-12-10 ENCOUNTER — Other Ambulatory Visit: Payer: Self-pay | Admitting: Family Medicine

## 2021-12-21 DIAGNOSIS — M542 Cervicalgia: Secondary | ICD-10-CM | POA: Diagnosis not present

## 2021-12-21 DIAGNOSIS — T8484XD Pain due to internal orthopedic prosthetic devices, implants and grafts, subsequent encounter: Secondary | ICD-10-CM | POA: Diagnosis not present

## 2021-12-26 ENCOUNTER — Emergency Department (HOSPITAL_BASED_OUTPATIENT_CLINIC_OR_DEPARTMENT_OTHER): Payer: PPO

## 2021-12-26 ENCOUNTER — Encounter (HOSPITAL_BASED_OUTPATIENT_CLINIC_OR_DEPARTMENT_OTHER): Payer: Self-pay | Admitting: Emergency Medicine

## 2021-12-26 ENCOUNTER — Other Ambulatory Visit: Payer: Self-pay

## 2021-12-26 ENCOUNTER — Emergency Department (HOSPITAL_BASED_OUTPATIENT_CLINIC_OR_DEPARTMENT_OTHER)
Admission: EM | Admit: 2021-12-26 | Discharge: 2021-12-27 | Disposition: A | Discharge status: Home / Self Care | Payer: PPO | Source: Home / Self Care | Attending: Emergency Medicine | Admitting: Emergency Medicine

## 2021-12-26 DIAGNOSIS — Y92410 Unspecified street and highway as the place of occurrence of the external cause: Secondary | ICD-10-CM | POA: Insufficient documentation

## 2021-12-26 DIAGNOSIS — Z23 Encounter for immunization: Secondary | ICD-10-CM | POA: Insufficient documentation

## 2021-12-26 DIAGNOSIS — J45909 Unspecified asthma, uncomplicated: Secondary | ICD-10-CM | POA: Diagnosis not present

## 2021-12-26 DIAGNOSIS — I6523 Occlusion and stenosis of bilateral carotid arteries: Secondary | ICD-10-CM | POA: Diagnosis not present

## 2021-12-26 DIAGNOSIS — Z79899 Other long term (current) drug therapy: Secondary | ICD-10-CM | POA: Diagnosis not present

## 2021-12-26 DIAGNOSIS — S60511A Abrasion of right hand, initial encounter: Secondary | ICD-10-CM | POA: Insufficient documentation

## 2021-12-26 DIAGNOSIS — Z7982 Long term (current) use of aspirin: Secondary | ICD-10-CM | POA: Insufficient documentation

## 2021-12-26 DIAGNOSIS — R55 Syncope and collapse: Secondary | ICD-10-CM | POA: Diagnosis not present

## 2021-12-26 DIAGNOSIS — S6992XA Unspecified injury of left wrist, hand and finger(s), initial encounter: Secondary | ICD-10-CM | POA: Diagnosis not present

## 2021-12-26 DIAGNOSIS — Z7984 Long term (current) use of oral hypoglycemic drugs: Secondary | ICD-10-CM | POA: Diagnosis not present

## 2021-12-26 DIAGNOSIS — Z8673 Personal history of transient ischemic attack (TIA), and cerebral infarction without residual deficits: Secondary | ICD-10-CM | POA: Diagnosis not present

## 2021-12-26 DIAGNOSIS — I10 Essential (primary) hypertension: Secondary | ICD-10-CM | POA: Diagnosis not present

## 2021-12-26 DIAGNOSIS — E119 Type 2 diabetes mellitus without complications: Secondary | ICD-10-CM | POA: Diagnosis not present

## 2021-12-26 DIAGNOSIS — S6991XA Unspecified injury of right wrist, hand and finger(s), initial encounter: Secondary | ICD-10-CM

## 2021-12-26 DIAGNOSIS — Z041 Encounter for examination and observation following transport accident: Secondary | ICD-10-CM | POA: Diagnosis not present

## 2021-12-26 DIAGNOSIS — Z96652 Presence of left artificial knee joint: Secondary | ICD-10-CM | POA: Diagnosis not present

## 2021-12-26 MED ORDER — OXYCODONE-ACETAMINOPHEN 5-325 MG PO TABS: 1.0000 | ORAL_TABLET | Freq: Four times a day (QID) | ORAL | 0 refills | Status: DC | PRN

## 2021-12-26 MED ORDER — DOXYCYCLINE HYCLATE 100 MG PO CAPS: 100.0000 mg | ORAL_CAPSULE | Freq: Two times a day (BID) | ORAL | 0 refills | Status: DC

## 2021-12-26 MED ORDER — TETANUS-DIPHTH-ACELL PERTUSSIS 5-2.5-18.5 LF-MCG/0.5 IM SUSY
0.5000 mL | PREFILLED_SYRINGE | Freq: Once | INTRAMUSCULAR | Status: AC
  Administered 2021-12-26: 0.5 mL via INTRAMUSCULAR
  Filled 2021-12-26: qty 0.5

## 2021-12-26 MED ORDER — LIDOCAINE HCL 2 % IJ SOLN: 10.0000 mL | Freq: Once | INTRAMUSCULAR | Status: DC
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
## 2021-12-27 ENCOUNTER — Observation Stay (HOSPITAL_COMMUNITY)
Admission: EM | Admit: 2021-12-27 | Discharge: 2021-12-28 | Disposition: A | Discharge status: Home / Self Care | Payer: PPO | Attending: Family Medicine | Admitting: Family Medicine

## 2021-12-27 ENCOUNTER — Emergency Department (HOSPITAL_COMMUNITY): Payer: PPO

## 2021-12-27 ENCOUNTER — Other Ambulatory Visit: Payer: Self-pay

## 2021-12-27 DIAGNOSIS — J45909 Unspecified asthma, uncomplicated: Secondary | ICD-10-CM | POA: Insufficient documentation

## 2021-12-27 DIAGNOSIS — S6992XA Unspecified injury of left wrist, hand and finger(s), initial encounter: Secondary | ICD-10-CM | POA: Insufficient documentation

## 2021-12-27 DIAGNOSIS — R55 Syncope and collapse: Secondary | ICD-10-CM | POA: Diagnosis not present

## 2021-12-27 DIAGNOSIS — Z79899 Other long term (current) drug therapy: Secondary | ICD-10-CM | POA: Insufficient documentation

## 2021-12-27 DIAGNOSIS — Z041 Encounter for examination and observation following transport accident: Secondary | ICD-10-CM | POA: Diagnosis not present

## 2021-12-27 DIAGNOSIS — E785 Hyperlipidemia, unspecified: Secondary | ICD-10-CM | POA: Diagnosis present

## 2021-12-27 DIAGNOSIS — I10 Essential (primary) hypertension: Secondary | ICD-10-CM | POA: Diagnosis not present

## 2021-12-27 DIAGNOSIS — E119 Type 2 diabetes mellitus without complications: Secondary | ICD-10-CM | POA: Insufficient documentation

## 2021-12-27 DIAGNOSIS — Y92410 Unspecified street and highway as the place of occurrence of the external cause: Secondary | ICD-10-CM | POA: Insufficient documentation

## 2021-12-27 DIAGNOSIS — Z8673 Personal history of transient ischemic attack (TIA), and cerebral infarction without residual deficits: Secondary | ICD-10-CM | POA: Diagnosis not present

## 2021-12-27 DIAGNOSIS — S60511A Abrasion of right hand, initial encounter: Secondary | ICD-10-CM | POA: Insufficient documentation

## 2021-12-27 DIAGNOSIS — Z23 Encounter for immunization: Secondary | ICD-10-CM | POA: Insufficient documentation

## 2021-12-27 DIAGNOSIS — Z7984 Long term (current) use of oral hypoglycemic drugs: Secondary | ICD-10-CM | POA: Insufficient documentation

## 2021-12-27 DIAGNOSIS — Z7982 Long term (current) use of aspirin: Secondary | ICD-10-CM | POA: Insufficient documentation

## 2021-12-27 DIAGNOSIS — Z96652 Presence of left artificial knee joint: Secondary | ICD-10-CM | POA: Insufficient documentation

## 2021-12-27 DIAGNOSIS — T1490XA Injury, unspecified, initial encounter: Secondary | ICD-10-CM

## 2021-12-27 DIAGNOSIS — I6523 Occlusion and stenosis of bilateral carotid arteries: Secondary | ICD-10-CM | POA: Diagnosis not present

## 2021-12-27 LAB — CBC WITH DIFFERENTIAL/PLATELET
Abs Immature Granulocytes: 0.03 10*3/uL (ref 0.00–0.07)
Basophils Absolute: 0.1 10*3/uL (ref 0.0–0.1)
Basophils Relative: 1 %
Eosinophils Absolute: 0.1 10*3/uL (ref 0.0–0.5)
Eosinophils Relative: 1 %
HCT: 31.8 % — ABNORMAL LOW (ref 39.0–52.0)
Hemoglobin: 11 g/dL — ABNORMAL LOW (ref 13.0–17.0)
Immature Granulocytes: 0 %
Lymphocytes Relative: 11 %
Lymphs Abs: 1.2 10*3/uL (ref 0.7–4.0)
MCH: 34 pg (ref 26.0–34.0)
MCHC: 34.6 g/dL (ref 30.0–36.0)
MCV: 98.1 fL (ref 80.0–100.0)
Monocytes Absolute: 1.3 10*3/uL — ABNORMAL HIGH (ref 0.1–1.0)
Monocytes Relative: 11 %
Neutro Abs: 8.5 10*3/uL — ABNORMAL HIGH (ref 1.7–7.7)
Neutrophils Relative %: 76 %
Platelets: 277 10*3/uL (ref 150–400)
RBC: 3.24 MIL/uL — ABNORMAL LOW (ref 4.22–5.81)
RDW: 13.4 % (ref 11.5–15.5)
WBC: 11.1 10*3/uL — ABNORMAL HIGH (ref 4.0–10.5)
nRBC: 0 % (ref 0.0–0.2)

## 2021-12-27 LAB — COMPREHENSIVE METABOLIC PANEL
ALT: 24 U/L (ref 0–44)
AST: 30 U/L (ref 15–41)
Albumin: 3.8 g/dL (ref 3.5–5.0)
Alkaline Phosphatase: 77 U/L (ref 38–126)
Anion gap: 12 (ref 5–15)
BUN: 29 mg/dL — ABNORMAL HIGH (ref 8–23)
CO2: 21 mmol/L — ABNORMAL LOW (ref 22–32)
Calcium: 9 mg/dL (ref 8.9–10.3)
Chloride: 109 mmol/L (ref 98–111)
Creatinine, Ser: 1.4 mg/dL — ABNORMAL HIGH (ref 0.61–1.24)
GFR, Estimated: 54 mL/min — ABNORMAL LOW (ref >60)
Glucose, Bld: 105 mg/dL — ABNORMAL HIGH (ref 70–99)
Potassium: 4 mmol/L (ref 3.5–5.1)
Sodium: 142 mmol/L (ref 135–145)
Total Bilirubin: 0.5 mg/dL (ref 0.3–1.2)
Total Protein: 6.6 g/dL (ref 6.5–8.1)

## 2021-12-27 LAB — CBG MONITORING, ED: Glucose-Capillary: 110 mg/dL — ABNORMAL HIGH (ref 70–99)

## 2021-12-27 LAB — I-STAT CHEM 8, ED
BUN: 29 mg/dL — ABNORMAL HIGH (ref 8–23)
Calcium, Ion: 1.16 mmol/L (ref 1.15–1.40)
Chloride: 108 mmol/L (ref 98–111)
Creatinine, Ser: 1.4 mg/dL — ABNORMAL HIGH (ref 0.61–1.24)
Glucose, Bld: 103 mg/dL — ABNORMAL HIGH (ref 70–99)
HCT: 33 % — ABNORMAL LOW (ref 39.0–52.0)
Hemoglobin: 11.2 g/dL — ABNORMAL LOW (ref 13.0–17.0)
Potassium: 3.9 mmol/L (ref 3.5–5.1)
Sodium: 141 mmol/L (ref 135–145)
TCO2: 22 mmol/L (ref 22–32)

## 2021-12-27 MED ORDER — VITAMIN D 25 MCG (1000 UNIT) PO TABS
2000.0000 [IU] | ORAL_TABLET | Freq: Every day | ORAL | Status: DC
  Administered 2021-12-28: 2000 [IU] via ORAL
  Filled 2021-12-27: qty 2

## 2021-12-27 MED ORDER — LACTATED RINGERS IV BOLUS
500.0000 mL | Freq: Once | INTRAVENOUS | Status: AC
  Administered 2021-12-28: 500 mL via INTRAVENOUS

## 2021-12-27 MED ORDER — ATORVASTATIN CALCIUM 40 MG PO TABS
40.0000 mg | ORAL_TABLET | Freq: Every day | ORAL | Status: DC
  Administered 2021-12-28: 40 mg via ORAL
  Filled 2021-12-27: qty 1

## 2021-12-27 MED ORDER — MONTELUKAST SODIUM 10 MG PO TABS
10.0000 mg | ORAL_TABLET | Freq: Every morning | ORAL | Status: DC
  Administered 2021-12-28: 10 mg via ORAL
  Filled 2021-12-27: qty 1

## 2021-12-27 MED ORDER — OMEGA-3-ACID ETHYL ESTERS 1 G PO CAPS
1.0000 g | ORAL_CAPSULE | Freq: Every day | ORAL | Status: DC
  Administered 2021-12-28: 1 g via ORAL
  Filled 2021-12-27 (×2): qty 1

## 2021-12-27 MED ORDER — DILTIAZEM HCL ER COATED BEADS 180 MG PO CP24
180.0000 mg | ORAL_CAPSULE | Freq: Every day | ORAL | Status: DC
  Administered 2021-12-28: 180 mg via ORAL
  Filled 2021-12-27 (×2): qty 1

## 2021-12-27 MED ORDER — KETOROLAC TROMETHAMINE 15 MG/ML IJ SOLN
15.0000 mg | Freq: Once | INTRAMUSCULAR | Status: AC
  Administered 2021-12-28: 15 mg via INTRAVENOUS
  Filled 2021-12-27: qty 1

## 2021-12-27 MED ORDER — MOMETASONE FURO-FORMOTEROL FUM 200-5 MCG/ACT IN AERO
2.0000 | INHALATION_SPRAY | Freq: Two times a day (BID) | RESPIRATORY_TRACT | Status: DC
  Administered 2021-12-28: 2 via RESPIRATORY_TRACT
  Filled 2021-12-27: qty 8.8

## 2021-12-27 MED ORDER — ASPIRIN 325 MG PO TABS
325.0000 mg | ORAL_TABLET | Freq: Every day | ORAL | Status: DC
  Administered 2021-12-28: 325 mg via ORAL
  Filled 2021-12-27: qty 1

## 2021-12-27 MED ORDER — TRAMADOL HCL 50 MG PO TABS
50.0000 mg | ORAL_TABLET | Freq: Every evening | ORAL | Status: DC
  Administered 2021-12-28: 50 mg via ORAL
  Filled 2021-12-27: qty 1

## 2021-12-27 MED ORDER — LACTATED RINGERS IV SOLN: INTRAVENOUS | Status: AC

## 2021-12-27 MED ORDER — INSULIN ASPART 100 UNIT/ML IJ SOLN: 0.0000 [IU] | Freq: Three times a day (TID) | INTRAMUSCULAR | Status: DC

## 2021-12-27 MED ORDER — ENOXAPARIN SODIUM 40 MG/0.4ML IJ SOSY
40.0000 mg | PREFILLED_SYRINGE | INTRAMUSCULAR | Status: DC
  Administered 2021-12-28: 40 mg via SUBCUTANEOUS
  Filled 2021-12-27: qty 0.4
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
## 2021-12-27 NOTE — Assessment & Plan Note (Signed)
Keep on sensitive SSI

## 2021-12-27 NOTE — Assessment & Plan Note (Signed)
Continue statin and omega-3

## 2021-12-27 NOTE — ED Notes (Signed)
Spoke with Anderson Malta at main lab, will add on A1C.
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
## 2021-12-27 NOTE — Assessment & Plan Note (Signed)
Continue daily aspirin 
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
## 2021-12-27 NOTE — Assessment & Plan Note (Signed)
Continue diltiazem Hold valsartan due to AKI
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
## 2021-12-28 ENCOUNTER — Observation Stay (HOSPITAL_BASED_OUTPATIENT_CLINIC_OR_DEPARTMENT_OTHER): Payer: PPO

## 2021-12-28 ENCOUNTER — Observation Stay (HOSPITAL_COMMUNITY): Payer: PPO

## 2021-12-28 DIAGNOSIS — M50221 Other cervical disc displacement at C4-C5 level: Secondary | ICD-10-CM | POA: Diagnosis not present

## 2021-12-28 DIAGNOSIS — R4182 Altered mental status, unspecified: Secondary | ICD-10-CM

## 2021-12-28 DIAGNOSIS — M5021 Other cervical disc displacement,  high cervical region: Secondary | ICD-10-CM | POA: Diagnosis not present

## 2021-12-28 DIAGNOSIS — R55 Syncope and collapse: Secondary | ICD-10-CM | POA: Diagnosis not present

## 2021-12-28 DIAGNOSIS — R569 Unspecified convulsions: Secondary | ICD-10-CM | POA: Diagnosis not present

## 2021-12-28 DIAGNOSIS — Z041 Encounter for examination and observation following transport accident: Secondary | ICD-10-CM | POA: Diagnosis not present

## 2021-12-28 LAB — URINALYSIS, ROUTINE W REFLEX MICROSCOPIC
Bilirubin Urine: NEGATIVE
Glucose, UA: NEGATIVE mg/dL
Hgb urine dipstick: NEGATIVE
Ketones, ur: NEGATIVE mg/dL
Leukocytes,Ua: NEGATIVE
Nitrite: NEGATIVE
Protein, ur: NEGATIVE mg/dL
Specific Gravity, Urine: 1.014 (ref 1.005–1.030)
pH: 5 (ref 5.0–8.0)

## 2021-12-28 LAB — BASIC METABOLIC PANEL
Anion gap: 9 (ref 5–15)
BUN: 23 mg/dL (ref 8–23)
CO2: 23 mmol/L (ref 22–32)
Calcium: 8.6 mg/dL — ABNORMAL LOW (ref 8.9–10.3)
Chloride: 109 mmol/L (ref 98–111)
Creatinine, Ser: 1.25 mg/dL — ABNORMAL HIGH (ref 0.61–1.24)
GFR, Estimated: >60 mL/min (ref >60)
Glucose, Bld: 97 mg/dL (ref 70–99)
Potassium: 3.7 mmol/L (ref 3.5–5.1)
Sodium: 141 mmol/L (ref 135–145)

## 2021-12-28 LAB — CBC
HCT: 28.7 % — ABNORMAL LOW (ref 39.0–52.0)
Hemoglobin: 9.8 g/dL — ABNORMAL LOW (ref 13.0–17.0)
MCH: 34 pg (ref 26.0–34.0)
MCHC: 34.1 g/dL (ref 30.0–36.0)
MCV: 99.7 fL (ref 80.0–100.0)
Platelets: 272 10*3/uL (ref 150–400)
RBC: 2.88 MIL/uL — ABNORMAL LOW (ref 4.22–5.81)
RDW: 13.3 % (ref 11.5–15.5)
WBC: 7.7 10*3/uL (ref 4.0–10.5)
nRBC: 0 % (ref 0.0–0.2)

## 2021-12-28 LAB — HEMOGLOBIN A1C
Hgb A1c MFr Bld: 5.9 % — ABNORMAL HIGH (ref 4.8–5.6)
Mean Plasma Glucose: 122.63 mg/dL

## 2021-12-28 LAB — CBG MONITORING, ED
Glucose-Capillary: 89 mg/dL (ref 70–99)
Glucose-Capillary: 88 mg/dL (ref 70–99)

## 2021-12-28 LAB — ECHOCARDIOGRAM COMPLETE
Area-P 1/2: 3.53 cm2
Height: 71 in
S' Lateral: 3.1 cm
Weight: 3679.99 oz

## 2021-12-28 LAB — RAPID URINE DRUG SCREEN, HOSP PERFORMED
Amphetamines: NOT DETECTED
Barbiturates: NOT DETECTED
Benzodiazepines: NOT DETECTED
Cocaine: NOT DETECTED
Opiates: NOT DETECTED
Tetrahydrocannabinol: NOT DETECTED

## 2021-12-28 MED ORDER — BACLOFEN 10 MG PO TABS
10.0000 mg | ORAL_TABLET | Freq: Three times a day (TID) | ORAL | Status: DC | PRN
  Administered 2021-12-28: 10 mg via ORAL
  Filled 2021-12-28: qty 1

## 2021-12-28 MED ORDER — LORAZEPAM 2 MG/ML IJ SOLN
1.0000 mg | Freq: Once | INTRAMUSCULAR | Status: AC | PRN
  Administered 2021-12-28: 1 mg via INTRAVENOUS
  Filled 2021-12-28: qty 1

## 2021-12-28 MED ORDER — GADOBUTROL 1 MMOL/ML IV SOLN
8.0000 mL | Freq: Once | INTRAVENOUS | Status: AC | PRN
  Administered 2021-12-28: 8 mL via INTRAVENOUS

## 2021-12-28 NOTE — ED Notes (Signed)
Pt returned from ct
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
## 2021-12-28 NOTE — ED Notes (Signed)
Pt transported to MRI 

## 2021-12-28 NOTE — Progress Notes (Signed)
EEG complete - results pending 
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
## 2021-12-28 NOTE — Progress Notes (Signed)
Echocardiogram 2D Echocardiogram has been performed.  Oneal Deputy Ader Fritze RDCS 12/28/2021, 9:10 AM

## 2021-12-28 NOTE — ED Notes (Signed)
Urinal emptied by mistake x 2 will send the next specimen

## 2021-12-29 ENCOUNTER — Other Ambulatory Visit: Payer: Self-pay | Admitting: Family Medicine

## 2021-12-29 ENCOUNTER — Telehealth: Payer: Self-pay

## 2021-12-29 ENCOUNTER — Encounter: Payer: PPO | Admitting: Family Medicine

## 2021-12-29 DIAGNOSIS — G8929 Other chronic pain: Secondary | ICD-10-CM
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
## 2022-01-08 ENCOUNTER — Encounter: Payer: Self-pay | Admitting: Family Medicine

## 2022-01-08 ENCOUNTER — Ambulatory Visit (INDEPENDENT_AMBULATORY_CARE_PROVIDER_SITE_OTHER): Payer: PPO | Admitting: Family Medicine

## 2022-01-08 ENCOUNTER — Encounter: Payer: Self-pay | Admitting: Neurology

## 2022-01-08 VITALS — BP 142/76 | HR 86 | Temp 97.4°F | Ht 71.0 in | Wt 230.0 lb

## 2022-01-08 DIAGNOSIS — R55 Syncope and collapse: Secondary | ICD-10-CM | POA: Diagnosis not present

## 2022-01-08 DIAGNOSIS — Z23 Encounter for immunization: Secondary | ICD-10-CM

## 2022-01-08 DIAGNOSIS — R0683 Snoring: Secondary | ICD-10-CM
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
## 2022-01-20 ENCOUNTER — Other Ambulatory Visit: Payer: Self-pay | Admitting: Family Medicine

## 2022-01-21 ENCOUNTER — Telehealth: Payer: Self-pay | Admitting: Family Medicine

## 2022-01-21 NOTE — Telephone Encounter (Signed)
Informed patient that forms were received but Dr. Ethelene Hal was waiting for patients appointment with neurologist. Per patient he's not scheduled for neurologist until January but he needs form filled out before that appointment. Please advise.

## 2022-01-21 NOTE — Telephone Encounter (Signed)
Pt turned in a form the last time he was here 01/08/22 to be filled out by Dr Ethelene Hal. 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 Pt @ [phone]

## 2022-01-22 ENCOUNTER — Ambulatory Visit (INDEPENDENT_AMBULATORY_CARE_PROVIDER_SITE_OTHER): Payer: PPO | Admitting: Adult Health

## 2022-01-22 ENCOUNTER — Encounter: Payer: Self-pay | Admitting: Adult Health

## 2022-01-22 VITALS — BP 128/64 | HR 72 | Ht 71.0 in | Wt 231.8 lb

## 2022-01-22 DIAGNOSIS — R0683 Snoring: Secondary | ICD-10-CM | POA: Diagnosis not present

## 2022-01-22 NOTE — Telephone Encounter (Signed)
Called to inform of message below, no answer LM to inform patient of message below asked to call with any questions.
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
## 2022-01-22 NOTE — Patient Instructions (Addendum)
Set up split-night sleep study Healthy sleep regimen Do not drive if sleepy Follow-up in 6 weeks to discuss sleep study results and treatment plan

## 2022-01-22 NOTE — Progress Notes (Signed)
Reviewed and agree with assessment/plan.   Chesley Mires, MD [facility] Pulmonary/Critical Care 01/22/2022, 10:25 AM Pager:  [phone]
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
## 2022-01-25 NOTE — Telephone Encounter (Signed)
Patient aware form can not be filled out until app with neurologist

## 2022-02-16 ENCOUNTER — Encounter: Payer: Self-pay | Admitting: Family Medicine

## 2022-02-19 ENCOUNTER — Encounter: Payer: Self-pay | Admitting: Neurology

## 2022-02-19 ENCOUNTER — Ambulatory Visit: Payer: PPO | Admitting: Neurology

## 2022-02-19 VITALS — BP 136/73 | HR 85 | Ht 71.0 in | Wt 234.0 lb

## 2022-02-19 DIAGNOSIS — R55 Syncope and collapse: Secondary | ICD-10-CM
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
## 2022-02-19 NOTE — Patient Instructions (Signed)
Good to meet you. Proceed with sleep study as scheduled. For any changes in symptoms, please call our office. Continue with getting adequate amount of rest. Follow-up as needed.

## 2022-02-24 ENCOUNTER — Ambulatory Visit (INDEPENDENT_AMBULATORY_CARE_PROVIDER_SITE_OTHER): Payer: PPO | Admitting: Family Medicine

## 2022-02-24 ENCOUNTER — Encounter: Payer: Self-pay | Admitting: Family Medicine

## 2022-02-24 VITALS — BP 136/70 | HR 84 | Temp 96.9°F | Ht 71.0 in | Wt 232.0 lb

## 2022-02-24 DIAGNOSIS — R0683 Snoring: Secondary | ICD-10-CM | POA: Diagnosis not present

## 2022-02-24 DIAGNOSIS — I10 Essential (primary) hypertension: Secondary | ICD-10-CM | POA: Diagnosis not present

## 2022-02-24 DIAGNOSIS — R7303 Prediabetes: Secondary | ICD-10-CM | POA: Diagnosis not present

## 2022-02-24 DIAGNOSIS — R55 Syncope and collapse: Secondary | ICD-10-CM

## 2022-02-24 NOTE — Progress Notes (Signed)
Established Patient Office Visit   Subjective:  Patient ID: Edward Hawkins, male    DOB: March 27, 1950  Age: 72 y.o. MRN: [id number]  Chief Complaint  Patient presents with   Follow-up    6 week follow on     HPI Encounter Diagnoses  Name Primary?   Syncope, unspecified syncope type Yes   Snores    Essential hypertension    Prediabetes    Here for DMV paperwork to be sent to the [facility]   Review of Systems  Constitutional: Negative.   HENT: Negative.    Eyes:  Negative for blurred vision, discharge and redness.  Respiratory: Negative.    Cardiovascular: Negative.   Gastrointestinal:  Negative for abdominal pain.  Genitourinary: Negative.   Musculoskeletal: Negative.  Negative for myalgias.  Skin:  Negative for rash.  Neurological:  Negative for tingling, loss of consciousness and weakness.  Endo/Heme/Allergies:  Negative for polydipsia.     Current Outpatient Medications:    aspirin 325 MG tablet, Take 325 mg by mouth daily., Disp: , Rfl:    atorvastatin (LIPITOR) 40 MG tablet, Take 1 tablet (40 mg total) by mouth daily., Disp: 90 tablet, Rfl: 3   Cholecalciferol 50 MCG (2000 UT) CAPS, Take 2,000 Units daily by mouth., Disp: , Rfl:    diltiazem (CARDIZEM CD) 180 MG 24 hr capsule, Take 1 capsule by mouth once daily, Disp: 90 capsule, Rfl: 2   fish oil-omega-3 fatty acids 1000 MG capsule, Take 1 g by mouth daily., Disp: , Rfl:    fluticasone-salmeterol (ADVAIR) 250-50 MCG/ACT AEPB, INHALE 1 DOSE BY MOUTH ONCE DAILY (Patient taking differently: Inhale 1 puff into the lungs in the morning.), Disp: 60 each, Rfl: 5   ibuprofen (ADVIL) 200 MG tablet, Take 600 mg by mouth every 6 (six) hours as needed for mild pain or moderate pain., Disp: , Rfl:    metFORMIN (GLUCOPHAGE-XR) 500 MG 24 hr tablet, TAKE 2 TABLETS BY MOUTH ONCE DAILY WITH BREAKFAST (Patient taking differently: Take 1,000 mg by mouth daily.), Disp: 180 tablet, Rfl: 2   montelukast (SINGULAIR) 10 MG tablet, Take 1  tablet (10 mg total) by mouth in the morning., Disp: 90 tablet, Rfl: 2   Multiple Vitamin (MULTIVITAMIN) tablet, Take 1 tablet by mouth daily. , Disp: , Rfl:    pioglitazone (ACTOS) 15 MG tablet, Take 1 tablet by mouth once daily, Disp: 90 tablet, Rfl: 0   traMADol (ULTRAM) 50 MG tablet, TAKE 1 TO 2 TABLETS BY MOUTH AT BEDTIME AS NEEDED, Disp: 135 tablet, Rfl: 0   valsartan (DIOVAN) 160 MG tablet, Take 1 tablet by mouth once daily (Patient taking differently: Take 160 mg by mouth daily.), Disp: 90 tablet, Rfl: 0   Objective:     BP 136/70 (BP Location: Right Arm, Patient Position: Sitting, Cuff Size: Large)   Pulse 84   Temp (!) 96.9 F (36.1 C) (Temporal)   Ht '5\' 11"'$  (1.803 m)   Wt 232 lb (105.2 kg)   SpO2 98%   BMI 32.36 kg/m    Physical Exam Constitutional:      General: He is not in acute distress.    Appearance: Normal appearance. He is not ill-appearing, toxic-appearing or diaphoretic.  HENT:     Head: Normocephalic and atraumatic.     Right Ear: External ear normal.     Left Ear: External ear normal.  Eyes:     General: No scleral icterus.       Right eye: No discharge.  Left eye: No discharge.     Extraocular Movements: Extraocular movements intact.     Conjunctiva/sclera: Conjunctivae normal.  Pulmonary:     Effort: Pulmonary effort is normal. No respiratory distress.  Skin:    General: Skin is warm and dry.  Neurological:     Mental Status: He is alert and oriented to person, place, and time.  Psychiatric:        Mood and Affect: Mood normal.        Behavior: Behavior normal.      No results found for any visits on 02/24/22.    The ASCVD Risk score (Arnett DK, et al., 2019) failed to calculate for the following reasons:   The patient has a prior MI or stroke diagnosis    Assessment & Plan:   Syncope, unspecified syncope type  Snores  Essential hypertension  Prediabetes    Return in about 4 months (around 06/25/2022).  Patient has  sleep study pending.  Review of Dr. Amparo Bristol note states that he should be free of syncopal episodes for 6 months before driving privileges reinstated.  That will be in May.  Will have him return at that time.  Libby Maw, MD

## 2022-02-26 ENCOUNTER — Ambulatory Visit (INDEPENDENT_AMBULATORY_CARE_PROVIDER_SITE_OTHER): Payer: PPO

## 2022-02-26 VITALS — Ht 71.0 in | Wt 234.0 lb

## 2022-02-26 DIAGNOSIS — Z0000 Encounter for general adult medical examination without abnormal findings: Secondary | ICD-10-CM

## 2022-02-26 NOTE — Progress Notes (Signed)
I connected with Edward Hawkins today by telephone and verified that I am speaking with the correct person using two identifiers. Location patient: home Location provider: work Persons participating in the virtual visit: Edward Hawkins, Glenna Durand LPN.   I discussed the limitations, risks, security and privacy concerns of performing an evaluation and management service by telephone and the availability of in person appointments. I also discussed with the patient that there may be a patient responsible charge related to this service. The patient expressed understanding and verbally consented to this telephonic visit.    Interactive audio and video telecommunications were attempted between this provider and patient, however failed, due to patient having technical difficulties OR patient did not have access to video capability.  We continued and completed visit with audio only.     Vital signs may be patient reported or missing.  Subjective:   Edward Hawkins is a 72 y.o. male who presents for Medicare Annual/Subsequent preventive examination.  Review of Systems     Cardiac Risk Factors include: advanced age (>74mn, >>67women);dyslipidemia;hypertension     Objective:    Today's Vitals   02/26/22 1356  Weight: 234 lb (106.1 kg)  Height: '5\' 11"'$  (1.803 m)   Body mass index is 32.64 kg/m.     02/26/2022    1:59 PM 02/19/2022    8:54 AM 12/27/2021    6:02 PM 12/26/2021   10:31 PM 02/24/2021    9:05 AM 09/13/2019   10:34 AM 11/01/2017    8:10 AM  Advanced Directives  Does Patient Have a Medical Advance Directive? Yes Yes No No Yes Yes No  Type of AParamedicof AJenksLiving will Living will   [place] will [place] Chart? Yes - validated most recent copy scanned in chart (See row information)    No - copy requested No - copy requested   Would patient like  information on creating a medical advance directive?   No - Patient declined        Current Medications (verified) Outpatient Encounter Medications as of 02/26/2022  Medication Sig   aspirin 325 MG tablet Take 325 mg by mouth daily.   atorvastatin (LIPITOR) 40 MG tablet Take 1 tablet (40 mg total) by mouth daily.   Cholecalciferol 50 MCG (2000 UT) CAPS Take 2,000 Units daily by mouth.   diltiazem (CARDIZEM CD) 180 MG 24 hr capsule Take 1 capsule by mouth once daily   fish oil-omega-3 fatty acids 1000 MG capsule Take 1 g by mouth daily.   fluticasone-salmeterol (ADVAIR) 250-50 MCG/ACT AEPB INHALE 1 DOSE BY MOUTH ONCE DAILY (Patient taking differently: Inhale 1 puff into the lungs in the morning.)   ibuprofen (ADVIL) 200 MG tablet Take 600 mg by mouth every 6 (six) hours as needed for mild pain or moderate pain.   metFORMIN (GLUCOPHAGE-XR) 500 MG 24 hr tablet TAKE 2 TABLETS BY MOUTH ONCE DAILY WITH BREAKFAST (Patient taking differently: Take 1,000 mg by mouth daily.)   montelukast (SINGULAIR) 10 MG tablet Take 1 tablet (10 mg total) by mouth in the morning.   Multiple Vitamin (MULTIVITAMIN) tablet Take 1 tablet by mouth daily.    pioglitazone (ACTOS) 15 MG tablet Take 1 tablet by mouth once daily   traMADol (ULTRAM) 50 MG tablet TAKE 1 TO 2 TABLETS BY MOUTH AT BEDTIME AS NEEDED   valsartan (DIOVAN) 160 MG tablet Take 1 tablet by mouth once daily (  Patient taking differently: Take 160 mg by mouth daily.)   No facility-administered encounter medications on file as of 02/26/2022.    Allergies (verified) Patient has no known allergies.   History: Past Medical History:  Diagnosis Date   Arthritis    ASTHMA 10/08/2006   Asthma    last flareup more than 10 yrs ago.   Cataract    extractions   Colon polyps    tubular adenomas   CVA 10/10/2005   DIABETES MELLITUS, TYPE II 10/08/2006   Diverticulosis    HYPERLIPIDEMIA 10/08/2006   HYPERTENSION 10/08/2006   Primary localized osteoarthritis of  left knee 12/28/2016   RECTAL BLEEDING 09/15/2009   S/P LASIK surgery of both eyes    Seasonal allergies    Past Surgical History:  Procedure Laterality Date   CATARACT EXTRACTION, BILATERAL     COLONOSCOPY     EYE SURGERY     FRACTURE SURGERY     KNEE SURGERY     left knee x2   PARTIAL KNEE ARTHROPLASTY Left 12/28/2016   Procedure: LEFT UNICOMPARTMENTAL KNEE;  Surgeon: Marchia Bond, MD;  Location: [facility];  Service: Orthopedics;  Laterality: Left;   RADIOLOGY WITH ANESTHESIA N/A 10/28/2016   Procedure: MRI - CERVICAL SPINE WITHOUT;  Surgeon: Radiologist, Medication, MD;  Location: [facility];  Service: Radiology;  Laterality: N/A;   Stress Cardiolite  11/05/2005   TRANSTHORACIC ECHOCARDIOGRAM  10/12/2005   Family History  Problem Relation Age of Onset   Diabetes Mother    Diabetes Father    Cancer Brother        Lung Cancer   Cancer Daughter        Breast Cancer   Colon cancer Neg Hx    Colon polyps Neg Hx    Esophageal cancer Neg Hx    Rectal cancer Neg Hx    Stomach cancer Neg Hx    Social History   Socioeconomic History   Marital status: Single    Spouse name: Not on file   Number of children: Not on file   Years of education: Not on file   Highest education level: Not on file  Occupational History   Occupation: Works at [employer] Use   Smoking status: Never   Smokeless tobacco: Never  Vaping Use   Vaping Use: Never used  Substance and Sexual Activity   Alcohol use: No   Drug use: No   Sexual activity: Not on file  Other Topics Concern   Not on file  Social History Narrative   Are you right handed or left handed? Right handed   Are you currently employed ? Part time    What is your current occupation?   Do you live at home alone?no    Who lives with you? Family    What type of home do you live in: 1 story or 2 story?  2 story        Social Determinants of Health   Financial Resource Strain: Low Risk  (02/26/2022)   Overall Financial Resource Strain  (CARDIA)    Difficulty of Paying Living Expenses: Not hard at all  Food Insecurity: No Food Insecurity (02/26/2022)   Hunger Vital Sign    Worried About Running Out of Food in the Last Year: Never true    Ran Out of Food in the Last Year: Never true  Transportation Needs: No Transportation Needs (02/26/2022)   PRAPARE - Hydrologist (Medical): No  Lack of Transportation (Non-Medical): No  Physical Activity: Sufficiently Active (02/26/2022)   Exercise Vital Sign    Days of Exercise per Week: 5 days    Minutes of Exercise per Session: 30 min  Stress: No Stress Concern Present (02/26/2022)   [place]    Feeling of Stress : Not at all  Social Connections: Moderately Isolated (02/24/2021)   Social Connection and Isolation Panel [NHANES]    Frequency of Communication with Friends and Family: Twice a week    Frequency of Social Gatherings with Friends and Family: Twice a week    Attends Religious Services: Never    Marine scientist or Organizations: No    Attends Music therapist: Never    Marital Status: Living with partner    Tobacco Counseling Counseling given: Not Answered   Clinical Intake:  Pre-visit preparation completed: Yes  Pain : No/denies pain     Nutritional Status: BMI > 30  Obese Nutritional Risks: None Diabetes: No  How often do you need to have someone help you when you read instructions, pamphlets, or other written materials from your doctor or pharmacy?: 1 - Never  Diabetic? no  Interpreter Needed?: No  Information entered by :: NAllen LPN   Activities of Daily Living    02/26/2022    2:00 PM 02/22/2022   12:42 PM  In your present state of health, do you have any difficulty performing the following activities:  Hearing? 0 0  Vision? 0 0  Difficulty concentrating or making decisions? 0 0  Walking or climbing stairs? 0 0  Dressing or  bathing? 0 0  Doing errands, shopping? 0 0  Preparing Food and eating ? N N  Using the Toilet? N N  In the past six months, have you accidently leaked urine? N N  Do you have problems with loss of bowel control? N N  Managing your Medications? N N  Managing your Finances? N N  Housekeeping or managing your Housekeeping? N N    Patient Care Team: Libby Maw, MD as PCP - General (Family Medicine) Avie Echevaria., MD as Referring Physician (Sports Medicine) Rutherford Guys, MD as Attending Physician (Ophthalmology) Darius Bump, [facility] as Pharmacist (Pharmacist) Cameron Sprang, MD as Consulting Physician (Neurology)  Indicate any recent Medical Services you may have received from other than [facility] providers in the past year (date may be approximate).     Assessment:   This is a routine wellness examination for [place].  Hearing/Vision screen Vision Screening - Comments:: Regular eye exams, Dr. Gershon Crane  Dietary issues and exercise activities discussed: Current Exercise Habits: Home exercise routine, Type of exercise: walking, Time (Minutes): 30, Frequency (Times/Week): 5, Weekly Exercise (Minutes/Week): 150   Goals Addressed             This Visit's Progress    Patient Stated       02/26/2022, wants to lose weight       Depression Screen    02/26/2022    2:00 PM 02/24/2022    8:19 AM 01/08/2022   10:22 AM 06/26/2021    8:06 AM 02/24/2021    9:06 AM 02/24/2021    9:04 AM 12/25/2020   11:09 AM  PHQ 2/9 Scores  PHQ - 2 Score 0 0 0 0 0 0 0    Fall Risk    02/26/2022    2:00 PM 02/24/2022    8:19 AM 02/22/2022  12:42 PM 02/19/2022    8:54 AM 01/08/2022   10:22 AM  Fall Risk   Falls in the past year? 0 0 0 0 0  Number falls in past yr: 0 0 0 0 0  Injury with Fall? 0 0 0 0 1  Risk for fall due to : Medication side effect No Fall Risks   No Fall Risks  Follow up Falls prevention discussed;Education provided;Falls evaluation completed Falls evaluation  completed  Falls evaluation completed Falls evaluation completed    FALL RISK PREVENTION PERTAINING TO THE HOME:  Any stairs in or around the home? Yes  If so, are there any without handrails? No  Home free of loose throw rugs in walkways, pet beds, electrical cords, etc? Yes  Adequate lighting in your home to reduce risk of falls? Yes   ASSISTIVE DEVICES UTILIZED TO PREVENT FALLS:  Life alert? No  Use of a cane, walker or w/c? No  Grab bars in the bathroom? No  Shower chair or bench in shower? Yes  Elevated toilet seat or a handicapped toilet? No   TIMED UP AND GO:  Was the test performed? No .      Cognitive Function:        02/26/2022    2:01 PM  6CIT Screen  What Year? 0 points  What month? 0 points  What time? 0 points  Count back from 20 0 points  Months in reverse 2 points  Repeat phrase 2 points  Total Score 4 points    Immunizations Immunization History  Administered Date(s) Administered   Fluad Quad(high Dose 65+) 10/09/2018, 11/08/2019, 12/25/2020   Influenza, High Dose Seasonal PF 11/01/2016, 11/01/2017, 01/08/2022   PFIZER(Purple Top)SARS-COV-2 Vaccination 02/27/2019, 03/18/2019, 11/24/2019   Pfizer Covid-19 Vaccine Bivalent Booster 74yr & up 11/24/2020   Pneumococcal Conjugate-13 11/06/2018   Pneumococcal Polysaccharide-23 09/28/2010, 10/03/2015   Td 11/09/2000   Tdap 10/01/2013, 12/26/2021    TDAP status: Up to date  Flu Vaccine status: Up to date  Pneumococcal vaccine status: Up to date  Covid-19 vaccine status: Completed vaccines  Qualifies for Shingles Vaccine? Yes   Zostavax completed No   Shingrix Completed?: No.    Education has been provided regarding the importance of this vaccine. Patient has been advised to call insurance company to determine out of pocket expense if they have not yet received this vaccine. Advised may also receive vaccine at local pharmacy or Health Dept. Verbalized acceptance and understanding.  Screening  Tests Health Maintenance  Topic Date Due   COVID-19 Vaccine (5 - 2023-24 season) 10/09/2021   FOOT EXAM  12/25/2021   Diabetic kidney evaluation - Urine ACR  01/05/2022   Medicare Annual Wellness (AWV)  02/24/2022   Zoster Vaccines- Shingrix (1 of 2) 04/09/2022 (Originally 02/13/2000)   HEMOGLOBIN A1C  06/27/2022   OPHTHALMOLOGY EXAM  09/15/2022   Diabetic kidney evaluation - eGFR measurement  12/29/2022   COLONOSCOPY (Pts 45-430yrInsurance coverage will need to be confirmed)  03/05/2026   DTaP/Tdap/Td (4 - Td or Tdap) 12/27/2031   Pneumonia Vaccine 6514Years old  Completed   INFLUENZA VACCINE  Completed   Hepatitis C Screening  Completed   HPV VACCINES  Aged Out    Health Maintenance  Health Maintenance Due  Topic Date Due   COVID-19 Vaccine (5 - 2023-24 season) 10/09/2021   FOOT EXAM  12/25/2021   Diabetic kidney evaluation - Urine ACR  01/05/2022   Medicare Annual Wellness (AWV)  02/24/2022    Colorectal  cancer screening: Type of screening: Colonoscopy. Completed 03/05/2021. Repeat every 5 years  Lung Cancer Screening: (Low Dose CT Chest recommended if Age 109-80 years, 30 pack-year currently smoking OR have quit w/in 15years.) does not qualify.   Lung Cancer Screening Referral: no  Additional Screening:  Hepatitis C Screening: does qualify; Completed 10/03/2015  Vision Screening: Recommended annual ophthalmology exams for early detection of glaucoma and other disorders of the eye. Is the patient up to date with their annual eye exam?  Yes  Who is the provider or what is the name of the office in which the patient attends annual eye exams? Dr. Gershon Crane If pt is not established with a provider, would they like to be referred to a provider to establish care? No .   Dental Screening: Recommended annual dental exams for proper oral hygiene  Community Resource Referral / Chronic Care Management: CRR required this visit?  No   CCM required this visit?  No      Plan:      I have personally reviewed and noted the following in the patient's chart:   Medical and social history Use of alcohol, tobacco or illicit drugs  Current medications and supplements including opioid prescriptions. Patient is not currently taking opioid prescriptions. Functional ability and status Nutritional status Physical activity Advanced directives List of other physicians Hospitalizations, surgeries, and ER visits in previous 12 months Vitals Screenings to include cognitive, depression, and falls Referrals and appointments  In addition, I have reviewed and discussed with patient certain preventive protocols, quality metrics, and best practice recommendations. A written personalized care plan for preventive services as well as general preventive health recommendations were provided to patient.     Kellie Simmering, LPN   06/04/8339   Nurse Notes: none  Due to this being a virtual visit, the after visit summary with patients personalized plan was offered to patient via mail or my-chart.  Patient would like to access on my-chart

## 2022-02-26 NOTE — Patient Instructions (Signed)
Edward Hawkins , Thank you for taking time to come for your Medicare Wellness Visit. I appreciate your ongoing commitment to your health goals. Please review the following plan we discussed and let me know if I can assist you in the future.   These are the goals we discussed:  Goals      Patient Stated     Drink more water & continue walking     Patient Stated     02/26/2022, wants to lose weight        This is a list of the screening recommended for you and due dates:  Health Maintenance  Topic Date Due   COVID-19 Vaccine (5 - 2023-24 season) 10/09/2021   Complete foot exam   12/25/2021   Yearly kidney health urinalysis for diabetes  01/05/2022   Zoster (Shingles) Vaccine (1 of 2) 04/09/2022*   Hemoglobin A1C  06/27/2022   Eye exam for diabetics  09/15/2022   Yearly kidney function blood test for diabetes  12/29/2022   Medicare Annual Wellness Visit  02/27/2023   Colon Cancer Screening  03/05/2026   DTaP/Tdap/Td vaccine (4 - Td or Tdap) 12/27/2031   Pneumonia Vaccine  Completed   Flu Shot  Completed   Hepatitis C Screening: USPSTF Recommendation to screen - Ages 77-79 yo.  Completed   HPV Vaccine  Aged Out  *Topic was postponed. The date shown is not the original due date.    Advanced directives: copy in chart  Conditions/risks identified: none  Next appointment: Follow up in one year for your annual wellness visit.   Preventive Care 72 Years and Older, Male  Preventive care refers to lifestyle choices and visits with your health care provider that can promote health and wellness. What does preventive care include? A yearly physical exam. This is also called an annual well check. Dental exams once or twice a year. Routine eye exams. Ask your health care provider how often you should have your eyes checked. Personal lifestyle choices, including: Daily care of your teeth and gums. Regular physical activity. Eating a healthy diet. Avoiding tobacco and drug  use. Limiting alcohol use. Practicing safe sex. Taking low doses of aspirin every day. Taking vitamin and mineral supplements as recommended by your health care provider. What happens during an annual well check? The services and screenings done by your health care provider during your annual well check will depend on your age, overall health, lifestyle risk factors, and family history of disease. Counseling  Your health care provider may ask you questions about your: Alcohol use. Tobacco use. Drug use. Emotional well-being. Home and relationship well-being. Sexual activity. Eating habits. History of falls. Memory and ability to understand (cognition). Work and work Statistician. Screening  You may have the following tests or measurements: Height, weight, and BMI. Blood pressure. Lipid and cholesterol levels. These may be checked every 5 years, or more frequently if you are over 72 years old. Skin check. Lung cancer screening. You may have this screening every year starting at age 72 if you have a 30-pack-year history of smoking and currently smoke or have quit within the past 15 years. Fecal occult blood test (FOBT) of the stool. You may have this test every year starting at age 72. Flexible sigmoidoscopy or colonoscopy. You may have a sigmoidoscopy every 5 years or a colonoscopy every 10 years starting at age 72. Prostate cancer screening. Recommendations will vary depending on your family history and other risks. Hepatitis C blood test. Hepatitis B blood  test. Sexually transmitted disease (STD) testing. Diabetes screening. This is done by checking your blood sugar (glucose) after you have not eaten for a while (fasting). You may have this done every 1-3 years. Abdominal aortic aneurysm (AAA) screening. You may need this if you are a current or former smoker. Osteoporosis. You may be screened starting at age 72 if you are at high risk. Talk with your health care provider about  your test results, treatment options, and if necessary, the need for more tests. Vaccines  Your health care provider may recommend certain vaccines, such as: Influenza vaccine. This is recommended every year. Tetanus, diphtheria, and acellular pertussis (Tdap, Td) vaccine. You may need a Td booster every 10 years. Zoster vaccine. You may need this after age 72. Pneumococcal 13-valent conjugate (PCV13) vaccine. One dose is recommended after age 72. Pneumococcal polysaccharide (PPSV23) vaccine. One dose is recommended after age 72. Talk to your health care provider about which screenings and vaccines you need and how often you need them. This information is not intended to replace advice given to you by your health care provider. Make sure you discuss any questions you have with your health care provider. Document Released: 02/21/2015 Document Revised: 10/15/2015 Document Reviewed: 11/26/2014 Elsevier Interactive Patient Education  2017 [place] Prevention in the Home Falls can cause injuries. They can happen to people of all ages. There are many things you can do to make your home safe and to help prevent falls. What can I do on the outside of my home? Regularly fix the edges of walkways and driveways and fix any cracks. Remove anything that might make you trip as you walk through a door, such as a raised step or threshold. Trim any bushes or trees on the path to your home. Use bright outdoor lighting. Clear any walking paths of anything that might make someone trip, such as rocks or tools. Regularly check to see if handrails are loose or broken. Make sure that both sides of any steps have handrails. Any raised decks and porches should have guardrails on the edges. Have any leaves, snow, or ice cleared regularly. Use sand or salt on walking paths during winter. Clean up any spills in your garage right away. This includes oil or grease spills. What can I do in the bathroom? Use  night lights. Install grab bars by the toilet and in the tub and shower. Do not use towel bars as grab bars. Use non-skid mats or decals in the tub or shower. If you need to sit down in the shower, use a plastic, non-slip stool. Keep the floor dry. Clean up any water that spills on the floor as soon as it happens. Remove soap buildup in the tub or shower regularly. Attach bath mats securely with double-sided non-slip rug tape. Do not have throw rugs and other things on the floor that can make you trip. What can I do in the bedroom? Use night lights. Make sure that you have a light by your bed that is easy to reach. Do not use any sheets or blankets that are too big for your bed. They should not hang down onto the floor. Have a firm chair that has side arms. You can use this for support while you get dressed. Do not have throw rugs and other things on the floor that can make you trip. What can I do in the kitchen? Clean up any spills right away. Avoid walking on wet floors. Keep items that  you use a lot in easy-to-reach places. If you need to reach something above you, use a strong step stool that has a grab bar. Keep electrical cords out of the way. Do not use floor polish or wax that makes floors slippery. If you must use wax, use non-skid floor wax. Do not have throw rugs and other things on the floor that can make you trip. What can I do with my stairs? Do not leave any items on the stairs. Make sure that there are handrails on both sides of the stairs and use them. Fix handrails that are broken or loose. Make sure that handrails are as long as the stairways. Check any carpeting to make sure that it is firmly attached to the stairs. Fix any carpet that is loose or worn. Avoid having throw rugs at the top or bottom of the stairs. If you do have throw rugs, attach them to the floor with carpet tape. Make sure that you have a light switch at the top of the stairs and the bottom of the  stairs. If you do not have them, ask someone to add them for you. What else can I do to help prevent falls? Wear shoes that: Do not have high heels. Have rubber bottoms. Are comfortable and fit you well. Are closed at the toe. Do not wear sandals. If you use a stepladder: Make sure that it is fully opened. Do not climb a closed stepladder. Make sure that both sides of the stepladder are locked into place. Ask someone to hold it for you, if possible. Clearly mark and make sure that you can see: Any grab bars or handrails. First and last steps. Where the edge of each step is. Use tools that help you move around (mobility aids) if they are needed. These include: Canes. Walkers. Scooters. Crutches. Turn on the lights when you go into a dark area. Replace any light bulbs as soon as they burn out. Set up your furniture so you have a clear path. Avoid moving your furniture around. If any of your floors are uneven, fix them. If there are any pets around you, be aware of where they are. Review your medicines with your doctor. Some medicines can make you feel dizzy. This can increase your chance of falling. Ask your doctor what other things that you can do to help prevent falls. This information is not intended to replace advice given to you by your health care provider. Make sure you discuss any questions you have with your health care provider. Document Released: 11/21/2008 Document Revised: 07/03/2015 Document Reviewed: 03/01/2014 Elsevier Interactive Patient Education  2017 [employer].

## 2022-03-04 ENCOUNTER — Telehealth: Payer: Self-pay | Admitting: Family Medicine

## 2022-03-04 NOTE — Telephone Encounter (Signed)
Caller Name: Pt Call back phone #: [phone]  Reason for Call: Pt would like for Tequila to call him about some paperwork to be sent to the [facility]

## 2022-03-05 ENCOUNTER — Ambulatory Visit: Payer: PPO | Admitting: Adult Health

## 2022-03-05 NOTE — Telephone Encounter (Signed)
Spoke with patient who verbally understood forms were completed and faxed over.

## 2022-03-09 ENCOUNTER — Other Ambulatory Visit: Payer: Self-pay | Admitting: Family Medicine

## 2022-03-18 ENCOUNTER — Ambulatory Visit (HOSPITAL_BASED_OUTPATIENT_CLINIC_OR_DEPARTMENT_OTHER): Payer: PPO | Attending: Adult Health | Admitting: Pulmonary Disease

## 2022-03-18 VITALS — Ht 71.0 in | Wt 232.0 lb

## 2022-03-18 DIAGNOSIS — G4733 Obstructive sleep apnea (adult) (pediatric): Secondary | ICD-10-CM | POA: Insufficient documentation

## 2022-03-18 DIAGNOSIS — R0683 Snoring: Secondary | ICD-10-CM | POA: Diagnosis not present

## 2022-03-29 ENCOUNTER — Other Ambulatory Visit: Payer: Self-pay | Admitting: Family Medicine

## 2022-03-29 DIAGNOSIS — R0683 Snoring: Secondary | ICD-10-CM

## 2022-03-29 DIAGNOSIS — G8929 Other chronic pain: Secondary | ICD-10-CM

## 2022-03-29 DIAGNOSIS — G4733 Obstructive sleep apnea (adult) (pediatric): Secondary | ICD-10-CM

## 2022-03-29 NOTE — Procedures (Signed)
Patient Name: Edward Hawkins, [name] Date: 03/18/2022 Gender: Male D.O.B: [birth year] Age (years): 72 Referring Provider: Tammy Parrett Height (inches): 71 Interpreting Physician: Kara Mead MD, ABSM Weight (lbs): 232 RPSGT: Baxter Flattery BMI: 32 MRN: [id number] Neck Size: 16.00 <br> <br> CLINICAL INFORMATION Sleep Study Type: NPSG    Indication for sleep study: Diabetes, Fatigue, Snoring, Witnesses Apnea / Gasping During Sleep   Epworth Sleepiness Score: 7   SLEEP STUDY TECHNIQUE As per the AASM Manual for the Scoring of Sleep and Associated Events v2.3 (April 2016) with a hypopnea requiring 4% desaturations.  The channels recorded and monitored were frontal, central and occipital EEG, electrooculogram (EOG), submentalis EMG (chin), nasal and oral airflow, thoracic and abdominal wall motion, anterior tibialis EMG, snore microphone, electrocardiogram, and pulse oximetry.  MEDICATIONS Medications self-administered by patient taken the night of the study : N/A  SLEEP ARCHITECTURE The study was initiated at 9:58:49 PM and ended at 4:41:32 AM.  Sleep onset time was 3.2 minutes and the sleep efficiency was 81.4%. The total sleep time was 328 minutes.  Stage REM latency was 224.0 minutes.  The patient spent 4.1% of the night in stage N1 sleep, 84.1% in stage N2 sleep, 0.0% in stage N3 and 11.7% in REM.  Alpha intrusion was absent.  Supine sleep was 53.05%.  RESPIRATORY PARAMETERS The overall apnea/hypopnea index (AHI) was 12.8 per hour. There were 49 total apneas, including 49 obstructive, 0 central and 0 mixed apneas. There were 21 hypopneas and 0 RERAs.  The AHI during Stage REM sleep was 53.0 per hour.  AHI while supine was 11.0 per hour.  The mean oxygen saturation was 92.3%. The minimum SpO2 during sleep was 79.0%.  moderate snoring was noted during this study.  CARDIAC DATA The 2 lead EKG demonstrated sinus rhythm. The mean heart rate was 56.6 beats per  minute. Other EKG findings include: None.   LEG MOVEMENT DATA The total PLMS were 0 with a resulting PLMS index of 0.0. Associated arousal with leg movement index was 0.0 .  IMPRESSIONS - Mild obstructive sleep apnea occurred during this study (AHI = 12.8/h). - Moderate oxygen desaturation was noted during this study (Min O2 = 79.0%). - The patient snored with moderate snoring volume. - No cardiac abnormalities were noted during this study. - Clinically significant periodic limb movements did not occur during sleep. No significant associated arousals   DIAGNOSIS - Obstructive Sleep Apnea (G47.33) - events were predominantly noted during REM sleep   RECOMMENDATIONS - Treatment options include dental appliance or CPAP therapy. Events were predominantly noted during REM sleep. Please investigate for alternative causes of hypersomnolence. - Avoid alcohol, sedatives and other CNS depressants that may worsen sleep apnea and disrupt normal sleep architecture. - Sleep hygiene should be reviewed to assess factors that may improve sleep quality. - Weight management and regular exercise should be initiated or continued if appropriate.   Kara Mead MD Board Certified in [place]

## 2022-03-31 ENCOUNTER — Ambulatory Visit (INDEPENDENT_AMBULATORY_CARE_PROVIDER_SITE_OTHER): Payer: PPO | Admitting: Adult Health

## 2022-03-31 ENCOUNTER — Encounter: Payer: Self-pay | Admitting: Adult Health

## 2022-03-31 VITALS — BP 124/70 | HR 88 | Temp 98.3°F | Ht 71.0 in | Wt 232.0 lb

## 2022-03-31 DIAGNOSIS — G4733 Obstructive sleep apnea (adult) (pediatric): Secondary | ICD-10-CM

## 2022-03-31 DIAGNOSIS — R0683 Snoring: Secondary | ICD-10-CM

## 2022-03-31 NOTE — Progress Notes (Signed)
Reviewed and agree with assessment/plan.   Chesley Mires, MD [facility] Pulmonary/Critical Care 03/31/2022, 1:50 PM Pager:  [phone]
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
## 2022-03-31 NOTE — Patient Instructions (Addendum)
Begin CPAP At bedtime  wear all night long for at least 6hr or more  May try dream wear nasal mask.  Healthy sleep regimen Do not drive if sleepy Follow-up in 3 months and As needed

## 2022-03-31 NOTE — Assessment & Plan Note (Signed)
Mild OSA -patient education given on sleep apnea.  Patient will proceed with CPAP therapy.  Begin CPAP AutoSet 5 to 15 cm H2O.  Will use nasal DreamWear mask  - discussed how weight can impact sleep and risk for sleep disordered breathing - discussed options to assist with weight loss: combination of diet modification, cardiovascular and strength training exercises   - had an extensive discussion regarding the adverse health consequences related to untreated sleep disordered breathing - specifically discussed the risks for hypertension, coronary artery disease, cardiac dysrhythmias, cerebrovascular disease, and diabetes - lifestyle modification discussed   - discussed how sleep disruption can increase risk of accidents, particularly when driving - safe driving practices were discussed   Plan  Patient Instructions  Begin CPAP At bedtime  wear all night long for at least 6hr or more  May try dream wear nasal mask.  Healthy sleep regimen Do not drive if sleepy Follow-up in 3 months and As needed

## 2022-04-08 DIAGNOSIS — G4733 Obstructive sleep apnea (adult) (pediatric): Secondary | ICD-10-CM | POA: Diagnosis not present

## 2022-05-07 DIAGNOSIS — G4733 Obstructive sleep apnea (adult) (pediatric): Secondary | ICD-10-CM | POA: Diagnosis not present

## 2022-05-19 ENCOUNTER — Other Ambulatory Visit: Payer: Self-pay | Admitting: Family Medicine

## 2022-05-19 DIAGNOSIS — I10 Essential (primary) hypertension: Secondary | ICD-10-CM

## 2022-06-07 DIAGNOSIS — G4733 Obstructive sleep apnea (adult) (pediatric): Secondary | ICD-10-CM | POA: Diagnosis not present

## 2022-06-22 ENCOUNTER — Other Ambulatory Visit: Payer: Self-pay | Admitting: Family Medicine

## 2022-06-22 DIAGNOSIS — E785 Hyperlipidemia, unspecified: Secondary | ICD-10-CM

## 2022-06-23 ENCOUNTER — Other Ambulatory Visit: Payer: Self-pay | Admitting: Family Medicine

## 2022-06-23 DIAGNOSIS — G8929 Other chronic pain: Secondary | ICD-10-CM

## 2022-06-25 ENCOUNTER — Ambulatory Visit: Payer: PPO | Admitting: Family Medicine

## 2022-06-28 ENCOUNTER — Encounter: Payer: Self-pay | Admitting: Family Medicine

## 2022-06-28 ENCOUNTER — Ambulatory Visit (INDEPENDENT_AMBULATORY_CARE_PROVIDER_SITE_OTHER): Payer: PPO | Admitting: Family Medicine

## 2022-06-28 VITALS — BP 118/68 | HR 79 | Temp 98.6°F | Ht 71.0 in | Wt 235.0 lb

## 2022-06-28 DIAGNOSIS — J4520 Mild intermittent asthma, uncomplicated: Secondary | ICD-10-CM | POA: Diagnosis not present

## 2022-06-28 DIAGNOSIS — I10 Essential (primary) hypertension: Secondary | ICD-10-CM

## 2022-06-28 DIAGNOSIS — R7303 Prediabetes: Secondary | ICD-10-CM | POA: Diagnosis not present

## 2022-06-28 DIAGNOSIS — E785 Hyperlipidemia, unspecified: Secondary | ICD-10-CM

## 2022-06-28 DIAGNOSIS — Z87898 Personal history of other specified conditions: Secondary | ICD-10-CM

## 2022-06-28 MED ORDER — FLUTICASONE-SALMETEROL 250-50 MCG/ACT IN AEPB: INHALATION_SPRAY | RESPIRATORY_TRACT | 5 refills | Status: DC
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
## 2022-06-29 ENCOUNTER — Ambulatory Visit (INDEPENDENT_AMBULATORY_CARE_PROVIDER_SITE_OTHER): Payer: PPO | Admitting: Adult Health

## 2022-06-29 ENCOUNTER — Encounter: Payer: Self-pay | Admitting: Adult Health

## 2022-06-29 VITALS — BP 126/60 | HR 74 | Ht 71.0 in | Wt 232.2 lb

## 2022-06-29 DIAGNOSIS — G4733 Obstructive sleep apnea (adult) (pediatric): Secondary | ICD-10-CM

## 2022-06-29 NOTE — Patient Instructions (Addendum)
Continue on CPAP At bedtime  wear all night long for at least 6hr or more  Keep up good work.  Healthy sleep regimen Do not drive if sleepy Follow-up in 6 months with Dr. Wynona Neat or Alphonzo Devera NP and As needed

## 2022-06-29 NOTE — Assessment & Plan Note (Signed)
Mild obstructive sleep apnea with excellent control compliance on CPAP.  No changes.  Plan  Patient Instructions  Continue on CPAP At bedtime  wear all night long for at least 6hr or more  Keep up good work.  Healthy sleep regimen Do not drive if sleepy Follow-up in 6 months with Dr. Wynona Neat or Broedy Osbourne NP and As needed
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
## 2022-07-07 DIAGNOSIS — G4733 Obstructive sleep apnea (adult) (pediatric): Secondary | ICD-10-CM | POA: Diagnosis not present

## 2022-07-09 ENCOUNTER — Other Ambulatory Visit: Payer: Self-pay | Admitting: Family Medicine

## 2022-07-27 DIAGNOSIS — G4733 Obstructive sleep apnea (adult) (pediatric): Secondary | ICD-10-CM | POA: Diagnosis not present

## 2022-08-07 DIAGNOSIS — G4733 Obstructive sleep apnea (adult) (pediatric): Secondary | ICD-10-CM | POA: Diagnosis not present

## 2022-08-17 ENCOUNTER — Other Ambulatory Visit: Payer: Self-pay | Admitting: Family Medicine

## 2022-08-17 DIAGNOSIS — I10 Essential (primary) hypertension: Secondary | ICD-10-CM

## 2022-09-06 DIAGNOSIS — G4733 Obstructive sleep apnea (adult) (pediatric): Secondary | ICD-10-CM | POA: Diagnosis not present

## 2022-09-14 ENCOUNTER — Other Ambulatory Visit: Payer: Self-pay | Admitting: Family Medicine

## 2022-09-17 DIAGNOSIS — E119 Type 2 diabetes mellitus without complications: Secondary | ICD-10-CM | POA: Diagnosis not present

## 2022-09-17 DIAGNOSIS — H43812 Vitreous degeneration, left eye: Secondary | ICD-10-CM | POA: Diagnosis not present

## 2022-09-17 DIAGNOSIS — Z961 Presence of intraocular lens: Secondary | ICD-10-CM | POA: Diagnosis not present

## 2022-09-17 DIAGNOSIS — H26493 Other secondary cataract, bilateral: Secondary | ICD-10-CM | POA: Diagnosis not present

## 2022-09-20 ENCOUNTER — Other Ambulatory Visit: Payer: Self-pay | Admitting: Family Medicine

## 2022-09-20 DIAGNOSIS — G8929 Other chronic pain: Secondary | ICD-10-CM

## 2022-09-23 ENCOUNTER — Encounter (INDEPENDENT_AMBULATORY_CARE_PROVIDER_SITE_OTHER): Payer: Self-pay

## 2022-10-07 DIAGNOSIS — G4733 Obstructive sleep apnea (adult) (pediatric): Secondary | ICD-10-CM | POA: Diagnosis not present

## 2022-10-29 ENCOUNTER — Ambulatory Visit: Payer: PPO | Admitting: Family Medicine

## 2022-10-29 ENCOUNTER — Encounter: Payer: Self-pay | Admitting: Family Medicine

## 2022-10-29 VITALS — BP 128/80 | HR 93 | Temp 97.9°F | Ht 71.0 in | Wt 231.2 lb

## 2022-10-29 DIAGNOSIS — J22 Unspecified acute lower respiratory infection: Secondary | ICD-10-CM

## 2022-10-29 MED ORDER — AZITHROMYCIN 250 MG PO TABS: ORAL_TABLET | ORAL | 0 refills | Status: AC

## 2022-10-29 MED ORDER — BENZONATATE 200 MG PO CAPS: 200.0000 mg | ORAL_CAPSULE | Freq: Two times a day (BID) | ORAL | 0 refills | Status: DC | PRN

## 2022-10-29 NOTE — Progress Notes (Signed)
Established Patient Office Visit   Subjective:  Patient ID: Edward Hawkins, male    DOB: Jan 02, 1951  Age: 72 y.o. MRN: [id number]  Chief Complaint  Patient presents with   Annual Exam    CPE/labs.  Fasting today.  C/o having cough, nasal drainage/congestion x 1 week.  Has taken Nyquil.   Neg home covid test    HPI Encounter Diagnoses  Name Primary?   Lower resp. tract infection Yes   1 week history of congestion with cough now productive of scant green phlegm.  Denies fevers chills, difficulty breathing or wheezing.  No facial pressure or teeth pain no chest pain.   Review of Systems  Constitutional: Negative.   HENT:  Positive for congestion. Negative for sore throat.   Eyes:  Negative for blurred vision, discharge and redness.  Respiratory:  Positive for cough and sputum production. Negative for shortness of breath and wheezing.   Cardiovascular: Negative.  Negative for chest pain.  Gastrointestinal:  Negative for abdominal pain.  Genitourinary: Negative.   Musculoskeletal: Negative.  Negative for joint pain and myalgias.  Skin:  Negative for rash.  Neurological:  Negative for tingling, loss of consciousness, weakness and headaches.  Endo/Heme/Allergies:  Negative for polydipsia.     Current Outpatient Medications:    aspirin 325 MG tablet, Take 325 mg by mouth daily., Disp: , Rfl:    atorvastatin (LIPITOR) 40 MG tablet, Take 1 tablet by mouth once daily, Disp: 90 tablet, Rfl: 3   azithromycin (ZITHROMAX) 250 MG tablet, Take 2 tablets on day 1, then 1 tablet daily on days 2 through 5, Disp: 6 tablet, Rfl: 0   benzonatate (TESSALON) 200 MG capsule, Take 1 capsule (200 mg total) by mouth 2 (two) times daily as needed for cough., Disp: 20 capsule, Rfl: 0   CARTIA XT 180 MG 24 hr capsule, Take 1 capsule by mouth once daily, Disp: 90 capsule, Rfl: 0   Cholecalciferol 50 MCG (2000 UT) CAPS, Take 2,000 Units daily by mouth., Disp: , Rfl:    fish oil-omega-3 fatty acids 1000  MG capsule, Take 1 g by mouth daily., Disp: , Rfl:    fluticasone-salmeterol (ADVAIR) 250-50 MCG/ACT AEPB, INHALE 1 DOSE BY MOUTH ONCE DAILY, Disp: 60 each, Rfl: 5   ibuprofen (ADVIL) 200 MG tablet, Take 600 mg by mouth every 6 (six) hours as needed for mild pain or moderate pain., Disp: , Rfl:    metFORMIN (GLUCOPHAGE-XR) 500 MG 24 hr tablet, TAKE 2 TABLETS BY MOUTH ONCE DAILY WITH BREAKFAST, Disp: 180 tablet, Rfl: 0   montelukast (SINGULAIR) 10 MG tablet, Take 1 tablet (10 mg total) by mouth in the morning., Disp: 90 tablet, Rfl: 2   Multiple Vitamin (MULTIVITAMIN) tablet, Take 1 tablet by mouth daily. , Disp: , Rfl:    pioglitazone (ACTOS) 15 MG tablet, Take 1 tablet by mouth once daily, Disp: 90 tablet, Rfl: 3   traMADol (ULTRAM) 50 MG tablet, Take 1-2 tablets (50-100 mg total) by mouth at bedtime as needed., Disp: 135 tablet, Rfl: 0   valsartan (DIOVAN) 160 MG tablet, Take 1 tablet by mouth once daily, Disp: 90 tablet, Rfl: 0   Objective:     BP 128/80   Pulse 93   Temp 97.9 F (36.6 C) (Temporal)   Ht 5\' 11"  (1.803 m)   Wt 231 lb 3.2 oz (104.9 kg)   SpO2 98%   BMI 32.25 kg/m    Physical Exam Constitutional:      General: He is  not in acute distress.    Appearance: Normal appearance. He is not ill-appearing, toxic-appearing or diaphoretic.  HENT:     Head: Normocephalic and atraumatic.     Right Ear: Tympanic membrane, ear canal and external ear normal.     Left Ear: Tympanic membrane, ear canal and external ear normal.  Eyes:     General: No scleral icterus.       Right eye: No discharge.        Left eye: No discharge.     Extraocular Movements: Extraocular movements intact.     Conjunctiva/sclera: Conjunctivae normal.     Pupils: Pupils are equal, round, and reactive to light.  Cardiovascular:     Rate and Rhythm: Normal rate and regular rhythm.  Pulmonary:     Effort: Pulmonary effort is normal. No respiratory distress.     Breath sounds: Normal breath sounds. No  wheezing, rhonchi or rales.  Abdominal:     General: Bowel sounds are normal.  Musculoskeletal:     Cervical back: No rigidity or tenderness.  Skin:    General: Skin is warm and dry.  Neurological:     Mental Status: He is alert and oriented to person, place, and time.  Psychiatric:        Mood and Affect: Mood normal.        Behavior: Behavior normal.      No results found for any visits on 10/29/22.    The ASCVD Risk score (Arnett DK, et al., 2019) failed to calculate for the following reasons:   The patient has a prior MI or stroke diagnosis    Assessment & Plan:   Lower resp. tract infection -     Azithromycin; Take 2 tablets on day 1, then 1 tablet daily on days 2 through 5  Dispense: 6 tablet; Refill: 0 -     Benzonatate; Take 1 capsule (200 mg total) by mouth 2 (two) times daily as needed for cough.  Dispense: 20 capsule; Refill: 0    Return in about 1 week (around 11/05/2022), or if symptoms worsen or fail to improve.    Mliss Sax, MD

## 2022-11-03 LAB — HM DIABETES EYE EXAM

## 2022-11-04 ENCOUNTER — Encounter: Payer: Self-pay | Admitting: Family Medicine

## 2022-11-07 DIAGNOSIS — G4733 Obstructive sleep apnea (adult) (pediatric): Secondary | ICD-10-CM | POA: Diagnosis not present

## 2022-11-08 ENCOUNTER — Other Ambulatory Visit: Payer: Self-pay | Admitting: Family Medicine

## 2022-11-11 ENCOUNTER — Encounter: Payer: Self-pay | Admitting: Family Medicine

## 2022-11-11 ENCOUNTER — Ambulatory Visit: Payer: PPO | Admitting: Family Medicine

## 2022-11-11 VITALS — BP 133/59 | HR 64 | Ht 72.0 in | Wt 233.6 lb

## 2022-11-11 DIAGNOSIS — E119 Type 2 diabetes mellitus without complications: Secondary | ICD-10-CM | POA: Diagnosis not present

## 2022-11-11 DIAGNOSIS — Z125 Encounter for screening for malignant neoplasm of prostate: Secondary | ICD-10-CM | POA: Diagnosis not present

## 2022-11-11 DIAGNOSIS — I10 Essential (primary) hypertension: Secondary | ICD-10-CM | POA: Diagnosis not present

## 2022-11-11 DIAGNOSIS — E785 Hyperlipidemia, unspecified: Secondary | ICD-10-CM | POA: Diagnosis not present

## 2022-11-11 DIAGNOSIS — Z0000 Encounter for general adult medical examination without abnormal findings: Secondary | ICD-10-CM | POA: Diagnosis not present

## 2022-11-11 DIAGNOSIS — Z23 Encounter for immunization: Secondary | ICD-10-CM

## 2022-11-11 LAB — URINALYSIS, ROUTINE W REFLEX MICROSCOPIC
Bilirubin Urine: NEGATIVE
Hgb urine dipstick: NEGATIVE
Ketones, ur: NEGATIVE
Leukocytes,Ua: NEGATIVE
Nitrite: NEGATIVE
RBC / HPF: NONE SEEN (ref 0–?)
Specific Gravity, Urine: 1.01 (ref 1.000–1.030)
Total Protein, Urine: NEGATIVE
Urine Glucose: NEGATIVE
Urobilinogen, UA: 0.2 (ref 0.0–1.0)
pH: 6 (ref 5.0–8.0)

## 2022-11-11 LAB — MICROALBUMIN / CREATININE URINE RATIO
Creatinine,U: 58.6 mg/dL
Microalb Creat Ratio: 1.2 mg/g (ref 0.0–30.0)
Microalb, Ur: <0.7 mg/dL (ref 0.0–1.9)

## 2022-11-11 NOTE — Progress Notes (Signed)
Established Patient Office Visit   Subjective:  Patient ID: Edward Hawkins, male    DOB: 1950-08-26  Age: 72 y.o. MRN: [id number]  Chief Complaint  Patient presents with   Annual Exam    Pt is fasting.     HPI Encounter Diagnoses  Name Primary?   Healthcare maintenance Yes   Need for influenza vaccination    Dyslipidemia    Essential hypertension    Type 2 diabetes mellitus without complication, without long-term current use of insulin (HCC)    Screening for prostate cancer    For follow-up of above and a physical exam.  Doing well.  Continues to stay active by working at [employer] without the long hours.  Continues on medications for above.   Review of Systems  Constitutional: Negative.   HENT: Negative.    Eyes:  Negative for blurred vision, discharge and redness.  Respiratory: Negative.    Cardiovascular: Negative.   Gastrointestinal:  Negative for abdominal pain.  Genitourinary: Negative.   Musculoskeletal:  Positive for joint pain. Negative for myalgias.  Skin:  Negative for rash.  Neurological:  Negative for tingling, loss of consciousness and weakness.  Endo/Heme/Allergies:  Negative for polydipsia.      11/11/2022   10:06 AM 10/29/2022    8:05 AM 06/28/2022    8:20 AM  Depression screen PHQ 2/9  Decreased Interest 0 0 0  Down, Depressed, Hopeless 0 0 0  PHQ - 2 Score 0 0 0  Altered sleeping  0   Tired, decreased energy  0   Change in appetite  0   Feeling bad or failure about yourself   0   Trouble concentrating  0   Moving slowly or fidgety/restless  0   Suicidal thoughts  0   PHQ-9 Score  0   Difficult doing work/chores  Not difficult at all        Current Outpatient Medications:    aspirin 325 MG tablet, Take 325 mg by mouth daily., Disp: , Rfl:    atorvastatin (LIPITOR) 40 MG tablet, Take 1 tablet by mouth once daily, Disp: 90 tablet, Rfl: 3   benzonatate (TESSALON) 200 MG capsule, Take 1 capsule (200 mg total) by mouth 2 (two) times  daily as needed for cough., Disp: 20 capsule, Rfl: 0   CARTIA XT 180 MG 24 hr capsule, Take 1 capsule by mouth once daily, Disp: 90 capsule, Rfl: 0   Cholecalciferol 50 MCG (2000 UT) CAPS, Take 2,000 Units daily by mouth., Disp: , Rfl:    fish oil-omega-3 fatty acids 1000 MG capsule, Take 1 g by mouth daily., Disp: , Rfl:    fluticasone-salmeterol (ADVAIR) 250-50 MCG/ACT AEPB, INHALE 1 DOSE BY MOUTH ONCE DAILY, Disp: 60 each, Rfl: 5   ibuprofen (ADVIL) 200 MG tablet, Take 600 mg by mouth every 6 (six) hours as needed for mild pain or moderate pain., Disp: , Rfl:    metFORMIN (GLUCOPHAGE-XR) 500 MG 24 hr tablet, TAKE 2 TABLETS BY MOUTH ONCE DAILY WITH BREAKFAST, Disp: 180 tablet, Rfl: 0   montelukast (SINGULAIR) 10 MG tablet, TAKE 1 TABLET BY MOUTH IN THE MORNING, Disp: 90 tablet, Rfl: 0   Multiple Vitamin (MULTIVITAMIN) tablet, Take 1 tablet by mouth daily. , Disp: , Rfl:    pioglitazone (ACTOS) 15 MG tablet, Take 1 tablet by mouth once daily, Disp: 90 tablet, Rfl: 3   traMADol (ULTRAM) 50 MG tablet, Take 1-2 tablets (50-100 mg total) by mouth at bedtime as needed., Disp: 135  tablet, Rfl: 0   valsartan (DIOVAN) 160 MG tablet, Take 1 tablet by mouth once daily, Disp: 90 tablet, Rfl: 0   Objective:     BP (!) 133/59   Pulse 64   Ht 6' (1.829 m)   Wt 233 lb 9.6 oz (106 kg)   SpO2 99%   BMI 31.68 kg/m    Physical Exam Constitutional:      General: He is not in acute distress.    Appearance: Normal appearance. He is not ill-appearing, toxic-appearing or diaphoretic.  HENT:     Head: Normocephalic and atraumatic.     Right Ear: External ear normal.     Left Ear: External ear normal.     Mouth/Throat:     Mouth: Mucous membranes are moist.     Pharynx: Oropharynx is clear. No oropharyngeal exudate or posterior oropharyngeal erythema.  Eyes:     General: No scleral icterus.       Right eye: No discharge.        Left eye: No discharge.     Extraocular Movements: Extraocular movements  intact.     Conjunctiva/sclera: Conjunctivae normal.     Pupils: Pupils are equal, round, and reactive to light.  Cardiovascular:     Rate and Rhythm: Normal rate and regular rhythm.     Pulses:          Dorsalis pedis pulses are 1+ on the right side and 1+ on the left side.       Posterior tibial pulses are 1+ on the right side and 1+ on the left side.  Pulmonary:     Effort: Pulmonary effort is normal. No respiratory distress.     Breath sounds: Normal breath sounds. No wheezing or rhonchi.  Abdominal:     General: Bowel sounds are normal.     Tenderness: There is no abdominal tenderness. There is no guarding.     Hernia: A hernia is present. Hernia is present in the umbilical area and ventral area.  Musculoskeletal:     Cervical back: No rigidity or tenderness.  Skin:    General: Skin is warm and dry.  Neurological:     Mental Status: He is alert and oriented to person, place, and time.  Psychiatric:        Mood and Affect: Mood normal.        Behavior: Behavior normal.     Diabetic Foot Exam - Simple   Simple Foot Form Diabetic Foot exam was performed with the following findings: Yes 11/11/2022 10:25 AM  Visual Inspection No deformities, no ulcerations, no other skin breakdown bilaterally: Yes Sensation Testing Intact to touch and monofilament testing bilaterally: Yes Pulse Check Posterior Tibialis and Dorsalis pulse intact bilaterally: Yes Comments     The ASCVD Risk score (Arnett DK, et al., 2019) failed to calculate for the following reasons:   The patient has a prior MI or stroke diagnosis    Assessment & Plan:   Healthcare maintenance  Need for influenza vaccination -     Flu Vaccine Trivalent High Dose (Fluad)  Dyslipidemia -     Comprehensive metabolic panel -     Lipid panel  Essential hypertension -     CBC -     Comprehensive metabolic panel -     Urinalysis, Routine w reflex microscopic -     Microalbumin / creatinine urine ratio  Type 2  diabetes mellitus without complication, without long-term current use of insulin (HCC) -  Comprehensive metabolic panel -     Hemoglobin A1c -     Urinalysis, Routine w reflex microscopic -     Microalbumin / creatinine urine ratio  Screening for prostate cancer -     PSA    Return in about 6 months (around 05/12/2023), or if symptoms worsen or fail to improve.  Recommended COVID, RSV and Shingrix vaccines to his pharmacy.  Information given on preventing disease through vaccinations.  Information was given on health maintenance and disease prevention.  Continue his active and healthy lifestyle.  Medication adjustments made pending results of labs today.  Otherwise, continue all medications as above  Mliss Sax, MD

## 2022-11-11 NOTE — Addendum Note (Signed)
Addended by: Larey Dresser on: 11/11/2022 11:14 AM   Modules accepted: Orders

## 2022-11-12 ENCOUNTER — Other Ambulatory Visit (INDEPENDENT_AMBULATORY_CARE_PROVIDER_SITE_OTHER): Payer: PPO

## 2022-11-12 DIAGNOSIS — E119 Type 2 diabetes mellitus without complications: Secondary | ICD-10-CM

## 2022-11-12 DIAGNOSIS — E785 Hyperlipidemia, unspecified: Secondary | ICD-10-CM | POA: Diagnosis not present

## 2022-11-12 DIAGNOSIS — I10 Essential (primary) hypertension: Secondary | ICD-10-CM

## 2022-11-12 LAB — CBC
HCT: 32.7 % — ABNORMAL LOW (ref 39.0–52.0)
Hemoglobin: 11.1 g/dL — ABNORMAL LOW (ref 13.0–17.0)
MCHC: 34.1 g/dL (ref 30.0–36.0)
MCV: 97.6 fL (ref 78.0–100.0)
Platelets: 330 10*3/uL (ref 150.0–400.0)
RBC: 3.35 Mil/uL — ABNORMAL LOW (ref 4.22–5.81)
RDW: 13.5 % (ref 11.5–15.5)
WBC: 7.7 10*3/uL (ref 4.0–10.5)

## 2022-11-12 LAB — LIPID PANEL
Cholesterol: 136 mg/dL (ref 0–200)
HDL: 45.9 mg/dL (ref >39.00)
LDL Cholesterol: 78 mg/dL (ref 0–99)
NonHDL: 90.42
Total CHOL/HDL Ratio: 3
Triglycerides: 60 mg/dL (ref 0.0–149.0)
VLDL: 12 mg/dL (ref 0.0–40.0)

## 2022-11-12 LAB — COMPREHENSIVE METABOLIC PANEL
ALT: 18 U/L (ref 0–53)
AST: 21 U/L (ref 0–37)
Albumin: 4 g/dL (ref 3.5–5.2)
Alkaline Phosphatase: 88 U/L (ref 39–117)
BUN: 20 mg/dL (ref 6–23)
CO2: 24 meq/L (ref 19–32)
Calcium: 9 mg/dL (ref 8.4–10.5)
Chloride: 104 meq/L (ref 96–112)
Creatinine, Ser: 1.32 mg/dL (ref 0.40–1.50)
GFR: 53.8 mL/min — ABNORMAL LOW (ref >60.00)
Glucose, Bld: 103 mg/dL — ABNORMAL HIGH (ref 70–99)
Potassium: 4.6 meq/L (ref 3.5–5.1)
Sodium: 138 meq/L (ref 135–145)
Total Bilirubin: 0.4 mg/dL (ref 0.2–1.2)
Total Protein: 6.8 g/dL (ref 6.0–8.3)

## 2022-11-12 LAB — PSA: PSA: 0.79 ng/mL (ref 0.10–4.00)

## 2022-11-12 LAB — HEMOGLOBIN A1C: Hgb A1c MFr Bld: 6.5 % (ref 4.6–6.5)

## 2022-11-15 ENCOUNTER — Other Ambulatory Visit: Payer: Self-pay | Admitting: Family Medicine

## 2022-11-15 DIAGNOSIS — I10 Essential (primary) hypertension: Secondary | ICD-10-CM

## 2022-12-07 DIAGNOSIS — G4733 Obstructive sleep apnea (adult) (pediatric): Secondary | ICD-10-CM | POA: Diagnosis not present

## 2022-12-16 ENCOUNTER — Other Ambulatory Visit: Payer: Self-pay | Admitting: Family Medicine

## 2022-12-17 ENCOUNTER — Other Ambulatory Visit: Payer: Self-pay | Admitting: Family Medicine

## 2022-12-17 DIAGNOSIS — G8929 Other chronic pain: Secondary | ICD-10-CM

## 2022-12-21 ENCOUNTER — Telehealth: Payer: Self-pay | Admitting: Family Medicine

## 2022-12-21 DIAGNOSIS — G8929 Other chronic pain: Secondary | ICD-10-CM

## 2022-12-21 MED ORDER — TRAMADOL HCL 50 MG PO TABS: 50.0000 mg | ORAL_TABLET | Freq: Every evening | ORAL | 0 refills | Status: DC | PRN

## 2022-12-21 NOTE — Telephone Encounter (Signed)
Refill request  traMADol (ULTRAM) 50 MG tablet [[id number]]    [facility] [address], [place] - [address] [place] [address] Phone: [phone]  Fax: [phone]

## 2022-12-29 ENCOUNTER — Ambulatory Visit: Payer: PPO | Admitting: Pulmonary Disease

## 2022-12-29 ENCOUNTER — Encounter: Payer: Self-pay | Admitting: Pulmonary Disease

## 2022-12-29 VITALS — BP 137/71 | HR 87 | Ht 72.0 in | Wt 237.0 lb

## 2022-12-29 DIAGNOSIS — G4733 Obstructive sleep apnea (adult) (pediatric): Secondary | ICD-10-CM

## 2022-12-29 NOTE — Patient Instructions (Signed)
Your sleep apnea appears well treated with your CPAP  Continue using CPAP on a nightly basis  Try and get as many hours of good sleep as possible  Call us with significant concerns  Follow-up in about 6 months

## 2022-12-29 NOTE — Progress Notes (Signed)
Edward Hawkins    [id number]    [birth year]  Primary Care Physician:Kremer, Talmadge Coventry, MD  Referring Physician: Mliss Sax, MD [address] [place],  [place] [address]  Chief complaint:   Patient with mild obstructive sleep apnea  HPI:  Follow-up for obstructive sleep apnea Has been using CPAP on a nightly basis Continues to benefit from CPAP use  Diagnosed with sleep apnea about 6 months ago with mild obstructive sleep apnea  Tries to use his CPAP nightly  Feels he is benefiting from it  Wakes up feeling like he is at a good nights rest Occasionally still takes a nap during the day  History of syncopal attack in 2003 led to evaluation for sleep apnea,  Works at [employer] Does not smoke  No changes in his medications  Does have a history of asthma for which he uses Advair -Asthma is well-controlled Has a history of diabetes uses metformin   Outpatient Encounter Medications as of 12/29/2022  Medication Sig   aspirin 325 MG tablet Take 325 mg by mouth daily.   atorvastatin (LIPITOR) 40 MG tablet Take 1 tablet by mouth once daily   benzonatate (TESSALON) 200 MG capsule Take 1 capsule (200 mg total) by mouth 2 (two) times daily as needed for cough.   Cholecalciferol 50 MCG (2000 UT) CAPS Take 2,000 Units daily by mouth.   diltiazem (CARDIZEM CD) 180 MG 24 hr capsule Take 1 capsule by mouth once daily   fish oil-omega-3 fatty acids 1000 MG capsule Take 1 g by mouth daily.   fluticasone-salmeterol (ADVAIR) 250-50 MCG/ACT AEPB INHALE 1 DOSE BY MOUTH ONCE DAILY   ibuprofen (ADVIL) 200 MG tablet Take 600 mg by mouth every 6 (six) hours as needed for mild pain or moderate pain.   metFORMIN (GLUCOPHAGE-XR) 500 MG 24 hr tablet TAKE 2 TABLETS BY MOUTH ONCE DAILY WITH BREAKFAST   montelukast (SINGULAIR) 10 MG tablet TAKE 1 TABLET BY MOUTH IN THE MORNING   Multiple Vitamin (MULTIVITAMIN) tablet Take 1 tablet by mouth daily.     pioglitazone (ACTOS) 15 MG tablet Take 1 tablet by mouth once daily   traMADol (ULTRAM) 50 MG tablet Take 1-2 tablets (50-100 mg total) by mouth at bedtime as needed.   valsartan (DIOVAN) 160 MG tablet Take 1 tablet by mouth once daily   No facility-administered encounter medications on file as of 12/29/2022.    Allergies as of 12/29/2022   (No Known Allergies)    Past Medical History:  Diagnosis Date   Arthritis    ASTHMA 10/08/2006   Asthma    last flareup more than 10 yrs ago.   Cataract    extractions   Colon polyps    tubular adenomas   CVA 10/10/2005   DIABETES MELLITUS, TYPE II 10/08/2006   Diverticulosis    HYPERLIPIDEMIA 10/08/2006   HYPERTENSION 10/08/2006   Primary localized osteoarthritis of left knee 12/28/2016   RECTAL BLEEDING 09/15/2009   S/P LASIK surgery of both eyes    Seasonal allergies     Past Surgical History:  Procedure Laterality Date   CATARACT EXTRACTION, BILATERAL     COLONOSCOPY     EYE SURGERY     FRACTURE SURGERY     KNEE SURGERY     left knee x2   PARTIAL KNEE ARTHROPLASTY Left 12/28/2016   Procedure: LEFT UNICOMPARTMENTAL KNEE;  Surgeon: Teryl Lucy, MD;  Location: [facility] OR;  Service: Orthopedics;  Laterality: Left;  RADIOLOGY WITH ANESTHESIA N/A 10/28/2016   Procedure: MRI - CERVICAL SPINE WITHOUT;  Surgeon: Radiologist, Medication, MD;  Location: [facility] OR;  Service: Radiology;  Laterality: N/A;   Stress Cardiolite  11/05/2005   TRANSTHORACIC ECHOCARDIOGRAM  10/12/2005    Family History  Problem Relation Age of Onset   Diabetes Mother    Diabetes Father    Cancer Brother        Lung Cancer   Cancer Daughter        Breast Cancer   Colon cancer Neg Hx    Colon polyps Neg Hx    Esophageal cancer Neg Hx    Rectal cancer Neg Hx    Stomach cancer Neg Hx     Social History   Socioeconomic History   Marital status: Single    Spouse name: Not on file   Number of children: Not on file   Years of education: Not on file    Highest education level: 11th grade  Occupational History   Occupation: Works at [employer]  Tobacco Use   Smoking status: Never   Smokeless tobacco: Never  Vaping Use   Vaping status: Never Used  Substance and Sexual Activity   Alcohol use: No   Drug use: No   Sexual activity: Yes  Other Topics Concern   Not on file  Social History Narrative   Are you right handed or left handed? Right handed   Are you currently employed ? Part time    What is your current occupation?   Do you live at home alone?no    Who lives with you? Family    What type of home do you live in: 1 story or 2 story?  2 story        Social Determinants of Health   Financial Resource Strain: Low Risk  (06/24/2022)   Overall Financial Resource Strain (CARDIA)    Difficulty of Paying Living Expenses: Not hard at all  Food Insecurity: No Food Insecurity (06/24/2022)   Hunger Vital Sign    Worried About Running Out of Food in the Last Year: Never true    Ran Out of Food in the Last Year: Never true  Transportation Needs: No Transportation Needs (06/24/2022)   PRAPARE - Administrator, Civil Service (Medical): No    Lack of Transportation (Non-Medical): No  Physical Activity: Insufficiently Active (06/24/2022)   Exercise Vital Sign    Days of Exercise per Week: 3 days    Minutes of Exercise per Session: 30 min  Stress: No Stress Concern Present (06/24/2022)   [employer] of Occupational Health - Occupational Stress Questionnaire    Feeling of Stress : Not at all  Social Connections: Unknown (06/24/2022)   Social Connection and Isolation Panel [NHANES]    Frequency of Communication with Friends and Family: Three times a week    Frequency of Social Gatherings with Friends and Family: Once a week    Attends Religious Services: Patient declined    Database administrator or Organizations: No    Attends Engineer, structural: Not on file    Marital Status: Living with partner  Intimate Partner  Violence: Not At Risk (02/24/2021)   Humiliation, Afraid, Rape, and Kick questionnaire    Fear of Current or Ex-Partner: No    Emotionally Abused: No    Physically Abused: No    Sexually Abused: No    Review of Systems  Respiratory:  Positive for apnea. Negative for shortness of breath.  Psychiatric/Behavioral:  Positive for sleep disturbance.     Vitals:   12/29/22 0905  BP: 137/71  Pulse: 87  SpO2: 99%     Physical Exam Constitutional:      Appearance: Normal appearance. He is obese.  HENT:     Head: Normocephalic.     Mouth/Throat:     Mouth: Mucous membranes are moist.  Cardiovascular:     Rate and Rhythm: Normal rate.     Heart sounds: No murmur heard.    No friction rub.  Pulmonary:     Effort: No respiratory distress.     Breath sounds: No stridor. No wheezing or rhonchi.  Musculoskeletal:     Cervical back: No rigidity or tenderness.  Neurological:     Mental Status: He is alert.  Psychiatric:        Mood and Affect: Mood normal.      Data Reviewed: Sleep study reviewed showing mild obstructive sleep apnea  Compliance shows 83% compliance Average use of 5 hours 41 minutes AutoSet 5-50 95 percentile pressure of 9.3 Residual AHI of 0.5  Assessment:  Mild obstructive sleep apnea -Well-controlled with CPAP therapy  History of asthma -Continues on Advair 250 -Symptoms are stable  Hypertension -Controlled  History of diabetes -Compliant with treatment  Plan/Recommendations: Continue using CPAP on a nightly basis  Graded activities as tolerated  Follow-up in about 6 months  Call us with significant concerns  Virl Diamond MD [facility] 12/29/2022, 9:09 AM  CC: Mliss Sax,*

## 2023-01-07 DIAGNOSIS — G4733 Obstructive sleep apnea (adult) (pediatric): Secondary | ICD-10-CM | POA: Diagnosis not present

## 2023-01-26 DIAGNOSIS — G4733 Obstructive sleep apnea (adult) (pediatric): Secondary | ICD-10-CM | POA: Diagnosis not present

## 2023-02-01 ENCOUNTER — Other Ambulatory Visit: Payer: Self-pay | Admitting: Family Medicine

## 2023-02-06 DIAGNOSIS — G4733 Obstructive sleep apnea (adult) (pediatric): Secondary | ICD-10-CM | POA: Diagnosis not present

## 2023-02-09 DIAGNOSIS — G4733 Obstructive sleep apnea (adult) (pediatric): Secondary | ICD-10-CM | POA: Diagnosis not present

## 2023-02-18 ENCOUNTER — Telehealth: Payer: Self-pay | Admitting: Family Medicine

## 2023-02-18 NOTE — Telephone Encounter (Signed)
 error

## 2023-02-23 ENCOUNTER — Telehealth: Payer: Self-pay | Admitting: Family Medicine

## 2023-02-23 ENCOUNTER — Other Ambulatory Visit: Payer: Self-pay

## 2023-02-23 DIAGNOSIS — I10 Essential (primary) hypertension: Secondary | ICD-10-CM

## 2023-02-23 MED ORDER — DILTIAZEM HCL ER COATED BEADS 180 MG PO CP24: 180.0000 mg | ORAL_CAPSULE | Freq: Every day | ORAL | 3 refills | Status: DC

## 2023-02-23 NOTE — Telephone Encounter (Signed)
 Copied from CRM [id number]. Topic: Clinical - Prescription Issue >> Feb 23, 2023  1:58 PM Margarette Shawl wrote: Reason for CRM: Patient was unable to get a refill for diltiazem  (CARDIZEM  CD) 180 MG 24 hr capsule at the [facility] on file, due to being out of stock. The other local stores are out of stock as well. He is out of the medication and has not taken a tablet in 3 days. No current symptoms. [facility]). CB# [phone]

## 2023-02-25 ENCOUNTER — Other Ambulatory Visit: Payer: Self-pay

## 2023-02-25 ENCOUNTER — Ambulatory Visit: Payer: Self-pay | Admitting: Family Medicine

## 2023-02-25 DIAGNOSIS — I10 Essential (primary) hypertension: Secondary | ICD-10-CM

## 2023-02-25 MED ORDER — DILTIAZEM HCL ER COATED BEADS 180 MG PO CP24: 180.0000 mg | ORAL_CAPSULE | Freq: Every day | ORAL | 3 refills | Status: DC

## 2023-02-25 NOTE — Telephone Encounter (Signed)
Copied from CRM [id number]. Topic: Clinical - Red Word Triage >> Feb 25, 2023 11:40 AM Adele Barthel wrote: Red Word that prompted transfer to Nurse Triage: Having issues with receiving diltiazem (CARDIZEM CD) 180 MG 24 hr capsule at pharmacy, [facility] and [facility]. Has been out for 5 days. Would like to see if there alternative medication. No symptoms   Chief Complaint: Unable to fill medication  Symptoms: None Pertinent Negatives: Patient denies any symptoms at this time Disposition: [] ED /[] Urgent Care (no appt availability in office) / [] Appointment(In office/virtual)/ []  [facility]/ [x] Home Care/ [] Refused Recommended Disposition /[] [facility]/ []  Follow-up with PCP Additional Notes: Patient reports that he has been out of his Cardizem for the last 5 days. He states he has tried to get the medication filled but his pharmacy does not have it available. He states he has checked with other pharmacies and they also do not have the medication available. Patient denies any symptoms but wanted to know if there was an alternative medication he could be prescribed.     Reason for Disposition  [1] Prescription refill request for ESSENTIAL medicine (i.e., likelihood of harm to patient if not taken) AND [2] triager unable to refill per department policy  Answer Assessment - Initial Assessment Questions 1. DRUG NAME: "What medicine do you need to have refilled?"     Diltiazem 180 MG  2. REFILLS REMAINING: "How many refills are remaining?" (Note: The label on the medicine or pill bottle will show how many refills are remaining. If there are no refills remaining, then a renewal may be needed.)     Has 3 refills, pharmacy does not have medication  3. EXPIRATION DATE: "What is the expiration date?" (Note: The label states when the prescription will expire, and thus can no longer be refilled.)     N/A 4. PRESCRIBING HCP: "Who prescribed it?" Reason: If prescribed by specialist,  call should be referred to that group.     Dr. Doreene Burke 5. SYMPTOMS: "Do you have any symptoms?"     No  Protocols used: Medication Refill and Renewal Call-A-AH

## 2023-02-28 ENCOUNTER — Ambulatory Visit (INDEPENDENT_AMBULATORY_CARE_PROVIDER_SITE_OTHER): Payer: PPO

## 2023-02-28 DIAGNOSIS — Z0000 Encounter for general adult medical examination without abnormal findings: Secondary | ICD-10-CM

## 2023-02-28 MED ORDER — DILTIAZEM HCL ER 180 MG PO TB24: 180.0000 mg | ORAL_TABLET | Freq: Every day | ORAL | 3 refills | Status: DC

## 2023-02-28 NOTE — Addendum Note (Signed)
Addended by: Andrez Grime on: 02/28/2023 07:56 AM   Modules accepted: Orders

## 2023-02-28 NOTE — Progress Notes (Signed)
Subjective:   Edward Hawkins is a 73 y.o. male who presents for Medicare Annual/Subsequent preventive examination.  Visit Complete: Virtual I connected with  Adelene Amas on 02/28/23 by a audio enabled telemedicine application and verified that I am speaking with the correct person using two identifiers.  Interactive audio and video telecommunications were attempted between this provider and patient, however failed, due to patient having technical difficulties OR patient did not have access to video capability.  We continued and completed visit with audio only.    Patient Location: Home  Provider Location: Office/Clinic  I discussed the limitations of evaluation and management by telemedicine. The patient expressed understanding and agreed to proceed.  Vital Signs: Because this visit was a virtual/telehealth visit, some criteria may be missing or patient reported. Any vitals not documented were not able to be obtained and vitals that have been documented are patient reported.  Patient Medicare AWV questionnaire was completed by the patient on 02/24/2023; I have confirmed that all information answered by patient is correct and no changes since this date.  Cardiac Risk Factors include: advanced age (>59men, >13 women);dyslipidemia;hypertension;male gender     Objective:    Today's Vitals   There is no height or weight on file to calculate BMI.     02/28/2023   10:11 AM 02/26/2022    1:59 PM 02/19/2022    8:54 AM 12/27/2021    6:02 PM 12/26/2021   10:31 PM 02/24/2021    9:05 AM 09/13/2019   10:34 AM  Advanced Directives  Does Patient Have a Medical Advance Directive? Yes Yes Yes No No Yes Yes  Type of Estate agent of [place];Living will Healthcare Power of [place];Living will Living will   Healthcare Power of [place];Living will Healthcare Power of Attorney  Copy of Healthcare Power of Attorney in Chart? Yes - validated most recent copy scanned  in chart (See row information) Yes - validated most recent copy scanned in chart (See row information)    No - copy requested No - copy requested  Would patient like information on creating a medical advance directive?    No - Patient declined       Current Medications (verified) Outpatient Encounter Medications as of 02/28/2023  Medication Sig   aspirin 325 MG tablet Take 325 mg by mouth daily.   atorvastatin (LIPITOR) 40 MG tablet Take 1 tablet by mouth once daily   benzonatate (TESSALON) 200 MG capsule Take 1 capsule (200 mg total) by mouth 2 (two) times daily as needed for cough.   Cholecalciferol 50 MCG (2000 UT) CAPS Take 2,000 Units daily by mouth.   diltiazem (CARDIZEM CD) 180 MG 24 hr capsule Take 1 capsule (180 mg total) by mouth daily.   diltiazem (CARDIZEM LA) 180 MG 24 hr tablet Take 1 tablet (180 mg total) by mouth daily.   fish oil-omega-3 fatty acids 1000 MG capsule Take 1 g by mouth daily.   fluticasone-salmeterol (ADVAIR) 250-50 MCG/ACT AEPB INHALE 1 DOSE BY MOUTH ONCE DAILY   ibuprofen (ADVIL) 200 MG tablet Take 600 mg by mouth every 6 (six) hours as needed for mild pain or moderate pain.   metFORMIN (GLUCOPHAGE-XR) 500 MG 24 hr tablet TAKE 2 TABLETS BY MOUTH ONCE DAILY WITH BREAKFAST   montelukast (SINGULAIR) 10 MG tablet TAKE 1 TABLET BY MOUTH IN THE MORNING   Multiple Vitamin (MULTIVITAMIN) tablet Take 1 tablet by mouth daily.    pioglitazone (ACTOS) 15 MG tablet Take 1 tablet by mouth once  daily   traMADol (ULTRAM) 50 MG tablet Take 1-2 tablets (50-100 mg total) by mouth at bedtime as needed.   valsartan (DIOVAN) 160 MG tablet Take 1 tablet by mouth once daily   No facility-administered encounter medications on file as of 02/28/2023.    Allergies (verified) Patient has no known allergies.   History: Past Medical History:  Diagnosis Date   Arthritis    ASTHMA 10/08/2006   Asthma    last flareup more than 10 yrs ago.   Cataract    extractions   Colon polyps     tubular adenomas   CVA 10/10/2005   DIABETES MELLITUS, TYPE II 10/08/2006   Diverticulosis    HYPERLIPIDEMIA 10/08/2006   HYPERTENSION 10/08/2006   Primary localized osteoarthritis of left knee 12/28/2016   RECTAL BLEEDING 09/15/2009   S/P LASIK surgery of both eyes    Seasonal allergies    Past Surgical History:  Procedure Laterality Date   CATARACT EXTRACTION, BILATERAL     COLONOSCOPY     EYE SURGERY     FRACTURE SURGERY     KNEE SURGERY     left knee x2   PARTIAL KNEE ARTHROPLASTY Left 12/28/2016   Procedure: LEFT UNICOMPARTMENTAL KNEE;  Surgeon: Teryl Lucy, MD;  Location: [facility] OR;  Service: Orthopedics;  Laterality: Left;   RADIOLOGY WITH ANESTHESIA N/A 10/28/2016   Procedure: MRI - CERVICAL SPINE WITHOUT;  Surgeon: Radiologist, Medication, MD;  Location: [facility] OR;  Service: Radiology;  Laterality: N/A;   Stress Cardiolite  11/05/2005   TRANSTHORACIC ECHOCARDIOGRAM  10/12/2005   Family History  Problem Relation Age of Onset   Diabetes Mother    Diabetes Father    Cancer Brother        Lung Cancer   Cancer Daughter        Breast Cancer   Colon cancer Neg Hx    Colon polyps Neg Hx    Esophageal cancer Neg Hx    Rectal cancer Neg Hx    Stomach cancer Neg Hx    Social History   Socioeconomic History   Marital status: Single    Spouse name: Not on file   Number of children: Not on file   Years of education: Not on file   Highest education level: 11th grade  Occupational History   Occupation: Works at [employer]  Tobacco Use   Smoking status: Never   Smokeless tobacco: Never  Vaping Use   Vaping status: Never Used  Substance and Sexual Activity   Alcohol use: No   Drug use: No   Sexual activity: Yes  Other Topics Concern   Not on file  Social History Narrative   Are you right handed or left handed? Right handed   Are you currently employed ? Part time    What is your current occupation?   Do you live at home alone?no    Who lives with you? Family     What type of home do you live in: 1 story or 2 story?  2 story        Social Drivers of Corporate investment banker Strain: Low Risk  (02/28/2023)   Overall Financial Resource Strain (CARDIA)    Difficulty of Paying Living Expenses: Not hard at all  Food Insecurity: No Food Insecurity (02/28/2023)   Hunger Vital Sign    Worried About Running Out of Food in the Last Year: Never true    Ran Out of Food in the Last Year: Never true  Transportation Needs: No Transportation Needs (02/28/2023)   PRAPARE - Administrator, Civil Service (Medical): No    Lack of Transportation (Non-Medical): No  Physical Activity: Sufficiently Active (02/28/2023)   Exercise Vital Sign    Days of Exercise per Week: 7 days    Minutes of Exercise per Session: 30 min  Stress: No Stress Concern Present (02/28/2023)   [employer] of Occupational Health - Occupational Stress Questionnaire    Feeling of Stress : Not at all  Social Connections: Moderately Isolated (02/28/2023)   Social Connection and Isolation Panel [NHANES]    Frequency of Communication with Friends and Family: Three times a week    Frequency of Social Gatherings with Friends and Family: Once a week    Attends Religious Services: Never    Database administrator or Organizations: No    Attends Engineer, structural: Never    Marital Status: Living with partner    Tobacco Counseling Counseling given: Not Answered   Clinical Intake:  Pre-visit preparation completed: Yes  Pain : No/denies pain     Nutritional Risks: None Diabetes: No  How often do you need to have someone help you when you read instructions, pamphlets, or other written materials from your doctor or pharmacy?: 1 - Never  Interpreter Needed?: No  Information entered by :: NAllen LPN   Activities of Daily Living    02/24/2023    6:43 PM  In your present state of health, do you have any difficulty performing the following activities:  Hearing? 0   Vision? 0  Difficulty concentrating or making decisions? 0  Walking or climbing stairs? 0  Dressing or bathing? 0  Doing errands, shopping? 0  Preparing Food and eating ? N  Using the Toilet? N  In the past six months, have you accidently leaked urine? N  Do you have problems with loss of bowel control? N  Managing your Medications? N  Managing your Finances? N  Housekeeping or managing your Housekeeping? N    Patient Care Team: Mliss Sax, MD as PCP - General (Family Medicine) Verlin Dike., MD as Referring Physician (Sports Medicine) Jethro Bolus, MD as Attending Physician (Ophthalmology) Gabriel Carina, [facility] (Inactive) as Pharmacist (Pharmacist) Van Clines, MD as Consulting Physician (Neurology)  Indicate any recent Medical Services you may have received from other than [facility] providers in the past year (date may be approximate).     Assessment:   This is a routine wellness examination for [place].  Hearing/Vision screen Hearing Screening - Comments:: Denies hearing issues Vision Screening - Comments:: Regular eye exams, [facility]   Goals Addressed             This Visit's Progress    Patient Stated       02/28/2023, wants to lose weight       Depression Screen    02/28/2023   10:12 AM 11/11/2022   10:06 AM 10/29/2022    8:05 AM 06/28/2022    8:20 AM 02/26/2022    2:00 PM 02/24/2022    8:19 AM 01/08/2022   10:22 AM  PHQ 2/9 Scores  PHQ - 2 Score 0 0 0 0 0 0 0  PHQ- 9 Score   0        Fall Risk    02/24/2023    6:43 PM 11/11/2022   10:06 AM 10/29/2022    8:05 AM 06/28/2022    8:20 AM 02/26/2022    2:00  PM  Fall Risk   Falls in the past year? 0 0 0 0 0  Number falls in past yr: 0  0 0 0  Injury with Fall? 0  0 0 0  Risk for fall due to : Medication side effect No Fall Risks No Fall Risks No Fall Risks Medication side effect  Follow up Falls prevention discussed;Falls evaluation completed Falls evaluation completed Falls  evaluation completed Falls evaluation completed Falls prevention discussed;Education provided;Falls evaluation completed    MEDICARE RISK AT HOME: Medicare Risk at Home Any stairs in or around the home?: (Patient-Rptd) Yes If so, are there any without handrails?: (Patient-Rptd) No Home free of loose throw rugs in walkways, pet beds, electrical cords, etc?: (Patient-Rptd) Yes Adequate lighting in your home to reduce risk of falls?: (Patient-Rptd) Yes Life alert?: (Patient-Rptd) No Use of a cane, walker or w/c?: (Patient-Rptd) No Grab bars in the bathroom?: (Patient-Rptd) Yes Shower chair or bench in shower?: (Patient-Rptd) Yes Elevated toilet seat or a handicapped toilet?: (Patient-Rptd) No  TIMED UP AND GO:  Was the test performed?  No    Cognitive Function:        02/28/2023   10:12 AM 02/26/2022    2:01 PM  6CIT Screen  What Year? 0 points 0 points  What month? 0 points 0 points  What time? 0 points 0 points  Count back from 20 0 points 0 points  Months in reverse 4 points 2 points  Repeat phrase 2 points 2 points  Total Score 6 points 4 points    Immunizations Immunization History  Administered Date(s) Administered   Fluad Quad(high Dose 65+) 10/09/2018, 11/08/2019, 12/25/2020   Fluad Trivalent(High Dose 65+) 11/11/2022   Influenza, High Dose Seasonal PF 11/01/2016, 11/01/2017, 01/08/2022   PFIZER(Purple Top)SARS-COV-2 Vaccination 02/27/2019, 03/18/2019, 11/24/2019   Pfizer Covid-19 Vaccine Bivalent Booster 22yrs & up 11/24/2020   Pneumococcal Conjugate-13 11/06/2018   Pneumococcal Polysaccharide-23 09/28/2010, 10/03/2015   Td 11/09/2000   Tdap 10/01/2013, 12/26/2021    TDAP status: Up to date  Flu Vaccine status: Up to date  Pneumococcal vaccine status: Up to date  Covid-19 vaccine status: Information provided on how to obtain vaccines.   Qualifies for Shingles Vaccine? Yes   Zostavax completed No   Shingrix Completed?: No.    Education has been provided  regarding the importance of this vaccine. Patient has been advised to call insurance company to determine out of pocket expense if they have not yet received this vaccine. Advised may also receive vaccine at local pharmacy or Health Dept. Verbalized acceptance and understanding.  Screening Tests Health Maintenance  Topic Date Due   Zoster Vaccines- Shingrix (1 of 2) Never done   COVID-19 Vaccine (5 - 2024-25 season) 10/10/2022   HEMOGLOBIN A1C  05/13/2023   OPHTHALMOLOGY EXAM  11/03/2023   Diabetic kidney evaluation - Urine ACR  11/11/2023   FOOT EXAM  11/11/2023   Diabetic kidney evaluation - eGFR measurement  11/12/2023   Medicare Annual Wellness (AWV)  02/28/2024   Colonoscopy  03/05/2026   DTaP/Tdap/Td (4 - Td or Tdap) 12/27/2031   Pneumonia Vaccine 68+ Years old  Completed   INFLUENZA VACCINE  Completed   Hepatitis C Screening  Completed   HPV VACCINES  Aged Out    Health Maintenance  Health Maintenance Due  Topic Date Due   Zoster Vaccines- Shingrix (1 of 2) Never done   COVID-19 Vaccine (5 - 2024-25 season) 10/10/2022    Colorectal cancer screening: Type of screening: Colonoscopy.  Completed 03/05/2021. Repeat every 5 years  Lung Cancer Screening: (Low Dose CT Chest recommended if Age 57-80 years, 20 pack-year currently smoking OR have quit w/in 15years.) does not qualify.   Lung Cancer Screening Referral: no  Additional Screening:  Hepatitis C Screening: does qualify; Completed 10/03/2015  Vision Screening: Recommended annual ophthalmology exams for early detection of glaucoma and other disorders of the eye. Is the patient up to date with their annual eye exam?  Yes  Who is the provider or what is the name of the office in which the patient attends annual eye exams? [facility] If pt is not established with a provider, would they like to be referred to a provider to establish care? No .   Dental Screening: Recommended annual dental exams for proper oral  hygiene  Diabetic Foot Exam: n/a  Community Resource Referral / Chronic Care Management: CRR required this visit?  No   CCM required this visit?  No     Plan:     I have personally reviewed and noted the following in the patient's chart:   Medical and social history Use of alcohol, tobacco or illicit drugs  Current medications and supplements including opioid prescriptions. Patient is not currently taking opioid prescriptions. Functional ability and status Nutritional status Physical activity Advanced directives List of other physicians Hospitalizations, surgeries, and ER visits in previous 12 months Vitals Screenings to include cognitive, depression, and falls Referrals and appointments  In addition, I have reviewed and discussed with patient certain preventive protocols, quality metrics, and best practice recommendations. A written personalized care plan for preventive services as well as general preventive health recommendations were provided to patient.     Barb Merino, LPN   1/61/0960   After Visit Summary: (MyChart) Due to this being a telephonic visit, the after visit summary with patients personalized plan was offered to patient via MyChart   Nurse Notes: none

## 2023-02-28 NOTE — Patient Instructions (Signed)
Edward Hawkins , Thank you for taking time to come for your Medicare Wellness Visit. I appreciate your ongoing commitment to your health goals. Please review the following plan we discussed and let me know if I can assist you in the future.   Referrals/Orders/Follow-Ups/Clinician Recommendations: none  This is a list of the screening recommended for you and due dates:  Health Maintenance  Topic Date Due   Zoster (Shingles) Vaccine (1 of 2) Never done   COVID-19 Vaccine (5 - 2024-25 season) 10/10/2022   Hemoglobin A1C  05/13/2023   Eye exam for diabetics  11/03/2023   Yearly kidney health urinalysis for diabetes  11/11/2023   Complete foot exam   11/11/2023   Yearly kidney function blood test for diabetes  11/12/2023   Medicare Annual Wellness Visit  02/28/2024   Colon Cancer Screening  03/05/2026   DTaP/Tdap/Td vaccine (4 - Td or Tdap) 12/27/2031   Pneumonia Vaccine  Completed   Flu Shot  Completed   Hepatitis C Screening  Completed   HPV Vaccine  Aged Out    Advanced directives: (In Chart) A copy of your advanced directives are scanned into your chart should your provider ever need it.  Next Medicare Annual Wellness Visit scheduled for next year: Yes  insert Preventive Care attachment Insert FALL PREVENTION attachment if needed

## 2023-03-09 DIAGNOSIS — G4733 Obstructive sleep apnea (adult) (pediatric): Secondary | ICD-10-CM | POA: Diagnosis not present

## 2023-03-21 ENCOUNTER — Other Ambulatory Visit: Payer: Self-pay | Admitting: Family Medicine

## 2023-03-21 DIAGNOSIS — G8929 Other chronic pain: Secondary | ICD-10-CM

## 2023-05-12 ENCOUNTER — Ambulatory Visit: Payer: PPO | Admitting: Family Medicine

## 2023-05-12 ENCOUNTER — Other Ambulatory Visit: Payer: Self-pay | Admitting: Family Medicine

## 2023-05-16 ENCOUNTER — Ambulatory Visit: Payer: PPO | Admitting: Family Medicine

## 2023-05-31 ENCOUNTER — Ambulatory Visit (INDEPENDENT_AMBULATORY_CARE_PROVIDER_SITE_OTHER): Admitting: Family Medicine

## 2023-05-31 ENCOUNTER — Encounter: Payer: Self-pay | Admitting: Family Medicine

## 2023-05-31 VITALS — BP 128/70 | HR 67 | Temp 97.8°F | Ht 72.0 in | Wt 237.0 lb

## 2023-05-31 DIAGNOSIS — E119 Type 2 diabetes mellitus without complications: Secondary | ICD-10-CM | POA: Diagnosis not present

## 2023-05-31 DIAGNOSIS — E785 Hyperlipidemia, unspecified: Secondary | ICD-10-CM

## 2023-05-31 DIAGNOSIS — I10 Essential (primary) hypertension: Secondary | ICD-10-CM | POA: Diagnosis not present

## 2023-05-31 DIAGNOSIS — D539 Nutritional anemia, unspecified: Secondary | ICD-10-CM | POA: Diagnosis not present

## 2023-05-31 DIAGNOSIS — Z7984 Long term (current) use of oral hypoglycemic drugs: Secondary | ICD-10-CM | POA: Diagnosis not present

## 2023-05-31 LAB — BASIC METABOLIC PANEL WITH GFR
BUN: 23 mg/dL (ref 6–23)
CO2: 27 meq/L (ref 19–32)
Calcium: 9.3 mg/dL (ref 8.4–10.5)
Chloride: 106 meq/L (ref 96–112)
Creatinine, Ser: 1.22 mg/dL (ref 0.40–1.50)
GFR: 58.9 mL/min — ABNORMAL LOW (ref >60.00)
Glucose, Bld: 85 mg/dL (ref 70–99)
Potassium: 4.7 meq/L (ref 3.5–5.1)
Sodium: 141 meq/L (ref 135–145)

## 2023-05-31 LAB — VITAMIN B12: Vitamin B-12: >1537 pg/mL — ABNORMAL HIGH (ref 211–911)

## 2023-05-31 LAB — CBC WITH DIFFERENTIAL/PLATELET
Basophils Absolute: 0 10*3/uL (ref 0.0–0.1)
Basophils Relative: 0.6 % (ref 0.0–3.0)
Eosinophils Absolute: 0.2 10*3/uL (ref 0.0–0.7)
Eosinophils Relative: 3.9 % (ref 0.0–5.0)
HCT: 35.7 % — ABNORMAL LOW (ref 39.0–52.0)
Hemoglobin: 11.8 g/dL — ABNORMAL LOW (ref 13.0–17.0)
Lymphocytes Relative: 24.8 % (ref 12.0–46.0)
Lymphs Abs: 1.5 10*3/uL (ref 0.7–4.0)
MCHC: 33.2 g/dL (ref 30.0–36.0)
MCV: 99.7 fl (ref 78.0–100.0)
Monocytes Absolute: 0.8 10*3/uL (ref 0.1–1.0)
Monocytes Relative: 12.9 % — ABNORMAL HIGH (ref 3.0–12.0)
Neutro Abs: 3.5 10*3/uL (ref 1.4–7.7)
Neutrophils Relative %: 57.8 % (ref 43.0–77.0)
Platelets: 281 10*3/uL (ref 150.0–400.0)
RBC: 3.58 Mil/uL — ABNORMAL LOW (ref 4.22–5.81)
RDW: 14.1 % (ref 11.5–15.5)
WBC: 6.1 10*3/uL (ref 4.0–10.5)

## 2023-05-31 LAB — HEMOGLOBIN A1C: Hgb A1c MFr Bld: 6.3 % (ref 4.6–6.5)

## 2023-05-31 LAB — LDL CHOLESTEROL, DIRECT: Direct LDL: 82 mg/dL

## 2023-05-31 NOTE — Progress Notes (Addendum)
 Established Patient Office Visit   Subjective:  Patient ID: Edward Hawkins, male    DOB: 04-Oct-1950  Age: 73 y.o. MRN: [id number]  Chief Complaint  Patient presents with   Medical Management of Chronic Issues    6 months follow up. Pt had breakfast at 10am.     HPI Encounter Diagnoses  Name Primary?   Essential hypertension Yes   Dyslipidemia    Type 2 diabetes mellitus without complication, without long-term current use of insulin  (HCC)    Macrocytic anemia    For follow-up of above.  Blood pressure well-controlled with valsartan  and diltiazem .  Continues pioglitazone  and metformin  for control of diabetes.  Macrocytic anemia has been stable.  Denies epistaxis, melena or hematochezia.  Denies hematuria.  Colonoscopy is up-to-date in January 2023.   Review of Systems  Constitutional: Negative.   HENT: Negative.  Negative for nosebleeds.   Eyes:  Negative for blurred vision, discharge and redness.  Respiratory: Negative.    Cardiovascular: Negative.   Gastrointestinal:  Negative for abdominal pain, blood in stool and melena.  Genitourinary: Negative.  Negative for hematuria.  Musculoskeletal: Negative.  Negative for myalgias.  Skin:  Negative for rash.  Neurological:  Negative for tingling, loss of consciousness and weakness.  Endo/Heme/Allergies:  Negative for polydipsia.     Current Outpatient Medications:    aspirin  325 MG tablet, Take 325 mg by mouth daily., Disp: , Rfl:    atorvastatin  (LIPITOR) 40 MG tablet, Take 1 tablet by mouth once daily, Disp: 90 tablet, Rfl: 3   benzonatate  (TESSALON ) 200 MG capsule, Take 1 capsule (200 mg total) by mouth 2 (two) times daily as needed for cough., Disp: 20 capsule, Rfl: 0   Cholecalciferol  50 MCG (2000 UT) CAPS, Take 2,000 Units daily by mouth., Disp: , Rfl:    diltiazem  (CARDIZEM  LA) 180 MG 24 hr tablet, Take 1 tablet (180 mg total) by mouth daily., Disp: 30 tablet, Rfl: 3   fish oil-omega-3 fatty acids 1000 MG capsule, Take  1 g by mouth daily., Disp: , Rfl:    fluticasone -salmeterol (ADVAIR ) 250-50 MCG/ACT AEPB, INHALE 1 DOSE BY MOUTH ONCE DAILY, Disp: 60 each, Rfl: 5   ibuprofen (ADVIL) 200 MG tablet, Take 600 mg by mouth every 6 (six) hours as needed for mild pain or moderate pain., Disp: , Rfl:    metFORMIN  (GLUCOPHAGE -XR) 500 MG 24 hr tablet, TAKE 2 TABLETS BY MOUTH ONCE DAILY WITH BREAKFAST, Disp: 180 tablet, Rfl: 3   montelukast  (SINGULAIR ) 10 MG tablet, TAKE 1 TABLET BY MOUTH IN THE MORNING, Disp: 90 tablet, Rfl: 0   Multiple Vitamin (MULTIVITAMIN) tablet, Take 1 tablet by mouth daily. , Disp: , Rfl:    pioglitazone  (ACTOS ) 15 MG tablet, Take 1 tablet by mouth once daily, Disp: 90 tablet, Rfl: 3   traMADol  (ULTRAM ) 50 MG tablet, TAKE 1 TO 2 TABLETS BY MOUTH AT BEDTIME AS NEEDED, Disp: 135 tablet, Rfl: 0   valsartan  (DIOVAN ) 160 MG tablet, Take 1 tablet by mouth once daily, Disp: 90 tablet, Rfl: 2   diltiazem  (CARDIZEM  CD) 180 MG 24 hr capsule, Take 1 capsule (180 mg total) by mouth daily. (Patient not taking: Reported on 05/31/2023), Disp: 90 capsule, Rfl: 3   Objective:     BP 128/70 (Cuff Size: Normal)   Pulse 67   Temp 97.8 F (36.6 C) (Temporal)   Ht 6' (1.829 m)   Wt 237 lb (107.5 kg)   SpO2 98%   BMI 32.14 kg/m  Wt  Readings from Last 3 Encounters:  05/31/23 237 lb (107.5 kg)  12/29/22 237 lb (107.5 kg)  11/11/22 233 lb 9.6 oz (106 kg)      Physical Exam Constitutional:      General: He is not in acute distress.    Appearance: Normal appearance. He is not ill-appearing, toxic-appearing or diaphoretic.  HENT:     Head: Normocephalic and atraumatic.     Right Ear: External ear normal.     Left Ear: External ear normal.  Eyes:     General: No scleral icterus.       Right eye: No discharge.        Left eye: No discharge.     Extraocular Movements: Extraocular movements intact.     Conjunctiva/sclera: Conjunctivae normal.  Pulmonary:     Effort: Pulmonary effort is normal. No  respiratory distress.  Skin:    General: Skin is warm and dry.  Neurological:     Mental Status: He is alert and oriented to person, place, and time.  Psychiatric:        Mood and Affect: Mood normal.        Behavior: Behavior normal.      No results found for any visits on 05/31/23.    The ASCVD Risk score (Arnett DK, et al., 2019) failed to calculate for the following reasons:   Risk score cannot be calculated because patient has a medical history suggesting prior/existing ASCVD    Assessment & Plan:   Essential hypertension -     Basic metabolic panel with GFR  Dyslipidemia -     LDL cholesterol, direct  Type 2 diabetes mellitus without complication, without long-term current use of insulin  (HCC) -     Basic metabolic panel with GFR -     Hemoglobin A1c  Macrocytic anemia -     CBC with Differential/Platelet -     Vitamin B12 -     Iron , TIBC and Ferritin Panel    Return in about 6 months (around 11/30/2023) for chronic disease follow-up, annual physical.  Continue exercise and weight loss efforts.  Tonna Frederic, MD

## 2023-06-01 LAB — IRON,TIBC AND FERRITIN PANEL
%SAT: 38 % (ref 20–48)
Ferritin: 17 ng/mL — ABNORMAL LOW (ref 24–380)
Iron: 144 ug/dL (ref 50–180)
TIBC: 377 ug/dL (ref 250–425)

## 2023-06-02 MED ORDER — IRON (FERROUS SULFATE) 325 (65 FE) MG PO TABS: 325.0000 mg | ORAL_TABLET | ORAL | 2 refills | Status: DC

## 2023-06-02 NOTE — Addendum Note (Signed)
 Addended by: Delene Feinstein on: 06/02/2023 08:18 AM   Modules accepted: Orders

## 2023-06-15 ENCOUNTER — Other Ambulatory Visit: Payer: Self-pay | Admitting: Family Medicine

## 2023-06-15 DIAGNOSIS — G8929 Other chronic pain: Secondary | ICD-10-CM

## 2023-06-21 ENCOUNTER — Other Ambulatory Visit: Payer: Self-pay | Admitting: Family Medicine

## 2023-06-21 DIAGNOSIS — E785 Hyperlipidemia, unspecified: Secondary | ICD-10-CM

## 2023-06-28 ENCOUNTER — Ambulatory Visit: Admitting: Pulmonary Disease

## 2023-06-28 ENCOUNTER — Encounter: Payer: Self-pay | Admitting: Pulmonary Disease

## 2023-06-28 VITALS — BP 144/74 | HR 74 | Ht 71.0 in | Wt 236.0 lb

## 2023-06-28 DIAGNOSIS — E119 Type 2 diabetes mellitus without complications: Secondary | ICD-10-CM

## 2023-06-28 DIAGNOSIS — Z7951 Long term (current) use of inhaled steroids: Secondary | ICD-10-CM

## 2023-06-28 DIAGNOSIS — G4733 Obstructive sleep apnea (adult) (pediatric): Secondary | ICD-10-CM

## 2023-06-28 DIAGNOSIS — I10 Essential (primary) hypertension: Secondary | ICD-10-CM

## 2023-06-28 DIAGNOSIS — Z9981 Dependence on supplemental oxygen: Secondary | ICD-10-CM | POA: Diagnosis not present

## 2023-06-28 DIAGNOSIS — J45909 Unspecified asthma, uncomplicated: Secondary | ICD-10-CM

## 2023-06-28 NOTE — Patient Instructions (Addendum)
 Try to make sure you are using CPAP every night  Download from your machine shows it is effective at treating your sleep disordered breathing  Put measures in place to ensure that you are using it every night Continue to stay active  Call us  with significant concerns  Follow-up in about 4 months

## 2023-06-28 NOTE — Progress Notes (Signed)
 Edward Hawkins    [id number]    11-Aug-1950  Primary Care Physician:Kremer, Adolm Ahumada, MD  Referring Physician: Tonna Frederic, MD [address] [place],  [place] [address]  Chief complaint:   Patient with mild obstructive sleep apnea on CPAP therapy  HPI:  Compliance has decreased a little bit Falls asleep sometimes not remembering to put it on  Not having any specific problems with CPAP  Did describe improvement in symptoms during his last visit Wakes up feeling like his had a good nights rest  Tries to stay active Gets up to 4 miles of walking and when he is working, also exercises his dogs about 3 times a day  No significant changes to his health since the last visit   Outpatient Encounter Medications as of 06/28/2023  Medication Sig   aspirin  325 MG tablet Take 325 mg by mouth daily.   atorvastatin  (LIPITOR) 40 MG tablet Take 1 tablet by mouth once daily   benzonatate  (TESSALON ) 200 MG capsule Take 1 capsule (200 mg total) by mouth 2 (two) times daily as needed for cough.   Cholecalciferol  50 MCG (2000 UT) CAPS Take 2,000 Units daily by mouth.   diltiazem  (CARDIZEM  LA) 180 MG 24 hr tablet Take 1 tablet (180 mg total) by mouth daily.   fish oil-omega-3 fatty acids 1000 MG capsule Take 1 g by mouth daily.   fluticasone -salmeterol (ADVAIR ) 250-50 MCG/ACT AEPB INHALE 1 DOSE BY MOUTH ONCE DAILY   ibuprofen (ADVIL) 200 MG tablet Take 600 mg by mouth every 6 (six) hours as needed for mild pain or moderate pain.   Iron , Ferrous Sulfate , 325 (65 Fe) MG TABS Take 325 mg by mouth every other day.   metFORMIN  (GLUCOPHAGE -XR) 500 MG 24 hr tablet TAKE 2 TABLETS BY MOUTH ONCE DAILY WITH BREAKFAST   montelukast  (SINGULAIR ) 10 MG tablet TAKE 1 TABLET BY MOUTH IN THE MORNING   Multiple Vitamin (MULTIVITAMIN) tablet Take 1 tablet by mouth daily.    pioglitazone  (ACTOS ) 15 MG tablet Take 1 tablet by mouth once daily   traMADol  (ULTRAM ) 50 MG tablet TAKE 1  TO 2 TABLETS BY MOUTH AT BEDTIME AS NEEDED   valsartan  (DIOVAN ) 160 MG tablet Take 1 tablet by mouth once daily   No facility-administered encounter medications on file as of 06/28/2023.    Allergies as of 06/28/2023   (No Known Allergies)    Past Medical History:  Diagnosis Date   Arthritis    ASTHMA 10/08/2006   Asthma    last flareup more than 10 yrs ago.   Cataract    extractions   Colon polyps    tubular adenomas   CVA 10/10/2005   DIABETES MELLITUS, TYPE II 10/08/2006   Diverticulosis    HYPERLIPIDEMIA 10/08/2006   HYPERTENSION 10/08/2006   Primary localized osteoarthritis of left knee 12/28/2016   RECTAL BLEEDING 09/15/2009   S/P LASIK surgery of both eyes    Seasonal allergies     Past Surgical History:  Procedure Laterality Date   CATARACT EXTRACTION, BILATERAL     COLONOSCOPY     EYE SURGERY     FRACTURE SURGERY     KNEE SURGERY     left knee x2   PARTIAL KNEE ARTHROPLASTY Left 12/28/2016   Procedure: LEFT UNICOMPARTMENTAL KNEE;  Surgeon: Osa Blase, MD;  Location: [facility] OR;  Service: Orthopedics;  Laterality: Left;   RADIOLOGY WITH ANESTHESIA N/A 10/28/2016   Procedure: MRI - CERVICAL SPINE  WITHOUT;  Surgeon: Radiologist, Medication, MD;  Location: [facility] OR;  Service: Radiology;  Laterality: N/A;   Stress Cardiolite  11/05/2005   TRANSTHORACIC ECHOCARDIOGRAM  10/12/2005    Family History  Problem Relation Age of Onset   Diabetes Mother    Diabetes Father    Cancer Brother        Lung Cancer   Cancer Daughter        Breast Cancer   Colon cancer Neg Hx    Colon polyps Neg Hx    Esophageal cancer Neg Hx    Rectal cancer Neg Hx    Stomach cancer Neg Hx     Social History   Socioeconomic History   Marital status: Single    Spouse name: Not on file   Number of children: Not on file   Years of education: Not on file   Highest education level: 11th grade  Occupational History   Occupation: Works at [employer]  Tobacco Use   Smoking status: Never    Smokeless tobacco: Never  Vaping Use   Vaping status: Never Used  Substance and Sexual Activity   Alcohol use: No   Drug use: No   Sexual activity: Yes  Other Topics Concern   Not on file  Social History Narrative   Are you right handed or left handed? Right handed   Are you currently employed ? Part time    What is your current occupation?   Do you live at home alone?no    Who lives with you? Family    What type of home do you live in: 1 story or 2 story?  2 story        Social Drivers of Corporate investment banker Strain: Low Risk  (05/24/2023)   Overall Financial Resource Strain (CARDIA)    Difficulty of Paying Living Expenses: Not hard at all  Food Insecurity: No Food Insecurity (05/24/2023)   Hunger Vital Sign    Worried About Running Out of Food in the Last Year: Never true    Ran Out of Food in the Last Year: Never true  Transportation Needs: No Transportation Needs (05/24/2023)   PRAPARE - Administrator, Civil Service (Medical): No    Lack of Transportation (Non-Medical): No  Physical Activity: Insufficiently Active (05/24/2023)   Exercise Vital Sign    Days of Exercise per Week: 7 days    Minutes of Exercise per Session: 20 min  Stress: No Stress Concern Present (05/24/2023)   [employer] of Occupational Health - Occupational Stress Questionnaire    Feeling of Stress : Not at all  Social Connections: Moderately Isolated (05/24/2023)   Social Connection and Isolation Panel [NHANES]    Frequency of Communication with Friends and Family: Three times a week    Frequency of Social Gatherings with Friends and Family: Once a week    Attends Religious Services: Never    Database administrator or Organizations: No    Attends Banker Meetings: Never    Marital Status: Living with partner  Intimate Partner Violence: Not At Risk (02/28/2023)   Humiliation, Afraid, Rape, and Kick questionnaire    Fear of Current or Ex-Partner: No    Emotionally  Abused: No    Physically Abused: No    Sexually Abused: No    Review of Systems  Respiratory:  Positive for apnea.   Psychiatric/Behavioral:  Positive for sleep disturbance.     Vitals:   06/28/23 0926  BP: Aaron Aas)  144/74  Pulse: 74  SpO2: 97%     Physical Exam Constitutional:      Appearance: He is obese.  HENT:     Head: Normocephalic.     Mouth/Throat:     Mouth: Mucous membranes are moist.  Eyes:     General: No scleral icterus. Cardiovascular:     Rate and Rhythm: Normal rate and regular rhythm.     Heart sounds: No murmur heard.    No friction rub.  Pulmonary:     Effort: No respiratory distress.     Breath sounds: No stridor. No wheezing or rhonchi.  Musculoskeletal:     Cervical back: No rigidity or tenderness.  Neurological:     Mental Status: He is alert.  Psychiatric:        Mood and Affect: Mood normal.    Data Reviewed: Compliance data reviewed showing 57% compliance Average use of 6 hours on days used AutoSet 5-15 Residual AHI of 0.7  Assessment:  Mild obstructive sleep apnea Adequately treated with CPAP therapy  Improvement in daytime symptoms  Past history of asthma - On Advair   History of hypertension  History of diabetes - Compliant with treatment  Plan/Recommendations: Encouraged to use CPAP nightly  Put measures in place that remind him to use his machine on a regular basis  Encouraged to continue graded activities as tolerated  Follow-up in about 4 months from here   Myer Artis MD [facility] 06/28/2023, 9:30 AM  CC: Tonna Frederic,*

## 2023-07-26 ENCOUNTER — Telehealth: Payer: Self-pay | Admitting: Family Medicine

## 2023-07-26 MED ORDER — PIOGLITAZONE HCL 15 MG PO TABS: 15.0000 mg | ORAL_TABLET | Freq: Every day | ORAL | 3 refills | Status: AC

## 2023-07-26 NOTE — Telephone Encounter (Signed)
 Copied from CRM [id number]. Topic: Clinical - Medication Refill >> Jul 26, 2023  8:17 AM Clyde Darling P wrote: Medication: pioglitazone  (ACTOS ) 15 MG tablet  Has the patient contacted their pharmacy? Yes- No refills left  (Agent: If no, request that the patient contact the pharmacy for the refill. If patient does not wish to contact the pharmacy document the reason why and proceed with request.) (Agent: If yes, when and what did the pharmacy advise?)  This is the patient's preferred pharmacy:  [facility] [address], [place] - [address] [place] [address] Phone: [phone] Fax: [phone]   Is this the correct pharmacy for this prescription? Yes If no, delete pharmacy and type the correct one.   Has the prescription been filled recently? No  Is the patient out of the medication? Yes- has 2 pills left   Has the patient been seen for an appointment in the last year OR does the patient have an upcoming appointment? Yes  Can we respond through MyChart? Yes  Agent: Please be advised that Rx refills may take up to 3 business days. We ask that you follow-up with your pharmacy.

## 2023-07-28 DIAGNOSIS — G4733 Obstructive sleep apnea (adult) (pediatric): Secondary | ICD-10-CM | POA: Diagnosis not present

## 2023-08-08 ENCOUNTER — Ambulatory Visit: Payer: Self-pay

## 2023-08-08 NOTE — Telephone Encounter (Signed)
 FYI Only or Action Required?: FYI only for provider.  Patient was last seen in primary care on 05/31/2023 by Berneta Elsie Sayre, MD. Called Nurse Triage reporting Back Pain. Symptoms began several days ago. Interventions attempted: OTC medications: ibuprofen with relief. Symptoms are: unchanged.  Triage Disposition: See PCP When Office is Open (Within 3 Days)  Patient/caregiver understands and will follow disposition?: Yes     Copied from CRM [id number]. Topic: Clinical - Red Word Triage >> Aug 08, 2023  8:27 AM [place] E wrote: [facility] that prompted transfer to Nurse Triage: Pulled muscle in back. Right side/ middle back has pain with movement, rated pain 8/10. Sitting down it is okay. Pain has been going on for 4 days. Reason for Disposition  [1] MODERATE back pain (e.g., interferes with normal activities) AND [2] present > 3 days  Answer Assessment - Initial Assessment Questions 1. ONSET: When did the pain begin?      4 days  2. LOCATION: Where does it hurt? (upper, mid or lower back)     Right, middle back 3. SEVERITY: How bad is the pain?  (e.g., Scale 1-10; mild, moderate, or severe)   - MILD (1-3): Doesn't interfere with normal activities.    - MODERATE (4-7): Interferes with normal activities or awakens from sleep.    - SEVERE (8-10): Excruciating pain, unable to do any normal activities.      8-9/10 Ibuprofen with some relief 4. PATTERN: Is the pain constant? (e.g., yes, no; constant, intermittent)      Intermittent with certain positions 5. RADIATION: Does the pain shoot into your legs or somewhere else?     denies 6. CAUSE:  What do you think is causing the back pain?      unknown 7. BACK OVERUSE:  Any recent lifting of heavy objects, strenuous work or exercise?     denies 8. MEDICINES: What have you taken so far for the pain? (e.g., nothing, acetaminophen , NSAIDS)     Ibuprofen with some relief 9. NEUROLOGIC SYMPTOMS: Do you have any weakness,  numbness, or problems with bowel/bladder control?     denies 10. OTHER SYMPTOMS: Do you have any other symptoms? (e.g., fever, abdomen pain, burning with urination, blood in urine)       denies 11. PREGNANCY: Is there any chance you are pregnant? When was your last menstrual period?       N/a  Protocols used: Back Pain-A-AH

## 2023-08-09 ENCOUNTER — Encounter: Payer: Self-pay | Admitting: Family Medicine

## 2023-08-09 ENCOUNTER — Ambulatory Visit (INDEPENDENT_AMBULATORY_CARE_PROVIDER_SITE_OTHER): Admitting: Family Medicine

## 2023-08-09 VITALS — BP 122/72 | HR 68 | Temp 98.0°F | Ht 71.0 in | Wt 235.0 lb

## 2023-08-09 DIAGNOSIS — S29019A Strain of muscle and tendon of unspecified wall of thorax, initial encounter: Secondary | ICD-10-CM | POA: Diagnosis not present

## 2023-08-09 MED ORDER — MELOXICAM 7.5 MG PO TABS: 7.5000 mg | ORAL_TABLET | Freq: Every day | ORAL | 0 refills | Status: DC

## 2023-08-09 NOTE — Progress Notes (Signed)
 Established Patient Office Visit   Subjective:  Patient ID: Edward Hawkins, male    DOB: 01-08-1951  Age: 73 y.o. MRN: [id number]  Chief Complaint  Patient presents with   Back Pain    Started a week ago sitting long period of time hurts when get up right side taking ibuprofen don't help    Back Pain Pertinent negatives include no abdominal pain, chest pain, dysuria, tingling or weakness.   Encounter Diagnoses  Name Primary?   Thoracic myofascial strain, initial encounter Yes   1 week history of pain and discomfort in his right mid back area.  It is worsened by twisting and turning.  No specific injury.  No cough.  No history of kidney stones or hematuria.  Has not noted any muscle spasms.  He feels that sometimes when he turns the wrong way in bed.   Review of Systems  Constitutional: Negative.   HENT: Negative.    Eyes:  Negative for blurred vision, discharge and redness.  Respiratory: Negative.  Negative for cough.   Cardiovascular: Negative.  Negative for chest pain.  Gastrointestinal:  Negative for abdominal pain.  Genitourinary: Negative.  Negative for dysuria, frequency, hematuria and urgency.  Musculoskeletal:  Positive for back pain. Negative for myalgias.  Skin:  Negative for rash.  Neurological:  Negative for tingling, loss of consciousness and weakness.  Endo/Heme/Allergies:  Negative for polydipsia.     Current Outpatient Medications:    aspirin  325 MG tablet, Take 325 mg by mouth daily., Disp: , Rfl:    atorvastatin  (LIPITOR) 40 MG tablet, Take 1 tablet by mouth once daily, Disp: 90 tablet, Rfl: 0   Cholecalciferol  50 MCG (2000 UT) CAPS, Take 2,000 Units daily by mouth., Disp: , Rfl:    diltiazem  (CARDIZEM  LA) 180 MG 24 hr tablet, Take 1 tablet (180 mg total) by mouth daily., Disp: 30 tablet, Rfl: 3   fish oil-omega-3 fatty acids 1000 MG capsule, Take 1 g by mouth daily., Disp: , Rfl:    fluticasone -salmeterol (ADVAIR ) 250-50 MCG/ACT AEPB, INHALE 1 DOSE  BY MOUTH ONCE DAILY, Disp: 60 each, Rfl: 5   ibuprofen (ADVIL) 200 MG tablet, Take 600 mg by mouth every 6 (six) hours as needed for mild pain or moderate pain., Disp: , Rfl:    Iron , Ferrous Sulfate , 325 (65 Fe) MG TABS, Take 325 mg by mouth every other day., Disp: 45 tablet, Rfl: 2   meloxicam (MOBIC) 7.5 MG tablet, Take 1 tablet (7.5 mg total) by mouth daily., Disp: 30 tablet, Rfl: 0   metFORMIN  (GLUCOPHAGE -XR) 500 MG 24 hr tablet, TAKE 2 TABLETS BY MOUTH ONCE DAILY WITH BREAKFAST, Disp: 180 tablet, Rfl: 3   montelukast  (SINGULAIR ) 10 MG tablet, TAKE 1 TABLET BY MOUTH IN THE MORNING, Disp: 90 tablet, Rfl: 0   Multiple Vitamin (MULTIVITAMIN) tablet, Take 1 tablet by mouth daily. , Disp: , Rfl:    pioglitazone  (ACTOS ) 15 MG tablet, Take 1 tablet (15 mg total) by mouth daily., Disp: 90 tablet, Rfl: 3   traMADol  (ULTRAM ) 50 MG tablet, TAKE 1 TO 2 TABLETS BY MOUTH AT BEDTIME AS NEEDED, Disp: 135 tablet, Rfl: 0   valsartan  (DIOVAN ) 160 MG tablet, Take 1 tablet by mouth once daily, Disp: 90 tablet, Rfl: 2   benzonatate  (TESSALON ) 200 MG capsule, Take 1 capsule (200 mg total) by mouth 2 (two) times daily as needed for cough. (Patient not taking: Reported on 08/09/2023), Disp: 20 capsule, Rfl: 0   Objective:     BP  122/72 (BP Location: Right Arm, Patient Position: Sitting, Cuff Size: Normal)   Pulse 68   Temp 98 F (36.7 C) (Temporal)   Ht 5' 11 (1.803 m)   Wt 235 lb (106.6 kg)   SpO2 97%   BMI 32.78 kg/m    Physical Exam Constitutional:      General: He is not in acute distress.    Appearance: Normal appearance. He is not ill-appearing, toxic-appearing or diaphoretic.  HENT:     Head: Normocephalic and atraumatic.     Right Ear: External ear normal.     Left Ear: External ear normal.   Eyes:     General: No scleral icterus.       Right eye: No discharge.        Left eye: No discharge.     Extraocular Movements: Extraocular movements intact.     Conjunctiva/sclera: Conjunctivae  normal.    Cardiovascular:     Rate and Rhythm: Normal rate and regular rhythm.  Pulmonary:     Effort: Pulmonary effort is normal. No respiratory distress.     Breath sounds: No wheezing, rhonchi or rales.  Abdominal:     Tenderness: There is no right CVA tenderness or left CVA tenderness.   Musculoskeletal:     Thoracic back: No spasms, tenderness or bony tenderness. Normal range of motion.   Skin:    General: Skin is warm and dry.   Neurological:     Mental Status: He is alert and oriented to person, place, and time.   Psychiatric:        Mood and Affect: Mood normal.        Behavior: Behavior normal.      No results found for any visits on 08/09/23.    The ASCVD Risk score (Arnett DK, et al., 2019) failed to calculate for the following reasons:   Risk score cannot be calculated because patient has a medical history suggesting prior/existing ASCVD    Assessment & Plan:   Thoracic myofascial strain, initial encounter -     Meloxicam; Take 1 tablet (7.5 mg total) by mouth daily.  Dispense: 30 tablet; Refill: 0    Return if symptoms worsen or fail to improve.  Could not provide a urine sample today.  If not improving will check urinalysis and possibly abdominal ultrasound and x-rays of his thoracic back.  Information on thoracic strain was given.  Trial of meloxicam.  Elsie Sim Lent, MD

## 2023-08-11 ENCOUNTER — Other Ambulatory Visit: Payer: Self-pay | Admitting: Family Medicine

## 2023-08-15 ENCOUNTER — Ambulatory Visit (INDEPENDENT_AMBULATORY_CARE_PROVIDER_SITE_OTHER)
Admission: RE | Admit: 2023-08-15 | Discharge: 2023-08-15 | Disposition: A | Discharge status: Home / Self Care | Source: Ambulatory Visit | Attending: Family Medicine | Admitting: Family Medicine

## 2023-08-15 ENCOUNTER — Ambulatory Visit: Payer: Self-pay | Admitting: *Deleted

## 2023-08-15 ENCOUNTER — Encounter: Payer: Self-pay | Admitting: Family Medicine

## 2023-08-15 ENCOUNTER — Ambulatory Visit (INDEPENDENT_AMBULATORY_CARE_PROVIDER_SITE_OTHER): Admitting: Family Medicine

## 2023-08-15 VITALS — BP 138/66 | HR 73 | Temp 97.3°F | Ht 71.0 in | Wt 234.2 lb

## 2023-08-15 DIAGNOSIS — M25551 Pain in right hip: Secondary | ICD-10-CM | POA: Diagnosis not present

## 2023-08-15 DIAGNOSIS — M545 Low back pain, unspecified: Secondary | ICD-10-CM

## 2023-08-15 DIAGNOSIS — M549 Dorsalgia, unspecified: Secondary | ICD-10-CM | POA: Diagnosis not present

## 2023-08-15 DIAGNOSIS — I700 Atherosclerosis of aorta: Secondary | ICD-10-CM | POA: Diagnosis not present

## 2023-08-15 DIAGNOSIS — M1611 Unilateral primary osteoarthritis, right hip: Secondary | ICD-10-CM | POA: Diagnosis not present

## 2023-08-15 DIAGNOSIS — M5136 Other intervertebral disc degeneration, lumbar region with discogenic back pain only: Secondary | ICD-10-CM | POA: Diagnosis not present

## 2023-08-15 DIAGNOSIS — M48061 Spinal stenosis, lumbar region without neurogenic claudication: Secondary | ICD-10-CM | POA: Diagnosis not present

## 2023-08-15 MED ORDER — PREDNISONE 10 MG (21) PO TBPK: ORAL_TABLET | ORAL | 0 refills | Status: DC

## 2023-08-15 NOTE — Telephone Encounter (Signed)
 Copied from CRM [id number]. Topic: Clinical - Red Word Triage >> Aug 15, 2023  8:02 AM Robinson H wrote: Red Word that prompted transfer to Nurse Triage: Pain in right hip since Saturday, seen provider last week for pain in back on right side Reason for Disposition  [1] MODERATE pain (e.g., interferes with normal activities, limping) AND [2] present > 3 days  Answer Assessment - Initial Assessment Questions 1. LOCATION and RADIATION: Where is the pain located?      I'm having pain in right hip since Sat.   My dog got excited and jumped on me.   I was laying down and he jumped on me.   He was scarred of the fireworks.  I saw Dr. Berneta last week for my back.   It's all on the right hand side. 2. QUALITY: What does the pain feel like?  (e.g., sharp, dull, aching, burning)     If sit down and getting up it hurts.   I'm walking. 3. SEVERITY: How bad is the pain? What does it keep you from doing?   (Scale 1-10; or mild, moderate, severe)   -  MILD (1-3): doesn't interfere with normal activities    -  MODERATE (4-7): interferes with normal activities (e.g., work or school) or awakens from sleep, limping    -  SEVERE (8-10): excruciating pain, unable to do any normal activities, unable to walk     Moderate 4. ONSET: When did the pain start? Does it come and go, or is it there all the time?     Sat evening 5. WORK OR EXERCISE: Has there been any recent work or exercise that involved this part of the body?      No falls and injuries.   My dog jumped on me Sat. 6. CAUSE: What do you think is causing the hip pain?      See above 7. AGGRAVATING FACTORS: What makes the hip pain worse? (e.g., walking, climbing stairs, running)     Moving around 8. OTHER SYMPTOMS: Do you have any other symptoms? (e.g., back pain, pain shooting down leg,  fever, rash)     I was seen last week for back pain.  Protocols used: Hip Pain-A-AH FYI Only or Action Required?: FYI only for provider.  Patient was  last seen in primary care on 08/09/2023 by Berneta Elsie Sayre, MD. Called Nurse Triage reporting Hip Pain. Symptoms began several days ago. Interventions attempted: Rest, hydration, or home remedies and Ice/heat application. Symptoms are: gradually worsening.  Triage Disposition: See PCP When Office is Open (Within 3 Days)  Patient/caregiver understands and will follow disposition?: Yes

## 2023-08-15 NOTE — Progress Notes (Signed)
 Established Patient Office Visit   Subjective:  Patient ID: Edward Hawkins, male    DOB: 06/01/1950  Age: 73 y.o. MRN: [id number]  Chief Complaint  Patient presents with   Hip Pain    Right hip pain x 2 days. Unable to stay still for too long.    Hip Pain  Pertinent negatives include no tingling.   Encounter Diagnoses  Name Primary?   Acute right-sided low back pain without sciatica Yes   Right hip pain    Pain in the right upper back area is all but resolved.  Now with 2-day history of pain and stiffness in the right lower back and hip area.  Pain is nonradiating.  Denies numbness tingling or weakness in the lower extremities.  No bowel or bladder incontinence.  No injury.  Denies increased physical activity.  Again no particular injury.  No spasms.   Review of Systems  Constitutional: Negative.   HENT: Negative.    Eyes:  Negative for blurred vision, discharge and redness.  Respiratory: Negative.    Cardiovascular: Negative.   Gastrointestinal:  Negative for abdominal pain.  Genitourinary: Negative.   Musculoskeletal:  Positive for back pain and joint pain. Negative for myalgias.  Skin:  Negative for rash.  Neurological:  Negative for tingling, loss of consciousness and weakness.  Endo/Heme/Allergies:  Negative for polydipsia.     Current Outpatient Medications:    aspirin  325 MG tablet, Take 325 mg by mouth daily., Disp: , Rfl:    atorvastatin  (LIPITOR) 40 MG tablet, Take 1 tablet by mouth once daily, Disp: 90 tablet, Rfl: 0   benzonatate  (TESSALON ) 200 MG capsule, Take 1 capsule (200 mg total) by mouth 2 (two) times daily as needed for cough., Disp: 20 capsule, Rfl: 0   Cholecalciferol  50 MCG (2000 UT) CAPS, Take 2,000 Units daily by mouth., Disp: , Rfl:    diltiazem  (CARDIZEM  LA) 180 MG 24 hr tablet, Take 1 tablet (180 mg total) by mouth daily., Disp: 30 tablet, Rfl: 3   fish oil-omega-3 fatty acids 1000 MG capsule, Take 1 g by mouth daily., Disp: , Rfl:     fluticasone -salmeterol (ADVAIR ) 250-50 MCG/ACT AEPB, INHALE 1 DOSE BY MOUTH ONCE DAILY, Disp: 60 each, Rfl: 5   ibuprofen (ADVIL) 200 MG tablet, Take 600 mg by mouth every 6 (six) hours as needed for mild pain or moderate pain., Disp: , Rfl:    Iron , Ferrous Sulfate , 325 (65 Fe) MG TABS, Take 325 mg by mouth every other day., Disp: 45 tablet, Rfl: 2   meloxicam  (MOBIC ) 7.5 MG tablet, Take 1 tablet (7.5 mg total) by mouth daily., Disp: 30 tablet, Rfl: 0   metFORMIN  (GLUCOPHAGE -XR) 500 MG 24 hr tablet, TAKE 2 TABLETS BY MOUTH ONCE DAILY WITH BREAKFAST, Disp: 180 tablet, Rfl: 3   montelukast  (SINGULAIR ) 10 MG tablet, TAKE 1 TABLET BY MOUTH IN THE MORNING, Disp: 90 tablet, Rfl: 0   Multiple Vitamin (MULTIVITAMIN) tablet, Take 1 tablet by mouth daily. , Disp: , Rfl:    pioglitazone  (ACTOS ) 15 MG tablet, Take 1 tablet (15 mg total) by mouth daily., Disp: 90 tablet, Rfl: 3   predniSONE  (STERAPRED UNI-PAK 21 TAB) 10 MG (21) TBPK tablet, Take 6 today, 5 tomorrow, 4 the next day and then 3, 2, 1 and stop, Disp: 21 tablet, Rfl: 0   traMADol  (ULTRAM ) 50 MG tablet, TAKE 1 TO 2 TABLETS BY MOUTH AT BEDTIME AS NEEDED, Disp: 135 tablet, Rfl: 0   valsartan  (DIOVAN ) 160 MG tablet,  Take 1 tablet by mouth once daily, Disp: 90 tablet, Rfl: 2   Objective:     BP 138/66 (Cuff Size: Large)   Pulse 73   Temp (!) 97.3 F (36.3 C) (Temporal)   Ht 5' 11 (1.803 m)   Wt 234 lb 3.2 oz (106.2 kg)   SpO2 96%   BMI 32.66 kg/m    Physical Exam Constitutional:      General: He is not in acute distress.    Appearance: Normal appearance. He is not ill-appearing, toxic-appearing or diaphoretic.  HENT:     Head: Normocephalic and atraumatic.     Right Ear: External ear normal.     Left Ear: External ear normal.  Eyes:     General: No scleral icterus.       Right eye: No discharge.        Left eye: No discharge.     Extraocular Movements: Extraocular movements intact.     Conjunctiva/sclera: Conjunctivae normal.   Pulmonary:     Effort: Pulmonary effort is normal. No respiratory distress.  Musculoskeletal:     Thoracic back: Normal range of motion.     Lumbar back: No tenderness or bony tenderness. Decreased range of motion. Negative right straight leg raise test and negative left straight leg raise test.     Right hip: Normal strength.     Left hip: Normal.       Legs:     Comments: Flexion to 6 inches.  Decreased extension and rotation.  Skin:    General: Skin is warm and dry.  Neurological:     Mental Status: He is alert and oriented to person, place, and time.     Motor: No weakness.  Psychiatric:        Mood and Affect: Mood normal.        Behavior: Behavior normal.      No results found for any visits on 08/15/23.    The ASCVD Risk score (Arnett DK, et al., 2019) failed to calculate for the following reasons:   Risk score cannot be calculated because patient has a medical history suggesting prior/existing ASCVD    Assessment & Plan:   Acute right-sided low back pain without sciatica -     DG Lumbar Spine Complete; Future -     predniSONE ; Take 6 today, 5 tomorrow, 4 the next day and then 3, 2, 1 and stop  Dispense: 21 tablet; Refill: 0  Right hip pain -     DG HIP UNILAT W OR W/O PELVIS 2-3 VIEWS RIGHT; Future -     predniSONE ; Take 6 today, 5 tomorrow, 4 the next day and then 3, 2, 1 and stop  Dispense: 21 tablet; Refill: 0    Return in about 2 weeks (around 08/29/2023).  Hold meloxicam  while taking prednisone .  Elsie Sim Lent, MD

## 2023-08-18 ENCOUNTER — Ambulatory Visit: Payer: Self-pay | Admitting: Family Medicine

## 2023-09-01 ENCOUNTER — Encounter: Payer: Self-pay | Admitting: Family Medicine

## 2023-09-01 ENCOUNTER — Ambulatory Visit (INDEPENDENT_AMBULATORY_CARE_PROVIDER_SITE_OTHER): Admitting: Family Medicine

## 2023-09-01 VITALS — BP 126/76 | HR 71 | Temp 97.6°F | Ht 71.0 in | Wt 234.8 lb

## 2023-09-01 DIAGNOSIS — M545 Low back pain, unspecified: Secondary | ICD-10-CM

## 2023-09-01 DIAGNOSIS — E611 Iron deficiency: Secondary | ICD-10-CM | POA: Diagnosis not present

## 2023-09-01 DIAGNOSIS — M25551 Pain in right hip: Secondary | ICD-10-CM

## 2023-09-01 LAB — CBC
HCT: 35.1 % — ABNORMAL LOW (ref 39.0–52.0)
Hemoglobin: 11.8 g/dL — ABNORMAL LOW (ref 13.0–17.0)
MCHC: 33.7 g/dL (ref 30.0–36.0)
MCV: 98.7 fl (ref 78.0–100.0)
Platelets: 257 K/uL (ref 150.0–400.0)
RBC: 3.55 Mil/uL — ABNORMAL LOW (ref 4.22–5.81)
RDW: 13.4 % (ref 11.5–15.5)
WBC: 6.3 K/uL (ref 4.0–10.5)

## 2023-09-01 NOTE — Progress Notes (Signed)
 Established Patient Office Visit   Subjective:  Patient ID: Edward Hawkins, male    DOB: [birth year]  Age: 73 y.o. MRN: [id number]  Chief Complaint  Patient presents with   Hip Pain    2 week follow up. Pt states hip pain is much better. Per pt prednisone  helped a lot. Still experience some back pain but not as bad as before. No other concerns.     Hip Pain  Pertinent negatives include no tingling.   Encounter Diagnoses  Name Primary?   Midline low back pain without sciatica, unspecified chronicity Yes   Iron  deficiency    Follow-up up hip and lower back pain.  Hip is much improved.  Lower back pain is improved but there is some lingering mild pain.  This is controlled well with the meloxicam .  Discussed x-ray results that showed mild arthritic changes.  Continues with iron  sulfate 325 mg every other day.   Review of Systems  Constitutional: Negative.   HENT: Negative.    Eyes:  Negative for blurred vision, discharge and redness.  Respiratory: Negative.    Cardiovascular: Negative.   Gastrointestinal:  Negative for abdominal pain.  Genitourinary: Negative.   Musculoskeletal:  Positive for back pain. Negative for myalgias.  Skin:  Negative for rash.  Neurological:  Negative for tingling, loss of consciousness and weakness.  Endo/Heme/Allergies:  Negative for polydipsia.     Current Outpatient Medications:    aspirin  325 MG tablet, Take 325 mg by mouth daily., Disp: , Rfl:    atorvastatin  (LIPITOR) 40 MG tablet, Take 1 tablet by mouth once daily, Disp: 90 tablet, Rfl: 0   Cholecalciferol  50 MCG (2000 UT) CAPS, Take 2,000 Units daily by mouth., Disp: , Rfl:    diltiazem  (CARDIZEM  LA) 180 MG 24 hr tablet, Take 1 tablet (180 mg total) by mouth daily., Disp: 30 tablet, Rfl: 3   fish oil-omega-3 fatty acids 1000 MG capsule, Take 1 g by mouth daily., Disp: , Rfl:    fluticasone -salmeterol (ADVAIR ) 250-50 MCG/ACT AEPB, INHALE 1 DOSE BY MOUTH ONCE DAILY, Disp: 60 each, Rfl: 5    ibuprofen (ADVIL) 200 MG tablet, Take 600 mg by mouth every 6 (six) hours as needed for mild pain or moderate pain., Disp: , Rfl:    Iron , Ferrous Sulfate , 325 (65 Fe) MG TABS, Take 325 mg by mouth every other day., Disp: 45 tablet, Rfl: 2   meloxicam  (MOBIC ) 7.5 MG tablet, Take 1 tablet (7.5 mg total) by mouth daily., Disp: 30 tablet, Rfl: 0   metFORMIN  (GLUCOPHAGE -XR) 500 MG 24 hr tablet, TAKE 2 TABLETS BY MOUTH ONCE DAILY WITH BREAKFAST, Disp: 180 tablet, Rfl: 3   montelukast  (SINGULAIR ) 10 MG tablet, TAKE 1 TABLET BY MOUTH IN THE MORNING, Disp: 90 tablet, Rfl: 0   Multiple Vitamin (MULTIVITAMIN) tablet, Take 1 tablet by mouth daily. , Disp: , Rfl:    pioglitazone  (ACTOS ) 15 MG tablet, Take 1 tablet (15 mg total) by mouth daily., Disp: 90 tablet, Rfl: 3   traMADol  (ULTRAM ) 50 MG tablet, TAKE 1 TO 2 TABLETS BY MOUTH AT BEDTIME AS NEEDED, Disp: 135 tablet, Rfl: 0   valsartan  (DIOVAN ) 160 MG tablet, Take 1 tablet by mouth once daily, Disp: 90 tablet, Rfl: 2   Objective:     BP 126/76 (Cuff Size: Large)   Pulse 71   Temp 97.6 F (36.4 C) (Temporal)   Ht 5' 11 (1.803 m)   Wt 234 lb 12.8 oz (106.5 kg)   SpO2 97%  BMI 32.75 kg/m    Physical Exam Constitutional:      General: He is not in acute distress.    Appearance: Normal appearance. He is not ill-appearing, toxic-appearing or diaphoretic.  HENT:     Head: Normocephalic and atraumatic.     Right Ear: External ear normal.     Left Ear: External ear normal.  Eyes:     General: No scleral icterus.       Right eye: No discharge.        Left eye: No discharge.     Extraocular Movements: Extraocular movements intact.     Conjunctiva/sclera: Conjunctivae normal.  Pulmonary:     Effort: Pulmonary effort is normal. No respiratory distress.  Skin:    General: Skin is warm and dry.  Neurological:     Mental Status: He is alert and oriented to person, place, and time.  Psychiatric:        Mood and Affect: Mood normal.         Behavior: Behavior normal.      No results found for any visits on 09/01/23.    The ASCVD Risk score (Arnett DK, et al., 2019) failed to calculate for the following reasons:   Risk score cannot be calculated because patient has a medical history suggesting prior/existing ASCVD    Assessment & Plan:   Midline low back pain without sciatica, unspecified chronicity  Iron  deficiency -     CBC -     Iron , TIBC and Ferritin Panel    Return Has follow-up appointment in October..  Continue meloxicam  as needed.  Recommended physical therapy if pain persists past the next few weeks or so.  Rechecking iron  and CBC.  Has physical scheduled for October otherwise follow-up as needed  Elsie Sim Lent, MD

## 2023-09-02 ENCOUNTER — Ambulatory Visit: Payer: Self-pay | Admitting: Family Medicine

## 2023-09-02 LAB — IRON,TIBC AND FERRITIN PANEL
%SAT: 52 % — ABNORMAL HIGH (ref 20–48)
Ferritin: 30 ng/mL (ref 24–380)
Iron: 181 ug/dL — ABNORMAL HIGH (ref 50–180)
TIBC: 348 ug/dL (ref 250–425)

## 2023-09-12 ENCOUNTER — Other Ambulatory Visit: Payer: Self-pay | Admitting: Family Medicine

## 2023-09-12 DIAGNOSIS — J4520 Mild intermittent asthma, uncomplicated: Secondary | ICD-10-CM

## 2023-09-12 DIAGNOSIS — G8929 Other chronic pain: Secondary | ICD-10-CM

## 2023-09-14 ENCOUNTER — Other Ambulatory Visit: Payer: Self-pay | Admitting: Family Medicine

## 2023-09-14 DIAGNOSIS — G8929 Other chronic pain: Secondary | ICD-10-CM

## 2023-09-14 NOTE — Telephone Encounter (Unsigned)
 Copied from CRM #[id number]. Topic: Clinical - Prescription Issue >> Sep 14, 2023  3:50 PM Franky GRADE wrote: Reason for CRM: [place] from [facility] pharmacy is calling because they cannot accept a faxed script for traMADol  (ULTRAM ) 50 MG tablet [[id number]], it needs to be resent as an e-script.

## 2023-09-14 NOTE — Telephone Encounter (Unsigned)
 Copied from CRM #[id number]. Topic: Clinical - Prescription Issue >> Sep 14, 2023 12:43 PM Edward Hawkins I wrote: Reason for CRM: Patient traMADol  (ULTRAM ) 50 MG tablet was approved but [facility] pharmacy is stating they still have not received the refill for this prescription

## 2023-09-15 MED ORDER — TRAMADOL HCL 50 MG PO TABS
50.0000 mg | ORAL_TABLET | Freq: Every evening | ORAL | 0 refills | Status: DC | PRN
Start: 2023-09-15 — End: 2023-12-15

## 2023-09-15 NOTE — Addendum Note (Signed)
 Addended by: BERNETA ELSIE LABOR on: 09/15/2023 08:59 AM   Modules accepted: Orders

## 2023-09-22 ENCOUNTER — Other Ambulatory Visit: Payer: Self-pay | Admitting: Family Medicine

## 2023-09-22 DIAGNOSIS — E785 Hyperlipidemia, unspecified: Secondary | ICD-10-CM

## 2023-09-23 DIAGNOSIS — H18711 Corneal ectasia, right eye: Secondary | ICD-10-CM | POA: Diagnosis not present

## 2023-09-23 DIAGNOSIS — E119 Type 2 diabetes mellitus without complications: Secondary | ICD-10-CM | POA: Diagnosis not present

## 2023-09-23 DIAGNOSIS — Z961 Presence of intraocular lens: Secondary | ICD-10-CM | POA: Diagnosis not present

## 2023-09-23 DIAGNOSIS — H40013 Open angle with borderline findings, low risk, bilateral: Secondary | ICD-10-CM | POA: Diagnosis not present

## 2023-09-23 DIAGNOSIS — H26493 Other secondary cataract, bilateral: Secondary | ICD-10-CM | POA: Diagnosis not present

## 2023-10-05 ENCOUNTER — Ambulatory Visit: Payer: Self-pay

## 2023-10-05 ENCOUNTER — Encounter: Payer: Self-pay | Admitting: Internal Medicine

## 2023-10-05 ENCOUNTER — Ambulatory Visit: Admitting: Internal Medicine

## 2023-10-05 DIAGNOSIS — S39012A Strain of muscle, fascia and tendon of lower back, initial encounter: Secondary | ICD-10-CM

## 2023-10-05 MED ORDER — MELOXICAM 7.5 MG PO TABS: 7.5000 mg | ORAL_TABLET | Freq: Every day | ORAL | 0 refills | Status: DC | PRN

## 2023-10-05 NOTE — Progress Notes (Signed)
 [facility] [address] [place] [address] Dept: [phone] Dept Fax: [phone]  Acute Care Office Visit  Subjective:   Edward Hawkins 14-Sep-1950 10/05/2023  Chief Complaint  Patient presents with   Hip Pain    Started on Monday after wrong step     HPI:  Discussed the use of AI scribe software for clinical note transcription with the patient, who gave verbal consent to proceed.  History of Present Illness   Edward Hawkins is a 73 year old male who presents with left-sided lower back pain.  He has been experiencing sudden onset of back pain since Monday, which began while walking in [place]. The pain is described as 'annoying' and is primarily localized to the left side without radiation. There is no history of specific injury or fall that could have caused the pain.  He has been taking ibuprofen, which provides some relief, and notes that the pain is less severe after moving around. However, he experiences increased pain in the morning upon waking, which improves as he becomes more active throughout the day. He reports sleeping well at night without disturbance from the pain.  No numbness, tingling, or incontinence. There are no urinary symptoms such as burning during urination, and he has not experienced any fever.   He previously used meloxicam , which he found effective, but he has run out of it. He also takes tramadol  at night for leg pain, which he plans to continue using.      The following portions of the patient's history were reviewed and updated as appropriate: past medical history, past surgical history, family history, social history, allergies, medications, and problem list.   Patient Active Problem List   Diagnosis Date Noted   Thoracic myofascial strain 08/09/2023   OSA (obstructive sleep apnea) 03/31/2022   Prediabetes 02/24/2022   Snoring 01/22/2022   Snores 01/08/2022   Syncope  12/27/2021   History of CVA (cerebrovascular accident) 12/27/2021   Need for influenza vaccination 11/08/2019   Abnormal TSH 04/04/2019   Muscle spasm 10/09/2018   URI (upper respiratory infection) 09/21/2018   Primary localized osteoarthritis of left knee 12/28/2016   S/P knee replacement 12/28/2016   Back pain 12/11/2016   Macrocytic anemia 11/01/2016   Hypocalcemia 11/01/2016   Fever 03/31/2016   Preop examination 05/17/2014   Healthcare maintenance 09/29/2012   Routine general medical examination at a health care facility 09/28/2010   Chronic pain of left knee 09/28/2010   Cerebral artery occlusion with cerebral infarction (HCC) 11/11/2009   RECTAL BLEEDING 09/15/2009   Dyslipidemia 10/08/2006   Essential hypertension 10/08/2006   Asthma 10/08/2006   Past Medical History:  Diagnosis Date   Arthritis    ASTHMA 10/08/2006   Asthma    last flareup more than 10 yrs ago.   Cataract    extractions   Colon polyps    tubular adenomas   CVA 10/10/2005   DIABETES MELLITUS, TYPE II 10/08/2006   Diverticulosis    HYPERLIPIDEMIA 10/08/2006   HYPERTENSION 10/08/2006   Primary localized osteoarthritis of left knee 12/28/2016   RECTAL BLEEDING 09/15/2009   S/P LASIK surgery of both eyes    Seasonal allergies    Past Surgical History:  Procedure Laterality Date   CATARACT EXTRACTION, BILATERAL     COLONOSCOPY     EYE SURGERY     FRACTURE SURGERY     KNEE SURGERY     left knee x2   PARTIAL KNEE ARTHROPLASTY Left 12/28/2016  Procedure: LEFT UNICOMPARTMENTAL KNEE;  Surgeon: Josefina Chew, MD;  Location: [facility] OR;  Service: Orthopedics;  Laterality: Left;   RADIOLOGY WITH ANESTHESIA N/A 10/28/2016   Procedure: MRI - CERVICAL SPINE WITHOUT;  Surgeon: Radiologist, Medication, MD;  Location: [facility] OR;  Service: Radiology;  Laterality: N/A;   Stress Cardiolite  11/05/2005   TRANSTHORACIC ECHOCARDIOGRAM  10/12/2005   Family History  Problem Relation Age of Onset   Diabetes Mother     Diabetes Father    Cancer Brother        Lung Cancer   Cancer Daughter        Breast Cancer   Colon cancer Neg Hx    Colon polyps Neg Hx    Esophageal cancer Neg Hx    Rectal cancer Neg Hx    Stomach cancer Neg Hx     Current Outpatient Medications:    aspirin  325 MG tablet, Take 325 mg by mouth daily., Disp: , Rfl:    atorvastatin  (LIPITOR) 40 MG tablet, Take 1 tablet by mouth once daily, Disp: 90 tablet, Rfl: 0   Cholecalciferol  50 MCG (2000 UT) CAPS, Take 2,000 Units daily by mouth., Disp: , Rfl:    diltiazem  (CARDIZEM  LA) 180 MG 24 hr tablet, Take 1 tablet (180 mg total) by mouth daily., Disp: 30 tablet, Rfl: 3   fish oil-omega-3 fatty acids 1000 MG capsule, Take 1 g by mouth daily., Disp: , Rfl:    fluticasone -salmeterol (ADVAIR ) 250-50 MCG/ACT AEPB, Inhale 1 puff by mouth once daily, Disp: 60 each, Rfl: 0   ibuprofen (ADVIL) 200 MG tablet, Take 600 mg by mouth every 6 (six) hours as needed for mild pain or moderate pain., Disp: , Rfl:    Iron , Ferrous Sulfate , 325 (65 Fe) MG TABS, Take 325 mg by mouth every other day., Disp: 45 tablet, Rfl: 2   metFORMIN  (GLUCOPHAGE -XR) 500 MG 24 hr tablet, TAKE 2 TABLETS BY MOUTH ONCE DAILY WITH BREAKFAST, Disp: 180 tablet, Rfl: 3   montelukast  (SINGULAIR ) 10 MG tablet, TAKE 1 TABLET BY MOUTH IN THE MORNING, Disp: 90 tablet, Rfl: 0   Multiple Vitamin (MULTIVITAMIN) tablet, Take 1 tablet by mouth daily. , Disp: , Rfl:    pioglitazone  (ACTOS ) 15 MG tablet, Take 1 tablet (15 mg total) by mouth daily., Disp: 90 tablet, Rfl: 3   traMADol  (ULTRAM ) 50 MG tablet, Take 1-2 tablets (50-100 mg total) by mouth at bedtime as needed., Disp: 135 tablet, Rfl: 0   valsartan  (DIOVAN ) 160 MG tablet, Take 1 tablet by mouth once daily, Disp: 90 tablet, Rfl: 0   meloxicam  (MOBIC ) 7.5 MG tablet, Take 1 tablet (7.5 mg total) by mouth daily as needed for pain., Disp: 30 tablet, Rfl: 0 No Known Allergies   ROS: A complete ROS was performed with pertinent  positives/negatives noted in the HPI. The remainder of the ROS are negative.    Objective:   Today's Vitals   10/05/23 1333  BP: 138/70  Pulse: 70  Temp: 98.8 F (37.1 C)  TempSrc: Temporal  SpO2: 99%  Weight: 238 lb 3.2 oz (108 kg)  Height: 5' 11 (1.803 m)    GENERAL: Well-appearing, in NAD. Well nourished.  SKIN: Pink, warm and dry.  NECK: Trachea midline. Full ROM w/o pain or tenderness.  RESPIRATORY: Chest wall symmetrical. Respirations even and non-labored.  CARDIAC:  Peripheral pulses 2+ bilaterally.  MSK: Muscle tone and strength appropriate for age. Pain reproduced with flexion, extension, and left rotation  EXTREMITIES: Without clubbing, cyanosis, or edema.  NEUROLOGIC: No sensory deficits. Steady, even gait.  PSYCH/MENTAL STATUS: Alert, oriented x 3. Cooperative, appropriate mood and affect.    No results found for any visits on 10/05/23.    Assessment & Plan:  Assessment and Plan    Left-sided back muscle strain - Prescribe meloxicam  7.5mg  Po daily PRN.  - Recommend rest, avoid overexertion. - Suggest alternating heat and ice application. - Allow use of Icy Hot or OTC lidocaine  patches for additional pain relief. - Advise against concurrent use of meloxicam  and ibuprofen. - Confirm tramadol  can be taken with meloxicam .  Meds ordered this encounter  Medications   meloxicam  (MOBIC ) 7.5 MG tablet    Sig: Take 1 tablet (7.5 mg total) by mouth daily as needed for pain.    Dispense:  30 tablet    Refill:  0    Supervising Provider:   THOMPSON, AARON B [[id number]]   No orders of the defined types were placed in this encounter.  Lab Orders  No laboratory test(s) ordered today   No images are attached to the encounter or orders placed in the encounter.  Return if symptoms worsen or fail to improve.   Rosina Senters, FNP

## 2023-10-05 NOTE — Telephone Encounter (Signed)
 FYI Only or Action Required?: FYI only for provider.  Patient was last seen in primary care on 09/01/2023 by Berneta Elsie Sayre, MD.  Called Nurse Triage reporting Back Pain.  Symptoms began Monday.  Interventions attempted: OTC medications: ibuprofen.  Symptoms are: gradually worsening.  Triage Disposition: See PCP When Office is Open (Within 3 Days)  Patient/caregiver understands and will follow disposition?: Yes   Copied from CRM [id number]. Topic: Clinical - Red Word Triage >> Oct 05, 2023  8:02 AM Robinson H wrote: [facility] that prompted transfer to Nurse Triage: Back pain left side hip area since Monday, had same pain on right side a month ago Reason for Disposition  [1] MODERATE back pain (e.g., interferes with normal activities) AND [2] present > 3 days  Answer Assessment - Initial Assessment Questions 1. ONSET: When did the pain begin? (e.g., minutes, hours, days)     Monday 2. LOCATION: Where does it hurt? (upper, mid or lower back)     Left side back around hip area 3. SEVERITY: How bad is the pain?  (e.g., Scale 1-10; mild, moderate, or severe)     8/10 4. PATTERN: Is the pain constant? (e.g., yes, no; constant, intermittent)      Comes and goes 5. RADIATION: Does the pain shoot into your legs or somewhere else?     no 6. CAUSE:  What do you think is causing the back pain?      unknown 7. BACK OVERUSE:  Any recent lifting of heavy objects, strenuous work or exercise?     no 8. MEDICINES: What have you taken so far for the pain? (e.g., nothing, acetaminophen , NSAIDS)     Ibuprofen  9. NEUROLOGIC SYMPTOMS: Do you have any weakness, numbness, or problems with bowel/bladder control?     no 10. OTHER SYMPTOMS: Do you have any other symptoms? (e.g., fever, abdomen pain, burning with urination, blood in urine)       Abd pain 11. PREGNANCY: Is there any chance you are pregnant? When was your last menstrual period?       na  Protocols used:  Back Pain-A-AH

## 2023-10-05 NOTE — Patient Instructions (Signed)
  VISIT SUMMARY: Today, you were seen for sudden onset left-sided upper back pain that started while walking in [place]. The pain is localized to the left side without radiation. You have been taking ibuprofen with some relief, and the pain improves with movement but is worse in the morning. There are no other concerning symptoms such as numbness, tingling, or fever.  YOUR PLAN: -LEFT-SIDED BACK MUSCLE STRAIN: You have an acute left-sided back muscle strain, likely from a sudden movement. This means that the muscles in your back are strained, causing pain. We will prescribe meloxicam  (30 tablets) to be take once daily as needed for pain.  You should take it in the morning after eating for avoid upset stomach. if you Alternate ice and heating pad. Rest and avoid overexertion. You can also use Icy Hot or over-the-counter lidocaine  patches for additional pain relief. Do not use ibuprofen while taking Meloxicam . You can continue taking tramadol  with meloxicam .                      Contains text generated by [employer]bridge.                                 Contains text generated by Abridge.

## 2023-10-26 ENCOUNTER — Encounter: Payer: Self-pay | Admitting: Pulmonary Disease

## 2023-10-26 ENCOUNTER — Ambulatory Visit: Admitting: Pulmonary Disease

## 2023-10-26 VITALS — BP 142/75 | HR 87 | Ht 71.0 in | Wt 236.0 lb

## 2023-10-26 DIAGNOSIS — J4540 Moderate persistent asthma, uncomplicated: Secondary | ICD-10-CM

## 2023-10-26 DIAGNOSIS — J45909 Unspecified asthma, uncomplicated: Secondary | ICD-10-CM

## 2023-10-26 DIAGNOSIS — G4733 Obstructive sleep apnea (adult) (pediatric): Secondary | ICD-10-CM | POA: Diagnosis not present

## 2023-10-26 NOTE — Progress Notes (Signed)
 Edward Hawkins    [id number]    20-Feb-1950  Primary Care Physician:Kremer, Elsie Sayre, MD  Referring Physician: Berneta Elsie Sayre, MD [address] [place],  [place] [address]  Chief complaint:   Patient with mild obstructive sleep apnea on CPAP therapy  HPI:  Continues to use CPAP on a nightly basis  Not having any problems with his CPAP  Waking up feeling rested  Occasionally still feels tired during the day where he needs to take a nap, may nap for about an hour Sometimes early afternoon  Does describe significant improvement in daytime symptoms  He continues to try to stay active  Asthma seems well-controlled, does take his Advair , has only been using it about once a day No need for rescue inhalers  Outpatient Encounter Medications as of 10/26/2023  Medication Sig   aspirin  325 MG tablet Take 325 mg by mouth daily.   atorvastatin  (LIPITOR) 40 MG tablet Take 1 tablet by mouth once daily   Cholecalciferol  50 MCG (2000 UT) CAPS Take 2,000 Units daily by mouth.   diltiazem  (CARDIZEM  LA) 180 MG 24 hr tablet Take 1 tablet (180 mg total) by mouth daily.   fish oil-omega-3 fatty acids 1000 MG capsule Take 1 g by mouth daily.   fluticasone -salmeterol (ADVAIR ) 250-50 MCG/ACT AEPB Inhale 1 puff by mouth once daily   ibuprofen (ADVIL) 200 MG tablet Take 600 mg by mouth every 6 (six) hours as needed for mild pain or moderate pain.   Iron , Ferrous Sulfate , 325 (65 Fe) MG TABS Take 325 mg by mouth every other day.   meloxicam  (MOBIC ) 7.5 MG tablet Take 1 tablet (7.5 mg total) by mouth daily as needed for pain.   metFORMIN  (GLUCOPHAGE -XR) 500 MG 24 hr tablet TAKE 2 TABLETS BY MOUTH ONCE DAILY WITH BREAKFAST   montelukast  (SINGULAIR ) 10 MG tablet TAKE 1 TABLET BY MOUTH IN THE MORNING   Multiple Vitamin (MULTIVITAMIN) tablet Take 1 tablet by mouth daily.    pioglitazone  (ACTOS ) 15 MG tablet Take 1 tablet (15 mg total) by mouth daily.   traMADol  (ULTRAM )  50 MG tablet Take 1-2 tablets (50-100 mg total) by mouth at bedtime as needed.   valsartan  (DIOVAN ) 160 MG tablet Take 1 tablet by mouth once daily   No facility-administered encounter medications on file as of 10/26/2023.    Allergies as of 10/26/2023   (No Known Allergies)    Past Medical History:  Diagnosis Date   Arthritis    ASTHMA 10/08/2006   Asthma    last flareup more than 10 yrs ago.   Cataract    extractions   Colon polyps    tubular adenomas   CVA 10/10/2005   DIABETES MELLITUS, TYPE II 10/08/2006   Diverticulosis    HYPERLIPIDEMIA 10/08/2006   HYPERTENSION 10/08/2006   Primary localized osteoarthritis of left knee 12/28/2016   RECTAL BLEEDING 09/15/2009   S/P LASIK surgery of both eyes    Seasonal allergies     Past Surgical History:  Procedure Laterality Date   CATARACT EXTRACTION, BILATERAL     COLONOSCOPY     EYE SURGERY     FRACTURE SURGERY     KNEE SURGERY     left knee x2   PARTIAL KNEE ARTHROPLASTY Left 12/28/2016   Procedure: LEFT UNICOMPARTMENTAL KNEE;  Surgeon: Josefina Chew, MD;  Location: [facility] OR;  Service: Orthopedics;  Laterality: Left;   RADIOLOGY WITH ANESTHESIA N/A 10/28/2016   Procedure: MRI -  CERVICAL SPINE WITHOUT;  Surgeon: Radiologist, Medication, MD;  Location: [facility] OR;  Service: Radiology;  Laterality: N/A;   Stress Cardiolite  11/05/2005   TRANSTHORACIC ECHOCARDIOGRAM  10/12/2005    Family History  Problem Relation Age of Onset   Diabetes Mother    Diabetes Father    Cancer Brother        Lung Cancer   Cancer Daughter        Breast Cancer   Colon cancer Neg Hx    Colon polyps Neg Hx    Esophageal cancer Neg Hx    Rectal cancer Neg Hx    Stomach cancer Neg Hx     Social History   Socioeconomic History   Marital status: Single    Spouse name: Not on file   Number of children: Not on file   Years of education: Not on file   Highest education level: 11th grade  Occupational History   Occupation: Works at [employer]   Tobacco Use   Smoking status: Never   Smokeless tobacco: Never  Vaping Use   Vaping status: Never Used  Substance and Sexual Activity   Alcohol use: No   Drug use: No   Sexual activity: Yes  Other Topics Concern   Not on file  Social History Narrative   Are you right handed or left handed? Right handed   Are you currently employed ? Part time    What is your current occupation?   Do you live at home alone?no    Who lives with you? Family    What type of home do you live in: 1 story or 2 story?  2 story        Social Drivers of Corporate investment banker Strain: Low Risk  (08/08/2023)   Overall Financial Resource Strain (CARDIA)    Difficulty of Paying Living Expenses: Not hard at all  Food Insecurity: No Food Insecurity (08/08/2023)   Hunger Vital Sign    Worried About Running Out of Food in the Last Year: Never true    Ran Out of Food in the Last Year: Never true  Transportation Needs: No Transportation Needs (08/08/2023)   PRAPARE - Administrator, Civil Service (Medical): No    Lack of Transportation (Non-Medical): No  Physical Activity: Unknown (08/08/2023)   Exercise Vital Sign    Days of Exercise per Week: 3 days    Minutes of Exercise per Session: Not on file  Recent Concern: Physical Activity - Insufficiently Active (05/24/2023)   Exercise Vital Sign    Days of Exercise per Week: 7 days    Minutes of Exercise per Session: 20 min  Stress: No Stress Concern Present (08/08/2023)   [employer] of Occupational Health - Occupational Stress Questionnaire    Feeling of Stress: Not at all  Social Connections: Moderately Isolated (08/08/2023)   Social Connection and Isolation Panel    Frequency of Communication with Friends and Family: Three times a week    Frequency of Social Gatherings with Friends and Family: Twice a week    Attends Religious Services: Never    Database administrator or Organizations: No    Attends Engineer, structural: Not on  file    Marital Status: Living with partner  Intimate Partner Violence: Not At Risk (02/28/2023)   Humiliation, Afraid, Rape, and Kick questionnaire    Fear of Current or Ex-Partner: No    Emotionally Abused: No    Physically Abused: No  Sexually Abused: No    Review of Systems  Respiratory:  Positive for apnea.   Psychiatric/Behavioral:  Positive for sleep disturbance.     Vitals:   10/26/23 0853  BP: (!) 142/75  Pulse: 87  SpO2: 99%     Physical Exam Constitutional:      Appearance: He is obese.  HENT:     Head: Normocephalic.     Mouth/Throat:     Mouth: Mucous membranes are moist.  Eyes:     General: No scleral icterus. Cardiovascular:     Rate and Rhythm: Normal rate and regular rhythm.     Heart sounds: No murmur heard.    No friction rub.  Pulmonary:     Effort: No respiratory distress.     Breath sounds: No stridor. No wheezing or rhonchi.  Musculoskeletal:     Cervical back: No rigidity or tenderness.  Neurological:     Mental Status: He is alert.  Psychiatric:        Mood and Affect: Mood normal.    Data Reviewed: Compliance shows 83% compliance - AutoSet 5-15 Residual AHI of 0.4  Initial sleep study did show AHI of 12.8  Assessment:   Mild obstructive sleep apnea - Adequately treated with CPAP therapy - Continues to benefit from CPAP use  History of asthma - Controlled with Advair   History of hypertension - Compliant with treatment  History of diabetes Compliant with treatment  Plan/Recommendations: Encouraged to continue CPAP nightly  Continue management for asthma  Call us  with significant concerns  Follow-up in about 6 months  If okay at 6 months, can go to yearly visits   Jennet Epley MD [facility] 10/26/2023, 9:00 AM  CC: Berneta Elsie Sayre,*

## 2023-10-26 NOTE — Patient Instructions (Signed)
 Continue using your CPAP on a nightly basis  Download from the machine shows that is working well  Continue asthma medications  Call us  with significant concerns  Follow-up in about 6 months

## 2023-11-09 ENCOUNTER — Other Ambulatory Visit: Payer: Self-pay | Admitting: Family Medicine

## 2023-11-09 DIAGNOSIS — J4520 Mild intermittent asthma, uncomplicated: Secondary | ICD-10-CM

## 2023-11-10 DIAGNOSIS — G4733 Obstructive sleep apnea (adult) (pediatric): Secondary | ICD-10-CM | POA: Diagnosis not present

## 2023-11-14 ENCOUNTER — Other Ambulatory Visit: Payer: Self-pay | Admitting: Family Medicine

## 2023-11-25 ENCOUNTER — Other Ambulatory Visit: Payer: Self-pay | Admitting: Family Medicine

## 2023-12-02 ENCOUNTER — Encounter: Payer: Self-pay | Admitting: Family Medicine

## 2023-12-02 ENCOUNTER — Ambulatory Visit: Admitting: Family Medicine

## 2023-12-02 VITALS — BP 134/82 | HR 84 | Temp 98.5°F | Ht 71.0 in | Wt 234.0 lb

## 2023-12-02 DIAGNOSIS — E611 Iron deficiency: Secondary | ICD-10-CM

## 2023-12-02 DIAGNOSIS — J4520 Mild intermittent asthma, uncomplicated: Secondary | ICD-10-CM

## 2023-12-02 DIAGNOSIS — Z0000 Encounter for general adult medical examination without abnormal findings: Secondary | ICD-10-CM | POA: Diagnosis not present

## 2023-12-02 DIAGNOSIS — E785 Hyperlipidemia, unspecified: Secondary | ICD-10-CM | POA: Diagnosis not present

## 2023-12-02 DIAGNOSIS — R351 Nocturia: Secondary | ICD-10-CM

## 2023-12-02 DIAGNOSIS — Z125 Encounter for screening for malignant neoplasm of prostate: Secondary | ICD-10-CM | POA: Diagnosis not present

## 2023-12-02 DIAGNOSIS — E119 Type 2 diabetes mellitus without complications: Secondary | ICD-10-CM

## 2023-12-02 DIAGNOSIS — I10 Essential (primary) hypertension: Secondary | ICD-10-CM | POA: Diagnosis not present

## 2023-12-02 LAB — CBC WITH DIFFERENTIAL/PLATELET
Basophils Absolute: 0.1 K/uL (ref 0.0–0.1)
Basophils Relative: 0.7 % (ref 0.0–3.0)
Eosinophils Absolute: 0.2 K/uL (ref 0.0–0.7)
Eosinophils Relative: 2.2 % (ref 0.0–5.0)
HCT: 36.2 % — ABNORMAL LOW (ref 39.0–52.0)
Hemoglobin: 12.1 g/dL — ABNORMAL LOW (ref 13.0–17.0)
Lymphocytes Relative: 14.6 % (ref 12.0–46.0)
Lymphs Abs: 1.2 K/uL (ref 0.7–4.0)
MCHC: 33.5 g/dL (ref 30.0–36.0)
MCV: 99.1 fl (ref 78.0–100.0)
Monocytes Absolute: 0.7 K/uL (ref 0.1–1.0)
Monocytes Relative: 8.3 % (ref 3.0–12.0)
Neutro Abs: 5.9 K/uL (ref 1.4–7.7)
Neutrophils Relative %: 74.2 % (ref 43.0–77.0)
Platelets: 307 K/uL (ref 150.0–400.0)
RBC: 3.65 Mil/uL — ABNORMAL LOW (ref 4.22–5.81)
RDW: 13.5 % (ref 11.5–15.5)
WBC: 8 K/uL (ref 4.0–10.5)

## 2023-12-02 LAB — LIPID PANEL
Cholesterol: 145 mg/dL (ref 0–200)
HDL: 42.1 mg/dL (ref >39.00)
LDL Cholesterol: 88 mg/dL (ref 0–99)
NonHDL: 102.73
Total CHOL/HDL Ratio: 3
Triglycerides: 74 mg/dL (ref 0.0–149.0)
VLDL: 14.8 mg/dL (ref 0.0–40.0)

## 2023-12-02 LAB — URINALYSIS, ROUTINE W REFLEX MICROSCOPIC
Bilirubin Urine: NEGATIVE
Hgb urine dipstick: NEGATIVE
Ketones, ur: NEGATIVE
Leukocytes,Ua: NEGATIVE
Nitrite: NEGATIVE
Specific Gravity, Urine: 1.02 (ref 1.000–1.030)
Total Protein, Urine: NEGATIVE
Urine Glucose: NEGATIVE
Urobilinogen, UA: 0.2 (ref 0.0–1.0)
pH: 6 (ref 5.0–8.0)

## 2023-12-02 LAB — MICROALBUMIN / CREATININE URINE RATIO
Creatinine,U: 157.5 mg/dL
Microalb Creat Ratio: UNDETERMINED mg/g (ref 0.0–30.0)
Microalb, Ur: <0.7 mg/dL

## 2023-12-02 LAB — COMPREHENSIVE METABOLIC PANEL WITH GFR
ALT: 15 U/L (ref 0–53)
AST: 17 U/L (ref 0–37)
Albumin: 4.4 g/dL (ref 3.5–5.2)
Alkaline Phosphatase: 94 U/L (ref 39–117)
BUN: 27 mg/dL — ABNORMAL HIGH (ref 6–23)
CO2: 23 meq/L (ref 19–32)
Calcium: 8.9 mg/dL (ref 8.4–10.5)
Chloride: 104 meq/L (ref 96–112)
Creatinine, Ser: 1.35 mg/dL (ref 0.40–1.50)
GFR: 51.98 mL/min — ABNORMAL LOW (ref >60.00)
Glucose, Bld: 114 mg/dL — ABNORMAL HIGH (ref 70–99)
Potassium: 4.2 meq/L (ref 3.5–5.1)
Sodium: 138 meq/L (ref 135–145)
Total Bilirubin: 0.4 mg/dL (ref 0.2–1.2)
Total Protein: 7.3 g/dL (ref 6.0–8.3)

## 2023-12-02 LAB — PSA: PSA: 0.79 ng/mL (ref 0.10–4.00)

## 2023-12-02 LAB — HM DIABETES EYE EXAM

## 2023-12-02 LAB — HEMOGLOBIN A1C: Hgb A1c MFr Bld: 6.5 % (ref 4.6–6.5)

## 2023-12-02 MED ORDER — FLUTICASONE-SALMETEROL 250-50 MCG/ACT IN AEPB: INHALATION_SPRAY | RESPIRATORY_TRACT | 0 refills | Status: AC

## 2023-12-02 MED ORDER — ATORVASTATIN CALCIUM 40 MG PO TABS: 40.0000 mg | ORAL_TABLET | Freq: Every day | ORAL | 0 refills | Status: AC

## 2023-12-02 MED ORDER — VALSARTAN 160 MG PO TABS: 160.0000 mg | ORAL_TABLET | Freq: Every day | ORAL | 0 refills | Status: DC

## 2023-12-02 MED ORDER — DILTIAZEM HCL ER 180 MG PO TB24: 180.0000 mg | ORAL_TABLET | Freq: Every day | ORAL | 3 refills | Status: AC

## 2023-12-02 NOTE — Progress Notes (Signed)
 Established Patient Office Visit   Subjective:  Patient ID: Edward Hawkins, male    DOB: 1950/03/02  Age: 73 y.o. MRN: [id number]  Chief Complaint  Patient presents with   Annual Exam    CPE. Pt is fasting. Eye exam done 10/2023 will request report. Needs foot exam.     HPI Encounter Diagnoses  Name Primary?   Healthcare maintenance Yes   Dyslipidemia    Mild intermittent asthma without complication    Essential hypertension    Type 2 diabetes mellitus without complication, without long-term current use of insulin  (HCC)    Screening for prostate cancer    Nocturia    Iron  deficiency    Here for physical and follow-up of above.  Remains quite active by walking mile 4 times daily with his dog.  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  He has regular dental care.  He is compliant with blood pressure medications.  Has not been checking his pressures recently.  He has been able to lose some weight.  He continues with valsartan  and diltiazem .  Continues atorvastatin  40 mg for elevated cholesterol and history of diabetes.  Continues metformin  1000 mg daily with breakfast and Actos  15 mg for diabetes.  Continues tramadol  for chronic right knee pain he takes before bed.  Continues Advair  for well-controlled asthma.  Experiences nocturia when he drinks fluids just before bedtime.  Urine stream is strong otherwise.   Review of Systems  Constitutional: Negative.   HENT: Negative.    Eyes:  Negative for blurred vision, discharge and redness.  Respiratory: Negative.    Cardiovascular: Negative.   Gastrointestinal:  Negative for abdominal pain.  Genitourinary: Negative.   Musculoskeletal: Negative.  Negative for myalgias.  Skin:  Negative for rash.  Neurological:  Negative for tingling, loss of consciousness and weakness.  Endo/Heme/Allergies:  Negative for polydipsia.      12/02/2023    8:43 AM 10/05/2023    1:33 PM 09/01/2023    9:02 AM  Depression screen PHQ  2/9  Decreased Interest 0 0 0  Down, Depressed, Hopeless 0 0 0  PHQ - 2 Score 0 0 0  Altered sleeping 0    Tired, decreased energy 0    Change in appetite 0    Feeling bad or failure about yourself  0    Trouble concentrating 0    Moving slowly or fidgety/restless 0    Suicidal thoughts 0    PHQ-9 Score 0    Difficult doing work/chores Not difficult at all         Current Outpatient Medications:    aspirin  325 MG tablet, Take 325 mg by mouth daily., Disp: , Rfl:    Cholecalciferol  50 MCG (2000 UT) CAPS, Take 2,000 Units daily by mouth., Disp: , Rfl:    fish oil-omega-3 fatty acids 1000 MG capsule, Take 1 g by mouth daily., Disp: , Rfl:    ibuprofen (ADVIL) 200 MG tablet, Take 600 mg by mouth every 6 (six) hours as needed for mild pain or moderate pain., Disp: , Rfl:    Iron , Ferrous Sulfate , 325 (65 Fe) MG TABS, Take 325 mg by mouth every other day., Disp: 45 tablet, Rfl: 2   metFORMIN  (GLUCOPHAGE -XR) 500 MG 24 hr tablet, TAKE 2 TABLETS BY MOUTH ONCE DAILY WITH BREAKFAST, Disp: 180 tablet, Rfl: 0   montelukast  (SINGULAIR ) 10 MG tablet, TAKE 1 TABLET BY MOUTH IN THE MORNING, Disp: 90 tablet, Rfl: 0   Multiple Vitamin (  MULTIVITAMIN) tablet, Take 1 tablet by mouth daily. , Disp: , Rfl:    pioglitazone  (ACTOS ) 15 MG tablet, Take 1 tablet (15 mg total) by mouth daily., Disp: 90 tablet, Rfl: 3   traMADol  (ULTRAM ) 50 MG tablet, Take 1-2 tablets (50-100 mg total) by mouth at bedtime as needed., Disp: 135 tablet, Rfl: 0   atorvastatin  (LIPITOR) 40 MG tablet, Take 1 tablet (40 mg total) by mouth daily., Disp: 90 tablet, Rfl: 0   diltiazem  (CARDIZEM  LA) 180 MG 24 hr tablet, Take 1 tablet (180 mg total) by mouth daily., Disp: 90 tablet, Rfl: 3   fluticasone -salmeterol (ADVAIR ) 250-50 MCG/ACT AEPB, INHALE 1 DOSE BY MOUTH ONCE DAILY, Disp: 60 each, Rfl: 0   valsartan  (DIOVAN ) 160 MG tablet, Take 1 tablet (160 mg total) by mouth daily., Disp: 90 tablet, Rfl: 0   Objective:     BP (!) 142/82 (BP  Location: Right Arm, Patient Position: Sitting, Cuff Size: Normal)   Pulse 84   Temp 98.5 F (36.9 C) (Temporal)   Ht 5' 11 (1.803 m)   Wt 234 lb (106.1 kg)   SpO2 100%   BMI 32.64 kg/m  BP Readings from Last 3 Encounters:  12/02/23 (!) 142/82  10/26/23 (!) 142/75  10/05/23 138/70   Wt Readings from Last 3 Encounters:  12/02/23 234 lb (106.1 kg)  10/26/23 236 lb (107 kg)  10/05/23 238 lb 3.2 oz (108 kg)      Physical Exam Constitutional:      General: He is not in acute distress.    Appearance: Normal appearance. He is not ill-appearing, toxic-appearing or diaphoretic.  HENT:     Head: Normocephalic and atraumatic.     Right Ear: Tympanic membrane, ear canal and external ear normal.     Left Ear: Tympanic membrane, ear canal and external ear normal.     Mouth/Throat:     Mouth: Mucous membranes are moist.     Pharynx: Oropharynx is clear. No oropharyngeal exudate or posterior oropharyngeal erythema.  Eyes:     General: No scleral icterus.       Right eye: No discharge.        Left eye: No discharge.     Extraocular Movements: Extraocular movements intact.     Conjunctiva/sclera: Conjunctivae normal.     Pupils: Pupils are equal, round, and reactive to light.  Cardiovascular:     Rate and Rhythm: Normal rate and regular rhythm.     Pulses:          Dorsalis pedis pulses are 2+ on the right side and 2+ on the left side.       Posterior tibial pulses are 1+ on the right side and 1+ on the left side.  Pulmonary:     Effort: Pulmonary effort is normal. No respiratory distress.     Breath sounds: Normal breath sounds. No wheezing or rales.  Abdominal:     General: Bowel sounds are normal.     Tenderness: There is no abdominal tenderness. There is no guarding or rebound.  Musculoskeletal:     Cervical back: No rigidity or tenderness.  Skin:    General: Skin is warm and dry.  Neurological:     Mental Status: He is alert and oriented to person, place, and time.   Psychiatric:        Mood and Affect: Mood normal.        Behavior: Behavior normal.    Diabetic Foot Exam - Simple   Simple  Foot Form Diabetic Foot exam was performed with the following findings: Yes 12/02/2023  9:16 AM  Visual Inspection See comments: Yes Sensation Testing Intact to touch and monofilament testing bilaterally: Yes Pulse Check Posterior Tibialis and Dorsalis pulse intact bilaterally: Yes Comments Feet are cavus bilaterally.  There are no lesions or ulcerations.       No results found for any visits on 12/02/23.    The ASCVD Risk score (Arnett DK, et al., 2019) failed to calculate for the following reasons:   Risk score cannot be calculated because patient has a medical history suggesting prior/existing ASCVD    Assessment & Plan:   Healthcare maintenance  Dyslipidemia -     Comprehensive metabolic panel with GFR -     Lipid panel -     Atorvastatin  Calcium ; Take 1 tablet (40 mg total) by mouth daily.  Dispense: 90 tablet; Refill: 0  Mild intermittent asthma without complication -     Fluticasone -Salmeterol; INHALE 1 DOSE BY MOUTH ONCE DAILY  Dispense: 60 each; Refill: 0  Essential hypertension -     CBC with Differential/Platelet -     Comprehensive metabolic panel with GFR -     Urinalysis, Routine w reflex microscopic -     Valsartan ; Take 1 tablet (160 mg total) by mouth daily.  Dispense: 90 tablet; Refill: 0 -     dilTIAZem  HCl ER; Take 1 tablet (180 mg total) by mouth daily.  Dispense: 90 tablet; Refill: 3  Type 2 diabetes mellitus without complication, without long-term current use of insulin  (HCC) -     Comprehensive metabolic panel with GFR -     Urinalysis, Routine w reflex microscopic -     Hemoglobin A1c -     Valsartan ; Take 1 tablet (160 mg total) by mouth daily.  Dispense: 90 tablet; Refill: 0 -     Microalbumin / creatinine urine ratio  Screening for prostate cancer -     PSA  Nocturia -     PSA  Iron  deficiency -     Iron ,  TIBC and Ferritin Panel    Return in about 6 months (around 06/01/2024), or Please check and record your BPs.  Bring your cuff with you for  next visit.  Continue regular exercise and weight loss efforts.  Asked him to check and record his blood pressures.  Information was given on managing hypertension.  Advised him to avoid fluids a couple of hours before bedtime.  Information given on health maintenance and disease prevention.  Elsie Sim Lent, MD

## 2023-12-03 LAB — IRON,TIBC AND FERRITIN PANEL
%SAT: 38 % (ref 20–48)
Ferritin: 38 ng/mL (ref 24–380)
Iron: 136 ug/dL (ref 50–180)
TIBC: 354 ug/dL (ref 250–425)

## 2023-12-05 ENCOUNTER — Encounter: Payer: Self-pay | Admitting: Family Medicine

## 2023-12-05 ENCOUNTER — Ambulatory Visit: Payer: Self-pay | Admitting: Family Medicine

## 2023-12-07 NOTE — Telephone Encounter (Signed)
 Wrong chart

## 2023-12-13 ENCOUNTER — Ambulatory Visit

## 2023-12-13 ENCOUNTER — Other Ambulatory Visit: Payer: Self-pay | Admitting: Family Medicine

## 2023-12-13 DIAGNOSIS — G8929 Other chronic pain: Secondary | ICD-10-CM

## 2023-12-14 ENCOUNTER — Ambulatory Visit (INDEPENDENT_AMBULATORY_CARE_PROVIDER_SITE_OTHER)

## 2023-12-14 DIAGNOSIS — Z23 Encounter for immunization: Secondary | ICD-10-CM | POA: Diagnosis not present

## 2023-12-14 NOTE — Progress Notes (Signed)
 After obtaining consent, and per orders of Dr. Berneta, injection of High Dose Flu Vaccine was given by Armenta Check, CMA in left deltoid. Patient tolerated the injection well. No questions or concerns.

## 2024-02-13 ENCOUNTER — Other Ambulatory Visit: Payer: Self-pay | Admitting: Family Medicine

## 2024-02-22 ENCOUNTER — Other Ambulatory Visit: Payer: Self-pay | Admitting: Family Medicine

## 2024-03-01 ENCOUNTER — Encounter: Admitting: Family Medicine

## 2024-03-02 ENCOUNTER — Ambulatory Visit: Payer: PPO

## 2024-03-05 ENCOUNTER — Ambulatory Visit

## 2024-03-08 ENCOUNTER — Other Ambulatory Visit: Payer: Self-pay | Admitting: Family Medicine

## 2024-03-08 DIAGNOSIS — I10 Essential (primary) hypertension: Secondary | ICD-10-CM

## 2024-03-08 DIAGNOSIS — E119 Type 2 diabetes mellitus without complications: Secondary | ICD-10-CM

## 2024-03-13 ENCOUNTER — Other Ambulatory Visit: Payer: Self-pay | Admitting: Family Medicine

## 2024-03-13 ENCOUNTER — Telehealth: Payer: Self-pay

## 2024-03-13 DIAGNOSIS — D539 Nutritional anemia, unspecified: Secondary | ICD-10-CM

## 2024-03-13 DIAGNOSIS — G8929 Other chronic pain: Secondary | ICD-10-CM

## 2024-03-13 DIAGNOSIS — I10 Essential (primary) hypertension: Secondary | ICD-10-CM

## 2024-03-13 DIAGNOSIS — E119 Type 2 diabetes mellitus without complications: Secondary | ICD-10-CM

## 2024-03-13 NOTE — Telephone Encounter (Signed)
 Copied from CRM [id number]. Topic: Clinical - Medication Refill >> Mar 13, 2024  9:37 AM Eva FALCON wrote: Medication:  diltiazem  (CARDIZEM  LA) 180 MG 24 hr tablet  Has the patient contacted their pharmacy? No, wants to change pharmacy (Agent: If no, request that the patient contact the pharmacy for the refill. If patient does not wish to contact the pharmacy document the reason why and proceed with request.) (Agent: If yes, when and what did the pharmacy advise?)  This is the patient's preferred pharmacy:  [facility] [address] [place] - [address] [place] [address] Phone: [phone] Fax: [phone]  Is this the correct pharmacy for this prescription? Yes If no, delete pharmacy and type the correct one.   Has the prescription been filled recently? no  Is the patient out of the medication? Yes  Has the patient been seen for an appointment in the last year OR does the patient have an upcoming appointment? Yes  Can we respond through MyChart? Yes  Agent: Please be advised that Rx refills may take up to 3 business days. We ask that you follow-up with your pharmacy.

## 2024-04-24 ENCOUNTER — Ambulatory Visit

## 2024-05-14 ENCOUNTER — Ambulatory Visit: Admitting: Pulmonary Disease

## 2024-06-01 ENCOUNTER — Ambulatory Visit: Admitting: Family Medicine
